# Patient Record
Sex: Male | Born: 1935
Health system: Southern US, Community
[De-identification: ages and names within clinical notes are randomized; demographics above are authoritative.]

## PROBLEM LIST (undated history)

## (undated) DIAGNOSIS — N529 Male erectile dysfunction, unspecified: Secondary | ICD-10-CM

## (undated) DIAGNOSIS — J302 Other seasonal allergic rhinitis: Secondary | ICD-10-CM

## (undated) DIAGNOSIS — R7303 Prediabetes: Secondary | ICD-10-CM

## (undated) DIAGNOSIS — F1021 Alcohol dependence, in remission: Secondary | ICD-10-CM

## (undated) DIAGNOSIS — R3912 Poor urinary stream: Secondary | ICD-10-CM

## (undated) DIAGNOSIS — K219 Gastro-esophageal reflux disease without esophagitis: Secondary | ICD-10-CM

## (undated) DIAGNOSIS — G4733 Obstructive sleep apnea (adult) (pediatric): Secondary | ICD-10-CM

## (undated) DIAGNOSIS — E7800 Pure hypercholesterolemia, unspecified: Secondary | ICD-10-CM

## (undated) DIAGNOSIS — R51 Headache: Secondary | ICD-10-CM

## (undated) DIAGNOSIS — M1990 Unspecified osteoarthritis, unspecified site: Secondary | ICD-10-CM

## (undated) DIAGNOSIS — I10 Essential (primary) hypertension: Secondary | ICD-10-CM

## (undated) DIAGNOSIS — R519 Headache, unspecified: Secondary | ICD-10-CM

## (undated) DIAGNOSIS — N401 Enlarged prostate with lower urinary tract symptoms: Secondary | ICD-10-CM

## (undated) DIAGNOSIS — Z9989 Dependence on other enabling machines and devices: Secondary | ICD-10-CM

## (undated) DIAGNOSIS — C61 Malignant neoplasm of prostate: Secondary | ICD-10-CM

## (undated) DIAGNOSIS — Z8739 Personal history of other diseases of the musculoskeletal system and connective tissue: Secondary | ICD-10-CM

## (undated) HISTORY — DX: Headache, unspecified: R51.9

## (undated) HISTORY — PX: PROSTATE BIOPSY: SHX241

## (undated) HISTORY — PX: NO PAST SURGERIES: SHX2092

## (undated) HISTORY — DX: Headache: R51

---

## 1998-04-21 ENCOUNTER — Inpatient Hospital Stay (HOSPITAL_COMMUNITY): Admission: EM | Admit: 1998-04-21 | Discharge: 1998-05-05 | Payer: Self-pay | Admitting: Emergency Medicine

## 1998-05-03 ENCOUNTER — Encounter: Payer: Self-pay | Admitting: Emergency Medicine

## 1998-05-03 ENCOUNTER — Encounter: Payer: Self-pay | Admitting: Cardiology

## 2005-04-02 ENCOUNTER — Ambulatory Visit (HOSPITAL_COMMUNITY): Admission: RE | Admit: 2005-04-02 | Discharge: 2005-04-02 | Payer: Self-pay | Admitting: Gastroenterology

## 2011-05-26 ENCOUNTER — Emergency Department (HOSPITAL_COMMUNITY): Payer: Medicare Other

## 2011-05-26 ENCOUNTER — Emergency Department (HOSPITAL_COMMUNITY)
Admission: EM | Admit: 2011-05-26 | Discharge: 2011-05-26 | Disposition: A | Discharge status: Home / Self Care | Payer: Medicare Other | Attending: Emergency Medicine | Admitting: Emergency Medicine

## 2011-05-26 DIAGNOSIS — S81809A Unspecified open wound, unspecified lower leg, initial encounter: Secondary | ICD-10-CM | POA: Insufficient documentation

## 2011-05-26 DIAGNOSIS — S81009A Unspecified open wound, unspecified knee, initial encounter: Secondary | ICD-10-CM | POA: Insufficient documentation

## 2011-05-26 DIAGNOSIS — S61209A Unspecified open wound of unspecified finger without damage to nail, initial encounter: Secondary | ICD-10-CM | POA: Insufficient documentation

## 2011-05-26 DIAGNOSIS — Z111 Encounter for screening for respiratory tuberculosis: Secondary | ICD-10-CM | POA: Insufficient documentation

## 2011-08-19 ENCOUNTER — Encounter: Payer: Self-pay | Admitting: *Deleted

## 2011-08-19 ENCOUNTER — Telehealth (HOSPITAL_COMMUNITY): Payer: Self-pay | Admitting: Emergency Medicine

## 2011-08-19 ENCOUNTER — Observation Stay (HOSPITAL_COMMUNITY)
Admission: EM | Admit: 2011-08-19 | Discharge: 2011-08-19 | Disposition: A | Discharge status: Home / Self Care | Payer: Medicare Other | Attending: Emergency Medicine | Admitting: Emergency Medicine

## 2011-08-19 ENCOUNTER — Emergency Department (HOSPITAL_COMMUNITY): Payer: Medicare Other

## 2011-08-19 DIAGNOSIS — E7800 Pure hypercholesterolemia, unspecified: Secondary | ICD-10-CM | POA: Insufficient documentation

## 2011-08-19 DIAGNOSIS — M109 Gout, unspecified: Principal | ICD-10-CM | POA: Insufficient documentation

## 2011-08-19 DIAGNOSIS — I10 Essential (primary) hypertension: Secondary | ICD-10-CM | POA: Insufficient documentation

## 2011-08-19 DIAGNOSIS — M2540 Effusion, unspecified joint: Secondary | ICD-10-CM | POA: Insufficient documentation

## 2011-08-19 HISTORY — DX: Essential (primary) hypertension: I10

## 2011-08-19 HISTORY — DX: Pure hypercholesterolemia, unspecified: E78.00

## 2011-08-19 LAB — SYNOVIAL CELL COUNT + DIFF, W/ CRYSTALS
Eosinophils-Synovial: 0 % (ref 0–1)
Lymphocytes-Synovial Fld: 1 % (ref 0–20)
Monocyte-Macrophage-Synovial Fluid: 16 % — ABNORMAL LOW (ref 50–90)
Neutrophil, Synovial: 83 % — ABNORMAL HIGH (ref 0–25)
WBC, Synovial: 17728 /mm3 — ABNORMAL HIGH (ref 0–200)

## 2011-08-19 LAB — BASIC METABOLIC PANEL
BUN: 28 mg/dL — ABNORMAL HIGH (ref 6–23)
CO2: 26 mEq/L (ref 19–32)
Calcium: 9.6 mg/dL (ref 8.4–10.5)
Chloride: 101 mEq/L (ref 96–112)
Creatinine, Ser: 1.51 mg/dL — ABNORMAL HIGH (ref 0.50–1.35)
GFR calc Af Amer: 51 mL/min — ABNORMAL LOW (ref >90)
GFR calc non Af Amer: 44 mL/min — ABNORMAL LOW (ref >90)
Glucose, Bld: 121 mg/dL — ABNORMAL HIGH (ref 70–99)
Potassium: 4.1 mEq/L (ref 3.5–5.1)
Sodium: 138 mEq/L (ref 135–145)

## 2011-08-19 LAB — CBC
HCT: 36.3 % — ABNORMAL LOW (ref 39.0–52.0)
Hemoglobin: 11.5 g/dL — ABNORMAL LOW (ref 13.0–17.0)
MCH: 27.9 pg (ref 26.0–34.0)
MCHC: 31.7 g/dL (ref 30.0–36.0)
MCV: 88.1 fL (ref 78.0–100.0)
Platelets: 308 10*3/uL (ref 150–400)
RBC: 4.12 MIL/uL — ABNORMAL LOW (ref 4.22–5.81)
RDW: 14 % (ref 11.5–15.5)
WBC: 9.8 10*3/uL (ref 4.0–10.5)

## 2011-08-19 LAB — GRAM STAIN

## 2011-08-19 MED ORDER — PREDNISONE 10 MG PO TABS: 50.0000 mg | ORAL_TABLET | Freq: Every day | ORAL | Status: AC

## 2011-08-19 MED ORDER — VANCOMYCIN HCL IN DEXTROSE 1-5 GM/200ML-% IV SOLN
1000.0000 mg | Freq: Once | INTRAVENOUS | Status: AC
  Administered 2011-08-19: 1000 mg via INTRAVENOUS
  Filled 2011-08-19: qty 200

## 2011-08-19 MED ORDER — METHYLPREDNISOLONE SODIUM SUCC 125 MG IJ SOLR
125.0000 mg | Freq: Once | INTRAMUSCULAR | Status: AC
  Administered 2011-08-19: 125 mg via INTRAVENOUS
  Filled 2011-08-19: qty 2

## 2011-08-19 MED ORDER — SODIUM CHLORIDE 0.9 % IV SOLN
999.0000 mL | INTRAVENOUS | Status: DC
  Administered 2011-08-19: 1000 mL via INTRAVENOUS

## 2011-08-19 MED ORDER — CLINDAMYCIN PHOSPHATE 600 MG/50ML IV SOLN: 600.0000 mg | Freq: Once | INTRAVENOUS | Status: DC

## 2011-08-19 MED ORDER — SODIUM CHLORIDE 0.9 % IV SOLN
INTRAVENOUS | Status: DC
  Administered 2011-08-19: 07:00:00 via INTRAVENOUS

## 2011-08-19 MED ORDER — ONDANSETRON HCL 4 MG/2ML IJ SOLN
4.0000 mg | Freq: Four times a day (QID) | INTRAMUSCULAR | Status: DC | PRN
  Administered 2011-08-19: 4 mg via INTRAVENOUS
  Filled 2011-08-19: qty 2

## 2011-08-19 MED ORDER — OXYCODONE-ACETAMINOPHEN 5-325 MG PO TABS: 1.0000 | ORAL_TABLET | Freq: Four times a day (QID) | ORAL | Status: AC | PRN

## 2011-08-19 MED ORDER — MORPHINE SULFATE 4 MG/ML IJ SOLN
4.0000 mg | INTRAMUSCULAR | Status: DC | PRN
  Administered 2011-08-19: 4 mg via INTRAVENOUS
  Filled 2011-08-19: qty 1

## 2011-08-19 MED ORDER — ACETAMINOPHEN 650 MG RE SUPP: 650.0000 mg | RECTAL | Status: DC | PRN

## 2011-08-19 MED ORDER — OXYCODONE-ACETAMINOPHEN 5-325 MG PO TABS
1.0000 | ORAL_TABLET | Freq: Once | ORAL | Status: AC
  Administered 2011-08-19: 1 via ORAL
  Filled 2011-08-19: qty 1

## 2011-08-19 NOTE — ED Notes (Signed)
Pt NAD, resp e/u, AOx4, pt states understanding of discharge instructions and denies pain at time of discharge.

## 2011-08-19 NOTE — ED Notes (Signed)
Patient with left knee swelling and pain.  Patient saw his MD and fluid was aspirated from the knee.  Patient told to come to ED if his knee was still bothering him

## 2011-08-19 NOTE — ED Notes (Signed)
Pt alert, interactive, calm, skin W&D, resps e/u, NAD, speaking in clear complete sentences, friend at Bs. Pt to xray via stretcher, pain med given.

## 2011-08-19 NOTE — ED Notes (Signed)
Meal ordered for pt 

## 2011-08-19 NOTE — ED Notes (Signed)
Family at bedside. Pt sitting on side of bed eating breakfast

## 2011-08-19 NOTE — Progress Notes (Signed)
Observation review completed. 

## 2011-08-19 NOTE — ED Notes (Signed)
Synovial fluid walked to lab by this nurse

## 2011-08-19 NOTE — ED Notes (Signed)
Patient states that he fell and twisted his knee a few days ago which is why he went to see his MD

## 2011-08-19 NOTE — ED Notes (Signed)
Ortho aware to apply knee immobilizer

## 2011-08-19 NOTE — ED Provider Notes (Signed)
History     CSN: [id number] Arrival date & time: 08/19/2011  1:14 AM   First MD Initiated Contact with Patient 08/19/11 0235      Chief Complaint  Patient presents with  . Joint Swelling     Patient is a 76 y.o. male presenting with knee pain.  Knee Pain This is a new problem. The current episode started yesterday. The problem occurs constantly. The problem has been gradually worsening. Associated symptoms include joint swelling. Pertinent negatives include no fever. The symptoms are aggravated by walking, bending and standing.  Knee Pain This is a new problem. The current episode started yesterday. The problem occurs constantly. The problem has been gradually worsening. The symptoms are aggravated by walking, bending and standing.  Patient reports hx of chronic of left knee pain associated with history of gout. States on Tuesday of this week he twisted his knee. The injury was minor and the patient was not evaluated. Patient noted increasing pain and swelling to the left knee. Was seen by Dr. Janae [facility] and patient states in the interim or fluid out of his knee. States pain worsened by Saturday morning, and was associated with increased swelling and redness which was new. Patient is able to bend the knee but states it is very painful.  Past Medical History  Diagnosis Date  . Hypertension   . Gout   . Hypercholesteremia     History reviewed. No pertinent past surgical history.  History reviewed. No pertinent family history.  History  Substance Use Topics  . Smoking status: Never Smoker   . Smokeless tobacco: Not on file  . Alcohol Use: No      Review of Systems  Constitutional: Negative.  Negative for fever.  HENT: Negative.   Eyes: Negative.   Respiratory: Negative.   Cardiovascular: Negative.   Gastrointestinal: Negative.   Genitourinary: Negative.   Musculoskeletal: Positive for joint swelling.  Skin: Negative.   Neurological: Negative.     Hematological: Negative.   Psychiatric/Behavioral: Negative.     Allergies  Review of patient's allergies indicates no known allergies.  Home Medications   Current Outpatient Rx  Name Route Sig Dispense Refill  . AMLODIPINE BESYLATE 5 MG PO TABS Oral Take 5 mg by mouth daily.      . ASPIRIN-SALICYLAMIDE-CAFFEINE 650-195-33.3 MG PO PACK Oral Take 1 packet by mouth daily as needed. For pain     . ATORVASTATIN CALCIUM 40 MG PO TABS Oral Take 40 mg by mouth daily.      [name] DICLOXACILLIN SODIUM 250 MG PO CAPS Oral Take 250 mg by mouth 4 (four) times daily.      [name] ESOMEPRAZOLE MAGNESIUM 40 MG PO CPDR Oral Take 40 mg by mouth daily before breakfast.      . EZETIMIBE-SIMVASTATIN 10-40 MG PO TABS Oral Take 1 tablet by mouth at bedtime.      [name] HYDROCODONE-ACETAMINOPHEN 5-500 MG PO TABS Oral Take 1 tablet by mouth every 6 (six) hours as needed. pain     . OLMESARTAN MEDOXOMIL-HCTZ 40-25 MG PO TABS Oral Take 1 tablet by mouth daily.        BP 138/80  Pulse 67  Temp(Src) 98.3 F (36.8 C) (Oral)  Resp 12  SpO2 97%  Physical Exam  Constitutional: He is oriented to person, place, and time. He appears well-developed and well-nourished.  HENT:  Head: Normocephalic and atraumatic.  Eyes: Conjunctivae are normal.  Neck: Neck supple.  Cardiovascular: Normal rate.   Pulmonary/Chest:  Effort normal.  Musculoskeletal:       Left knee: He exhibits swelling.       Legs: Neurological: He is alert and oriented to person, place, and time.  Skin: Skin is warm and dry.  Psychiatric: He has a normal mood and affect.    ED Course  Procedures Pt reports some relief of pain w/ PO med for pain. (L) knee Xr shows left knee effusion. Discussed patient with Dr. Dierdre Highman. Will consult with orthopedics for plan.  0430:  Spoke with Dr. Shon Baton. He has requested medical admission and agrees that he will be in to see patient this morning to tap patient's left knee. Request antibiotics be held until after procedure.  Discussed plan with Dr. Dierdre Highman. Will place patient in CDU Cellulitis Protocol until morning pending evaluation by Dr Shon Baton. Patient is agreeable with plan  0600:  Pt has rested in no acute distress.     Labs Reviewed - No data to display Dg Knee Complete 4 Views Left  08/19/2011  *RADIOLOGY REPORT*  Clinical Data: Left knee pain and swelling.  Recent knee fluid aspiration.  LEFT KNEE - COMPLETE 4+ VIEW  Comparison: None.  Findings: There is no evidence of fracture or dislocation.  The joint spaces are preserved.  No significant degenerative change is seen; the patellofemoral joint is grossly unremarkable in appearance.  A Pellegrini-Stieda lesion is noted.  A moderate joint effusion is seen.  The visualized soft tissues are otherwise grossly unremarkable in appearance.  IMPRESSION:  1.  No evidence of fracture or dislocation. 2.  Moderate joint effusion noted. 3.  Pellegrini-Stieda lesion noted, likely reflecting remote medial collateral ligament injury.  Original Report Authenticated By: Tonia Ghent, M.D.     No diagnosis found.    MDM  Cellulitis vs septic (L) knee joint (Pending eval by ortho this am.      Leanne Chang, NP 08/19/11 [phone]  Medical screening examination/treatment/procedure(s) were conducted as a shared visit with non-physician practitioner(s) and myself.  I personally evaluated the patient during the encounter. Has red tender knee that appears cellulitic s/p arthrocentesis, pending ortho eval  Sunnie Nielsen, MD 08/19/11 [phone]

## 2011-08-19 NOTE — ED Notes (Signed)
Dr Shon Baton at bedside to aspirate left knee

## 2011-08-19 NOTE — Consult Note (Signed)
No primary provider on file. Chief Complaint: right knee pain   History: 76 yo old male with increasing knee pain and swelling for several days.  Patient had knee aspirated Friday at his PCP's office.  Reports increased pain since aspiration.  Denies fevers/chills.  Pain with ROM but not excruciating.  Asked to see patient for question of septic joint.  Past Medical History  Diagnosis Date  . Hypertension   . Gout   . Hypercholesteremia     No Known Allergies  No current facility-administered medications on file prior to encounter.   No current outpatient prescriptions on file prior to encounter.    Physical Exam: Filed Vitals:   08/19/11 0735  BP:   Pulse: 65  Temp: 98.2 F (36.8 C)  Resp: 20  No SOB/CP Abd - soft/NT NVI - 2+ DP/PT pulses EHL/TA/GA intact bilateral Compartments soft/NT Pain with PROM/AROM  No erythema.  Positive swelling   Image: Dg Knee Complete 4 Views Left  08/19/2011  *RADIOLOGY REPORT*  Clinical Data: Left knee pain and swelling.  Recent knee fluid aspiration.  LEFT KNEE - COMPLETE 4+ VIEW  Comparison: None.  Findings: There is no evidence of fracture or dislocation.  The joint spaces are preserved.  No significant degenerative change is seen; the patellofemoral joint is grossly unremarkable in appearance.  A Pellegrini-Stieda lesion is noted.  A moderate joint effusion is seen.  The visualized soft tissues are otherwise grossly unremarkable in appearance.  IMPRESSION:  1.  No evidence of fracture or dislocation. 2.  Moderate joint effusion noted. 3.  Pellegrini-Stieda lesion noted, likely reflecting remote medial collateral ligament injury.  Original Report Authenticated By: Tonia Ghent, M.D.    A/P: Under sterile conditions the knee was aspirated.  Collected approximately 50 cc of blood tinged synovial fluid.  No gross pus.   Decreased pain after aspiration.   Plan:  Ok to start IV Abx Knee immobilizer and ace wrap for comfort WBAT Await  results of aspiration - ? Gout.  Doesn't appear to be septic joint. Medical team to admit.

## 2011-08-19 NOTE — ED Provider Notes (Signed)
7:22 AM Patient is in CDU this morning under observation, cellulitis protocol.  Pt reports his pain is well controlled as long as he doesn't attempt to walk or straighten his left leg.  On exam, pt is A&Ox4, NAD, RRR, CTAB, left knee edematous, erythematous, warm, ttp, decreased extension, distal pulses intact.  Plan is for Dr Shon Baton to see patient this morning and aspirate joint, concern for septic joint.    Patient seen in ED, joint aspirated by Dr Shon Baton.  Discussed gram stain results with Dr Shon Baton.  Plan is for d/c home with treatment for gout.  Pt to follow up with PCP Hayden Rasmussen, NP.    Pt is aware of results and agrees with plan.  He states he has gout flares approximately once a month and they seem to be getting worse.  I have advised that he discussed medications for better control with his PCP when he is not having an acute flare.  Pt verbalizes understanding.    Dillard Cannon [place], [place] 08/19/11 1639  Medical screening examination/treatment/procedure(s) were conducted as a shared visit with non-physician practitioner(s) and myself.  I personally evaluated the patient during the encounter.   Sunnie Nielsen, MD 08/20/11 [phone]

## 2011-08-22 LAB — BODY FLUID CULTURE: Culture: NO GROWTH

## 2011-09-17 DIAGNOSIS — Z8739 Personal history of other diseases of the musculoskeletal system and connective tissue: Secondary | ICD-10-CM

## 2011-09-17 HISTORY — DX: Personal history of other diseases of the musculoskeletal system and connective tissue: Z87.39

## 2011-10-03 ENCOUNTER — Emergency Department (HOSPITAL_COMMUNITY)
Admission: EM | Admit: 2011-10-03 | Discharge: 2011-10-03 | Disposition: A | Discharge status: Home / Self Care | Payer: Medicare Other | Attending: Emergency Medicine | Admitting: Emergency Medicine

## 2011-10-03 ENCOUNTER — Encounter (HOSPITAL_COMMUNITY): Payer: Self-pay | Admitting: Emergency Medicine

## 2011-10-03 DIAGNOSIS — E785 Hyperlipidemia, unspecified: Secondary | ICD-10-CM | POA: Insufficient documentation

## 2011-10-03 DIAGNOSIS — I10 Essential (primary) hypertension: Secondary | ICD-10-CM | POA: Insufficient documentation

## 2011-10-03 DIAGNOSIS — M7989 Other specified soft tissue disorders: Secondary | ICD-10-CM | POA: Insufficient documentation

## 2011-10-03 DIAGNOSIS — M25579 Pain in unspecified ankle and joints of unspecified foot: Secondary | ICD-10-CM | POA: Insufficient documentation

## 2011-10-03 DIAGNOSIS — M109 Gout, unspecified: Secondary | ICD-10-CM | POA: Insufficient documentation

## 2011-10-03 MED ORDER — PREDNISONE 20 MG PO TABS: ORAL_TABLET | ORAL | Status: AC

## 2011-10-03 MED ORDER — PREDNISONE 20 MG PO TABS
60.0000 mg | ORAL_TABLET | Freq: Once | ORAL | Status: AC
  Administered 2011-10-03: 60 mg via ORAL
  Filled 2011-10-03: qty 3

## 2011-10-03 MED ORDER — OXYCODONE-ACETAMINOPHEN 5-325 MG PO TABS
1.0000 | ORAL_TABLET | Freq: Once | ORAL | Status: AC
  Administered 2011-10-03: 1 via ORAL
  Filled 2011-10-03: qty 1

## 2011-10-03 MED ORDER — OXYCODONE-ACETAMINOPHEN 5-325 MG PO TABS: 1.0000 | ORAL_TABLET | Freq: Four times a day (QID) | ORAL | Status: AC | PRN

## 2011-10-03 NOTE — ED Provider Notes (Signed)
Medical screening examination/treatment/procedure(s) were performed by non-physician practitioner and as supervising physician I was immediately available for consultation/collaboration.   Gwyneth Sprout, MD 10/03/11 [phone]

## 2011-10-03 NOTE — ED Notes (Signed)
C/o R ankle pain x 3 days.  Pt states it is his gout.  No known injury.

## 2011-10-03 NOTE — ED Provider Notes (Signed)
History     CSN: [id number]  Arrival date & time 10/03/11  0012   First MD Initiated Contact with Patient 10/03/11 [phone]      Chief Complaint  Patient presents with  . Gout    (Consider location/radiation/quality/duration/timing/severity/associated sxs/prior treatment) HPI Comments: Started with gout-like symptoms of his right ankle, redness, pain and swelling.  Sensitivity to touch has tried Epsom salts soaks without relief  The history is provided by the patient.    Past Medical History  Diagnosis Date  . Hypertension   . Gout   . Hypercholesteremia     History reviewed. No pertinent past surgical history.  No family history on file.  History  Substance Use Topics  . Smoking status: Never Smoker   . Smokeless tobacco: Not on file  . Alcohol Use: No      Review of Systems  Constitutional: Negative for fever and chills.  Cardiovascular: Negative for chest pain and leg swelling.  Skin: Positive for color change. Negative for rash.    Allergies  Review of patient's allergies indicates no known allergies.  Home Medications   Current Outpatient Rx  Name Route Sig Dispense Refill  . ALLOPURINOL 100 MG PO TABS Oral Take 100 mg by mouth 2 (two) times daily.    [name] AMLODIPINE BESYLATE 5 MG PO TABS Oral Take 5 mg by mouth daily.      . ASPIRIN-SALICYLAMIDE-CAFFEINE 650-195-33.3 MG PO PACK Oral Take 1 packet by mouth daily as needed. For pain    . ATORVASTATIN CALCIUM 40 MG PO TABS Oral Take 40 mg by mouth daily.      [name] ESOMEPRAZOLE MAGNESIUM 40 MG PO CPDR Oral Take 40 mg by mouth daily before breakfast.      . OLMESARTAN MEDOXOMIL-HCTZ 40-25 MG PO TABS Oral Take 1 tablet by mouth daily.      . OXYCODONE-ACETAMINOPHEN 5-325 MG PO TABS Oral Take 1 tablet by mouth every 6 (six) hours as needed. pain    . OXYCODONE-ACETAMINOPHEN 5-325 MG PO TABS Oral Take 1-2 tablets by mouth every 6 (six) hours as needed for pain. 20 tablet 0  . PREDNISONE 20 MG PO TABS  3 Tabs PO Days  1-3, then 2 tabs PO Days 4-6, then 1 tab PO Day 7-9, then Half Tab PO Day 10-12 20 tablet 0    BP 118/71  Pulse 103  Temp(Src) 98.3 F (36.8 C) (Oral)  Resp 18  SpO2 97%  Physical Exam  Constitutional: He is oriented to person, place, and time. He appears well-developed and well-nourished.  HENT:  Head: Normocephalic.  Eyes: Pupils are equal, round, and reactive to light.  Neck: Normal range of motion.  Cardiovascular: Normal rate.   Pulmonary/Chest: Effort normal.  Musculoskeletal:       Right medial ankle, read, slight swelling, tender to touch.  Distal pulses intact.  Cap refill less than 3 seconds full range of motion of the ankle joint  Neurological: He is alert and oriented to person, place, and time.  Skin: Skin is warm and dry.  Psychiatric: He has a normal mood and affect.    ED Course  Procedures (including critical care time)  Labs Reviewed - No data to display No results found.   1. Gout     Gout, flare, we'll treat with prednisone and Percocet  MDM  Gout        Arman Filter, NP 10/03/11 0121  Arman Filter, NP 10/03/11 [phone]

## 2011-10-28 ENCOUNTER — Ambulatory Visit
Admission: RE | Admit: 2011-10-28 | Discharge: 2011-10-28 | Disposition: A | Discharge status: Home / Self Care | Payer: Medicare Other | Source: Ambulatory Visit | Attending: Family Medicine | Admitting: Family Medicine

## 2011-10-28 ENCOUNTER — Other Ambulatory Visit: Payer: Self-pay | Admitting: Family Medicine

## 2011-10-28 DIAGNOSIS — M25551 Pain in right hip: Secondary | ICD-10-CM

## 2011-10-28 DIAGNOSIS — M25552 Pain in left hip: Secondary | ICD-10-CM

## 2014-07-25 ENCOUNTER — Encounter (HOSPITAL_COMMUNITY): Payer: Self-pay | Admitting: Emergency Medicine

## 2014-07-25 ENCOUNTER — Emergency Department (HOSPITAL_COMMUNITY)

## 2014-07-25 ENCOUNTER — Emergency Department (HOSPITAL_COMMUNITY)
Admission: EM | Admit: 2014-07-25 | Discharge: 2014-07-25 | Disposition: A | Discharge status: Home / Self Care | Attending: Emergency Medicine | Admitting: Emergency Medicine

## 2014-07-25 DIAGNOSIS — R112 Nausea with vomiting, unspecified: Secondary | ICD-10-CM | POA: Diagnosis not present

## 2014-07-25 DIAGNOSIS — I10 Essential (primary) hypertension: Secondary | ICD-10-CM | POA: Diagnosis not present

## 2014-07-25 DIAGNOSIS — R42 Dizziness and giddiness: Secondary | ICD-10-CM | POA: Insufficient documentation

## 2014-07-25 DIAGNOSIS — M109 Gout, unspecified: Secondary | ICD-10-CM | POA: Insufficient documentation

## 2014-07-25 DIAGNOSIS — Z79899 Other long term (current) drug therapy: Secondary | ICD-10-CM | POA: Diagnosis not present

## 2014-07-25 DIAGNOSIS — E780 Pure hypercholesterolemia: Secondary | ICD-10-CM | POA: Diagnosis not present

## 2014-07-25 LAB — URINALYSIS, ROUTINE W REFLEX MICROSCOPIC
Bilirubin Urine: NEGATIVE
GLUCOSE, UA: NEGATIVE mg/dL
Ketones, ur: NEGATIVE mg/dL
Leukocytes, UA: NEGATIVE
Nitrite: NEGATIVE
PH: 6.5 (ref 5.0–8.0)
Protein, ur: 100 mg/dL — AB
SPECIFIC GRAVITY, URINE: 1.02 (ref 1.005–1.030)
Urobilinogen, UA: 0.2 mg/dL (ref 0.0–1.0)

## 2014-07-25 LAB — CBC WITH DIFFERENTIAL/PLATELET
BASOS ABS: 0 10*3/uL (ref 0.0–0.1)
BASOS PCT: 1 % (ref 0–1)
EOS PCT: 6 % — AB (ref 0–5)
Eosinophils Absolute: 0.4 10*3/uL (ref 0.0–0.7)
HCT: 38 % — ABNORMAL LOW (ref 39.0–52.0)
Hemoglobin: 12.5 g/dL — ABNORMAL LOW (ref 13.0–17.0)
LYMPHS PCT: 19 % (ref 12–46)
Lymphs Abs: 1.2 10*3/uL (ref 0.7–4.0)
MCH: 28.5 pg (ref 26.0–34.0)
MCHC: 32.9 g/dL (ref 30.0–36.0)
MCV: 86.6 fL (ref 78.0–100.0)
Monocytes Absolute: 0.5 10*3/uL (ref 0.1–1.0)
Monocytes Relative: 8 % (ref 3–12)
Neutro Abs: 4 10*3/uL (ref 1.7–7.7)
Neutrophils Relative %: 66 % (ref 43–77)
PLATELETS: 230 10*3/uL (ref 150–400)
RBC: 4.39 MIL/uL (ref 4.22–5.81)
RDW: 15.6 % — AB (ref 11.5–15.5)
WBC: 6 10*3/uL (ref 4.0–10.5)

## 2014-07-25 LAB — COMPREHENSIVE METABOLIC PANEL
ALBUMIN: 3.6 g/dL (ref 3.5–5.2)
ALT: 15 U/L (ref 0–53)
AST: 15 U/L (ref 0–37)
Alkaline Phosphatase: 94 U/L (ref 39–117)
Anion gap: 11 (ref 5–15)
BUN: 24 mg/dL — ABNORMAL HIGH (ref 6–23)
CALCIUM: 8.9 mg/dL (ref 8.4–10.5)
CO2: 25 mEq/L (ref 19–32)
Chloride: 103 mEq/L (ref 96–112)
Creatinine, Ser: 1.73 mg/dL — ABNORMAL HIGH (ref 0.50–1.35)
GFR calc Af Amer: 42 mL/min — ABNORMAL LOW (ref >90)
GFR, EST NON AFRICAN AMERICAN: 36 mL/min — AB (ref >90)
Glucose, Bld: 94 mg/dL (ref 70–99)
Potassium: 4.3 mEq/L (ref 3.7–5.3)
SODIUM: 139 meq/L (ref 137–147)
TOTAL PROTEIN: 7.2 g/dL (ref 6.0–8.3)
Total Bilirubin: 0.5 mg/dL (ref 0.3–1.2)

## 2014-07-25 LAB — URINE MICROSCOPIC-ADD ON

## 2014-07-25 LAB — LIPASE, BLOOD: Lipase: 24 U/L (ref 11–59)

## 2014-07-25 LAB — TROPONIN I

## 2014-07-25 NOTE — ED Notes (Signed)
Had dizziness while sitting.  Did not pass out.  Drink water and vomited immediately.  bp dropped.

## 2014-07-25 NOTE — ED Provider Notes (Signed)
CSN: [id number]     Arrival date & time 07/25/14  1028 History   First MD Initiated Contact with Patient 07/25/14 1039     Chief Complaint  Patient presents with  . Emesis  . Dizziness     (Consider location/radiation/quality/duration/timing/severity/associated sxs/prior Treatment) Patient is a 79 y.o. male presenting with vomiting and dizziness.  Emesis Associated symptoms: no abdominal pain, no arthralgias, no headaches and no sore throat   Dizziness Associated symptoms: nausea and vomiting   Associated symptoms: no chest pain, no headaches and no shortness of breath     Jorge Martin is a 79 y.o. male presenting for evaluation of an episode of vomiting and dizziness occuring around 9 am this morning.  He was sitting in a chair when he started feeling dizzy, described as "felt I would pass out". He became nauseated,  Drank a sip of water and had emesis x 1.  The dizziness lasted about 15 minutes and he now feels back to his baseline, denying symptoms at present.  He denies chest pain, shortness of breath, abdominal pain, headache, vision changes.  He ate oatmeal for breakfast this morning without symptoms until later.  He is treated for hypertension and has had his medications this morning.      Past Medical History  Diagnosis Date  . Hypertension   . Gout   . Hypercholesteremia    History reviewed. No pertinent past surgical history. History reviewed. No pertinent family history. History  Substance Use Topics  . Smoking status: Never Smoker   . Smokeless tobacco: Not on file  . Alcohol Use: No    Review of Systems  Constitutional: Negative for fever.  HENT: Negative for congestion and sore throat.   Eyes: Negative.   Respiratory: Negative for chest tightness and shortness of breath.   Cardiovascular: Negative for chest pain.  Gastrointestinal: Positive for nausea and vomiting. Negative for abdominal pain.  Genitourinary: Negative.   Musculoskeletal: Negative for  joint swelling, arthralgias and neck pain.  Skin: Negative.  Negative for rash and wound.  Neurological: Positive for light-headedness. Negative for dizziness, weakness, numbness and headaches.  Psychiatric/Behavioral: Negative.       Allergies  Review of patient's allergies indicates no known allergies.  Home Medications   Prior to Admission medications   Medication Sig Start Date End Date Taking? Authorizing Provider  allopurinol (ZYLOPRIM) 100 MG tablet Take 100 mg by mouth 2 (two) times daily.    Historical Provider, MD  amLODipine (NORVASC) 5 MG tablet Take 5 mg by mouth daily.      Historical Provider, MD  Aspirin-Salicylamide-Caffeine (BC FAST PAIN RELIEF) 650-195-33.3 MG PACK Take 1 packet by mouth daily as needed. For pain    Historical Provider, MD  atorvastatin (LIPITOR) 40 MG tablet Take 40 mg by mouth daily.      Historical Provider, MD  esomeprazole (NEXIUM) 40 MG capsule Take 40 mg by mouth daily before breakfast.      Historical Provider, MD  olmesartan-hydrochlorothiazide (BENICAR HCT) 40-25 MG per tablet Take 1 tablet by mouth daily.      Historical Provider, MD  oxyCODONE-acetaminophen (PERCOCET) 5-325 MG per tablet Take 1 tablet by mouth every 6 (six) hours as needed. pain    Historical Provider, MD   BP 103/62 mmHg  Pulse 64  Temp(Src) 97.9 F (36.6 C) (Oral)  Resp 18  Ht 5\' 6"  (1.676 m)  Wt 188 lb (85.276 kg)  BMI 30.36 kg/m2  SpO2 94% Physical Exam  Constitutional: He  appears well-developed and well-nourished.  HENT:  Head: Normocephalic and atraumatic.  Eyes: Conjunctivae are normal.  Neck: Normal range of motion.  Cardiovascular: Normal rate, regular rhythm, normal heart sounds and intact distal pulses.   Pulmonary/Chest: Effort normal and breath sounds normal. He has no wheezes.  Abdominal: Soft. Bowel sounds are normal. There is no tenderness.  Musculoskeletal: Normal range of motion.  Neurological: He is alert.  Skin: Skin is warm and dry.   Psychiatric: He has a normal mood and affect.  Nursing note and vitals reviewed.   ED Course  Procedures (including critical care time) Labs Review Labs Reviewed  CBC WITH DIFFERENTIAL  COMPREHENSIVE METABOLIC PANEL  LIPASE, BLOOD  URINALYSIS, ROUTINE W REFLEX MICROSCOPIC  TROPONIN I    Imaging Review Dg Chest 2 View  07/25/2014   CLINICAL DATA:  Dizziness, nausea, vomiting since 9 o'clock a.m. Blood pressure dropped today. 05/26/2011  EXAM: CHEST  2 VIEW  COMPARISON:  05/26/2011  FINDINGS: The heart is mildly enlarged. There is elevation of right hemidiaphragm, more prominent than on the prior study. There are no focal consolidations or pleural effusions. No pulmonary edema. No free intraperitoneal air diaphragm.  IMPRESSION: Cardiomegaly.   Electronically Signed   By: Shon Hale M.D.   On: 07/25/2014 11:42     EKG Interpretation   Date/Time:  Monday July 25 2014 11:08:46 EST Ventricular Rate:  67 PR Interval:  169 QRS Duration: 70 QT Interval:  419 QTC Calculation: 442 R Axis:   53 Text Interpretation:  Sinus rhythm Anterior infarct, old Borderline  repolarization abnormality Baseline wander in lead(s) V4 No old tracing to  compare Confirmed by GOLDSTON  MD, SCOTT ([id number]) on 07/25/2014 11:44:08 AM      MDM   Final diagnoses:  Dizziness    Pt discussed with Dr. Regenia Skeeter, results reviewed, shared with patient.  He remains sx free during ed visit.  He and guards at bedside confirm they will have physician visit to their unit tomorrow, advised f/u for recheck with him at that time.  Pt with transient lightheadedness, emesis x 1 with reported bp drop.  Suspect vasovagal event with emesis.  Dizziness of unclear etiology, but transient and resolved prior to arriving in ed.  The patient appears reasonably screened and/or stabilized for discharge and I doubt any other medical condition or other [facility] requiring further screening, evaluation, or treatment in the ED at this time  prior to discharge.     Evalee Jefferson, PA-C 07/25/14 2119  Ephraim Hamburger, MD 07/26/14 [phone]

## 2014-07-25 NOTE — Discharge Instructions (Signed)
Dizziness °Dizziness is a common problem. It is a feeling of unsteadiness or light-headedness. You may feel like you are about to faint. Dizziness can lead to injury if you stumble or fall. A person of any age group can suffer from dizziness, but dizziness is more common in older adults. °CAUSES  °Dizziness can be caused by many different things, including: °· Middle ear problems. °· Standing for too long. °· Infections. °· An allergic reaction. °· Aging. °· An emotional response to something, such as the sight of blood. °· Side effects of medicines. °· Tiredness. °· Problems with circulation or blood pressure. °· Excessive use of alcohol or medicines, or illegal drug use. °· Breathing too fast (hyperventilation). °· An irregular heart rhythm (arrhythmia). °· A low red blood cell count (anemia). °· Pregnancy. °· Vomiting, diarrhea, fever, or other illnesses that cause body fluid loss (dehydration). °· Diseases or conditions such as Parkinson's disease, high blood pressure (hypertension), diabetes, and thyroid problems. °· Exposure to extreme heat. °DIAGNOSIS  °Your health care provider will ask about your symptoms, perform a physical exam, and perform an electrocardiogram (ECG) to record the electrical activity of your heart. Your health care provider may also perform other heart or blood tests to determine the cause of your dizziness. These may include: °· Transthoracic echocardiogram (TTE). During echocardiography, sound waves are used to evaluate how blood flows through your heart. °· Transesophageal echocardiogram (TEE). °· Cardiac monitoring. This allows your health care provider to monitor your heart rate and rhythm in real time. °· Holter monitor. This is a portable device that records your heartbeat and can help diagnose heart arrhythmias. It allows your health care provider to track your heart activity for several days if needed. °· Stress tests by exercise or by giving medicine that makes the heart beat  faster. °TREATMENT  °Treatment of dizziness depends on the cause of your symptoms and can vary greatly. °HOME CARE INSTRUCTIONS  °· Drink enough fluids to keep your urine clear or pale yellow. This is especially important in very hot weather. In older adults, it is also important in cold weather. °· Take your medicine exactly as directed if your dizziness is caused by medicines. When taking blood pressure medicines, it is especially important to get up slowly. °¨ Rise slowly from chairs and steady yourself until you feel okay. °¨ In the morning, first sit up on the side of the bed. When you feel okay, stand slowly while holding onto something until you know your balance is fine. °· Move your legs often if you need to stand in one place for a long time. Tighten and relax your muscles in your legs while standing. °· Have someone stay with you for 1-2 days if dizziness continues to be a problem. Do this until you feel you are well enough to stay alone. Have the person call your health care provider if he or she notices changes in you that are concerning. °· Do not drive or use heavy machinery if you feel dizzy. °· Do not drink alcohol. °SEEK IMMEDIATE MEDICAL CARE IF:  °· Your dizziness or light-headedness gets worse. °· You feel nauseous or vomit. °· You have problems talking, walking, or using your arms, hands, or legs. °· You feel weak. °· You are not thinking clearly or you have trouble forming sentences. It may take a friend or family member to notice this. °· You have chest pain, abdominal pain, shortness of breath, or sweating. °· Your vision changes. °· You notice   any bleeding.  You have side effects from medicine that seems to be getting worse rather than better. MAKE SURE YOU:   Understand these instructions.  Will watch your condition.  Will get help right away if you are not doing well or get worse. Document Released: 02/26/2001 Document Revised: 09/07/2013 Document Reviewed: 03/22/2011 [facility]  Patient Information 2015 [place], [place]. This information is not intended to replace advice given to you by your health care provider. Make sure you discuss any questions you have with your health care provider.   Your lab tests, ekg and chest xray is normal today.  Return here for any worsened or new symptoms.  Your heart, lungs and basic blood tests today are ok.

## 2017-01-30 ENCOUNTER — Ambulatory Visit (INDEPENDENT_AMBULATORY_CARE_PROVIDER_SITE_OTHER): Payer: Self-pay | Admitting: Family Medicine

## 2017-01-30 ENCOUNTER — Encounter: Payer: Self-pay | Admitting: Family Medicine

## 2017-01-30 VITALS — BP 150/82 | HR 68 | Temp 98.4°F | Resp 16 | Ht 66.0 in | Wt 189.0 lb

## 2017-01-30 DIAGNOSIS — K219 Gastro-esophageal reflux disease without esophagitis: Secondary | ICD-10-CM

## 2017-01-30 DIAGNOSIS — H9193 Unspecified hearing loss, bilateral: Secondary | ICD-10-CM

## 2017-01-30 DIAGNOSIS — H409 Unspecified glaucoma: Secondary | ICD-10-CM

## 2017-01-30 DIAGNOSIS — E785 Hyperlipidemia, unspecified: Secondary | ICD-10-CM

## 2017-01-30 DIAGNOSIS — Z23 Encounter for immunization: Secondary | ICD-10-CM

## 2017-01-30 DIAGNOSIS — I10 Essential (primary) hypertension: Secondary | ICD-10-CM

## 2017-01-30 DIAGNOSIS — Z131 Encounter for screening for diabetes mellitus: Secondary | ICD-10-CM

## 2017-01-30 DIAGNOSIS — R339 Retention of urine, unspecified: Secondary | ICD-10-CM

## 2017-01-30 LAB — COMPLETE METABOLIC PANEL WITH GFR
ALBUMIN: 4.6 g/dL (ref 3.6–5.1)
ALK PHOS: 103 U/L (ref 40–115)
ALT: 16 U/L (ref 9–46)
AST: 22 U/L (ref 10–35)
BUN: 15 mg/dL (ref 7–25)
CO2: 16 mmol/L — ABNORMAL LOW (ref 20–31)
Calcium: 9 mg/dL (ref 8.6–10.3)
Chloride: 107 mmol/L (ref 98–110)
Creat: 1.26 mg/dL — ABNORMAL HIGH (ref 0.70–1.11)
GFR, EST AFRICAN AMERICAN: 62 mL/min (ref >=60)
GFR, EST NON AFRICAN AMERICAN: 54 mL/min — AB (ref >=60)
Glucose, Bld: 103 mg/dL — ABNORMAL HIGH (ref 65–99)
POTASSIUM: 4.6 mmol/L (ref 3.5–5.3)
Sodium: 138 mmol/L (ref 135–146)
Total Bilirubin: 0.9 mg/dL (ref 0.2–1.2)
Total Protein: 7.5 g/dL (ref 6.1–8.1)

## 2017-01-30 LAB — CBC WITH DIFFERENTIAL/PLATELET
BASOS ABS: 41 {cells}/uL (ref 0–200)
Basophils Relative: 1 %
EOS PCT: 9 %
Eosinophils Absolute: 369 cells/uL (ref 15–500)
HCT: 42.3 % (ref 38.5–50.0)
HEMOGLOBIN: 13.4 g/dL (ref 13.2–17.1)
LYMPHS PCT: 46 %
Lymphs Abs: 1886 cells/uL (ref 850–3900)
MCH: 28.4 pg (ref 27.0–33.0)
MCHC: 31.7 g/dL — AB (ref 32.0–36.0)
MCV: 89.6 fL (ref 80.0–100.0)
MPV: 9.5 fL (ref 7.5–12.5)
Monocytes Absolute: 369 cells/uL (ref 200–950)
Monocytes Relative: 9 %
NEUTROS PCT: 35 %
Neutro Abs: 1435 cells/uL — ABNORMAL LOW (ref 1500–7800)
Platelets: 266 10*3/uL (ref 140–400)
RBC: 4.72 MIL/uL (ref 4.20–5.80)
RDW: 16 % — AB (ref 11.0–15.0)
WBC: 4.1 10*3/uL (ref 3.8–10.8)

## 2017-01-30 LAB — POCT URINALYSIS DIP (DEVICE)
BILIRUBIN URINE: NEGATIVE
Glucose, UA: NEGATIVE mg/dL
HGB URINE DIPSTICK: NEGATIVE
Ketones, ur: NEGATIVE mg/dL
Leukocytes, UA: NEGATIVE
Nitrite: NEGATIVE
Protein, ur: 30 mg/dL — AB
Specific Gravity, Urine: 1.02 (ref 1.005–1.030)
Urobilinogen, UA: 0.2 mg/dL (ref 0.0–1.0)
pH: 6.5 (ref 5.0–8.0)

## 2017-01-30 LAB — POCT GLYCOSYLATED HEMOGLOBIN (HGB A1C): Hemoglobin A1C: 6

## 2017-01-30 MED ORDER — ZOSTER VACCINE LIVE 19400 UNT/0.65ML ~~LOC~~ SUSR
0.6500 mL | Freq: Once | SUBCUTANEOUS | 0 refills | Status: AC
Start: 2017-01-30 — End: 2017-01-30

## 2017-01-30 MED ORDER — TAMSULOSIN HCL 0.4 MG PO CAPS: 0.4000 mg | ORAL_CAPSULE | Freq: Every day | ORAL | 1 refills | Status: DC

## 2017-01-30 MED ORDER — ESOMEPRAZOLE MAGNESIUM 40 MG PO CPDR: 40.0000 mg | DELAYED_RELEASE_CAPSULE | Freq: Every day | ORAL | 1 refills | Status: DC

## 2017-01-30 MED ORDER — GABAPENTIN 300 MG PO CAPS: 300.0000 mg | ORAL_CAPSULE | Freq: Three times a day (TID) | ORAL | 3 refills | Status: DC

## 2017-01-30 MED ORDER — ATORVASTATIN CALCIUM 20 MG PO TABS: 20.0000 mg | ORAL_TABLET | Freq: Every day | ORAL | 1 refills | Status: DC

## 2017-01-30 MED ORDER — ZOSTER VACCINE LIVE 19400 UNT/0.65ML ~~LOC~~ SUSR: 0.6500 mL | Freq: Once | SUBCUTANEOUS | 0 refills | Status: DC

## 2017-01-30 MED ORDER — AMLODIPINE BESYLATE 5 MG PO TABS
10.0000 mg | ORAL_TABLET | Freq: Every day | ORAL | 1 refills | Status: DC
Start: 2017-01-30 — End: 2017-02-04

## 2017-01-30 NOTE — Progress Notes (Signed)
Patient ID: Jorge Martin, male    DOB: August 13, 1935, 82 y.o.   MRN: [id number]  PCP: Scot Jun, FNP  Chief Complaint  Patient presents with  . Establish Care  . Medication Refill    AMODIPINE    Subjective:  HPI Jorge Martin is a 82 y.o. male presents to establish care. History include open angle glaucoma and hypertension, hyperlipidemia His primary care over the last several years has taken place with the [facility]. He has been incarcerated for several years.   Medical history includes: Urinary retention, Hypertension, Glaucoma, recent Herpetic Zoster of left face,   shingle left side of face and continues to have residual itching.  Hypertension Reports to his knowledge his blood pressure is well controlled.  Antihypertensive therapy is Amlodipine 10 mg. He denies any associated headaches, dizziness, shortness of breath, and or chest pain. He is also treated for hyperlipemia and takes Lipitor 20 mg daily.  Glaucoma Previously under the care of [facility] during incarceration. He currently lives in [place] and requests a referral to a local opthalmology specialist. Reports during last visit he was noted to have IOP and was prescribed  Latanoprost. He was last evaluated on 09/23/2016 by Dr. Lovie Macadamia.   Other chronic problems:  Reports that he suffers from Acid Reflux and symptoms are controlled with Nexium. Denies any associated cough, throat burning, nausea, or vomiting. He reports recent colonoscopy negative of abnormal findings.  Chronic urinary retention mixed with nocturia over the last two years. These symptoms are managed with Flomax.  Reports numbness and tingling in hands and toes. Chronic problem. No known hisotry of diabetes or prediabetes.   Social History   Social History  . Marital status: Married    Spouse name: N/A  . Number of children: N/A  . Years of education: N/A   Occupational History  . Not on file.    Social History Main Topics  . Smoking status: Never Smoker  . Smokeless tobacco: Never Used  . Alcohol use No  . Drug use: No  . Sexual activity: Not on file   Other Topics Concern  . Not on file   Social History Narrative  . No narrative on file    History reviewed. No pertinent family history.   Review of Systems See HPI   Prior to Admission medications   Medication Sig Start Date End Date Taking? Authorizing Provider  amLODipine (NORVASC) 5 MG tablet Take 10 mg by mouth daily.    Yes [provider]  atorvastatin (LIPITOR) 20 MG tablet Take 20 mg by mouth daily.   Yes [provider]  esomeprazole (NEXIUM) 40 MG capsule Take 40 mg by mouth daily before breakfast.     Yes [provider]  tamsulosin (FLOMAX) 0.4 MG CAPS capsule Take 0.4 mg by mouth.   Yes [provider]  allopurinol (ZYLOPRIM) 100 MG tablet Take 100 mg by mouth 2 (two) times daily.    [provider]  cyproheptadine (PERIACTIN) 4 MG tablet Take 4 mg by mouth at bedtime.    [provider]  lisinopril (PRINIVIL,ZESTRIL) 20 MG tablet Take 20 mg by mouth daily.    [provider]  olmesartan-hydrochlorothiazide (BENICAR HCT) 40-25 MG per tablet Take 1 tablet by mouth daily.      [provider]  oxyCODONE-acetaminophen (PERCOCET) 5-325 MG per tablet Take 1 tablet by mouth every 6 (six) hours as needed. pain    [provider]  ranitidine (ZANTAC) 300 MG tablet Take 300 mg by mouth daily.    [provider]    Past Medical, Surgical Family and Social History reviewed and updated.    Objective:   Today's Vitals   01/30/17 0910  BP: (!) 150/82  Pulse: 68  Resp: 16  SpO2: 97%  Weight: 189 lb (85.7 kg)  Height: 5\' 6"  (1.676 m)    Wt Readings from Last 3 Encounters:  01/30/17 189 lb (85.7 kg)  07/25/14 188 lb (85.3 kg)  08/19/11 182 lb (82.6 kg)   Physical Exam  Constitutional: He is oriented to person,  place, and time. He appears well-developed and well-nourished.  Eyes: Conjunctivae and EOM are normal. No scleral icterus.  Sluggish response to light   Neck: Normal range of motion. Neck supple.  Cardiovascular: Normal rate, regular rhythm, normal heart sounds and intact distal pulses.   Pulmonary/Chest: Effort normal and breath sounds normal.  Abdominal: Soft. Bowel sounds are normal.  Musculoskeletal: Normal range of motion.  Neurological: He is alert and oriented to person, place, and time. He has normal reflexes.  Skin: Skin is warm and dry.  Psychiatric: He has a normal mood and affect. His behavior is normal. Judgment and thought content normal.    Assessment & Plan:  1. Essential hypertension, uncontrolled, however patient is out of medication  - COMPLETE METABOLIC PANEL WITH GFR -Continue Amlodipine 10 mg once daily.   2. Hyperlipidemia, unspecified hyperlipidemia type - Thyroid Panel With TSH - CBC with Differential -Continue Atorvastatin 20 mg at 6:00 pm  3. Glaucoma of both eyes, unspecified glaucoma stage, - Ambulatory referral to Ophthalmology  4. Urinary retention - PSA - Ambulatory referral to Urology - Continue Flomax 0.4 mg daily.  5. Screening for diabetes mellitus - POCT glycosylated hemoglobin (Hb A1C)-6.1 indicating prediabetes.  Will recheck in 6 mos. Consider treatment if increases.  6. Bilateral hearing loss, unspecified hearing loss type - Ambulatory referral to Audiology  7. Need for diphtheria-tetanus-pertussis (Tdap) vaccine - Tdap vaccine greater than or equal to 7yo IM  8. Need for pneumococcal vaccine - Pneumococcal conjugate vaccine 13-valent IM  9. Gastroesophageal reflux disease without esophagitis -Continue esomeprazole (Nexium) 300 mg daily.  RTC: 6 months or sooner if needed. You will be notified of labs and any indications for changes in treatment.

## 2017-01-30 NOTE — Patient Instructions (Signed)
Nice meeting you today! I have refilled all of your medications that we discussed.  You will be notified of any abnormal lab results. I am starting you on Gabapentin 300 mg up to 3 times daily for left facial itching. To prevent night-time urinary frequency, take Flomax 0.4 mg in the morning with breakfast.  Managing Your Hypertension Hypertension is commonly called high blood pressure. This is when the force of your blood pressing against the walls of your arteries is too strong. Arteries are blood vessels that carry blood from your heart throughout your body. Hypertension forces the heart to work harder to pump blood, and may cause the arteries to become narrow or stiff. Having untreated or uncontrolled hypertension can cause heart attack, stroke, kidney disease, and other problems. What are blood pressure readings? A blood pressure reading consists of a higher number over a lower number. Ideally, your blood pressure should be below 120/80. The first ("top") number is called the systolic pressure. It is a measure of the pressure in your arteries as your heart beats. The second ("bottom") number is called the diastolic pressure. It is a measure of the pressure in your arteries as the heart relaxes. What does my blood pressure reading mean? Blood pressure is classified into four stages. Based on your blood pressure reading, your health care provider may use the following stages to determine what type of treatment you need, if any. Systolic pressure and diastolic pressure are measured in a unit called mm Hg. Normal   Systolic pressure: below 269.  Diastolic pressure: below 80. Elevated   Systolic pressure: 485-462.  Diastolic pressure: below 80. Hypertension stage 1     Diastolic pressure: 70-35. Hypertension stage 2   Systolic pressure: 009 or above.  Diastolic pressure: 90 or above. What health risks are associated with hypertension? Managing your hypertension is an important  responsibility. Uncontrolled hypertension can lead to:  A heart attack.  A stroke.  A weakened blood vessel (aneurysm).  Heart failure.  Kidney damage.  Eye damage.  Metabolic syndrome.  Memory and concentration problems. What changes can I make to manage my hypertension? Eating and drinking   Eat a diet that is high in fiber and potassium, and low in salt (sodium), added sugar, and fat. An example eating plan is called the DASH (Dietary Approaches to Stop Hypertension) diet. To eat this way:  Eat plenty of fresh fruits and vegetables. Try to fill half of your plate at each meal with fruits and vegetables.  Eat whole grains, such as whole wheat pasta, brown rice, or whole grain bread. Fill about one quarter of your plate with whole grains.  Eat low-fat diary products.  Avoid fatty cuts of meat, processed or cured meats, and poultry with skin. Fill about one quarter of your plate with lean proteins such as fish, chicken without skin, beans, eggs, and tofu.  Avoid premade and processed foods. These tend to be higher in sodium, added sugar, and fat.     Lifestyle   Work with your health care provider to maintain a healthy body weight, or to lose weight. Ask what an ideal weight is for you.  Get at least 30 minutes of exercise that causes your heart to beat faster (aerobic exercise) most days of the week. Activities may include walking, swimming, or biking.       Monitoring   Monitor your blood pressure at home as told by your health care provider. Your personal target blood pressure may vary depending on  your medical conditions, your age, and other factors.  Have your blood pressure checked regularly, as often as told by your health care provider. Working with your health care provider   Review all the medicines you take with your health care provider because there may be side effects or interactions.  Talk with your health care provider about your diet, exercise  habits, and other lifestyle factors that may be contributing to hypertension.  Visit your health care provider regularly. Your health care provider can help you create and adjust your plan for managing hypertension. Will I need medicine to control my blood pressure? Your health care provider may prescribe medicine if lifestyle changes are not enough to get your blood pressure under control, and if:  Your systolic blood pressure is 130 or higher.  Your diastolic blood pressure is 80 or higher. Take medicines only as told by your health care provider. Follow the directions carefully. Blood pressure medicines must be taken as prescribed. The medicine does not work as well when you skip doses. Skipping doses also puts you at risk for problems. Contact a health care provider if:  You think you are having a reaction to medicines you have taken.  You have repeated (recurrent) headaches.  You feel dizzy.  You have swelling in your ankles.  You have trouble with your vision. Get help right away if:  You develop a severe headache or confusion.  You have unusual weakness or numbness, or you feel faint.  You have severe pain in your chest or abdomen.  You vomit repeatedly.  You have trouble breathing. Summary  Hypertension is when the force of blood pumping through your arteries is too strong. If this condition is not controlled, it may put you at risk for serious complications.  Your personal target blood pressure may vary depending on your medical conditions, your age, and other factors. For most people, a normal blood pressure is less than 120/80.  Hypertension is managed by lifestyle changes, medicines, or both. Lifestyle changes include weight loss, eating a healthy, low-sodium diet, exercising more, and limiting alcohol. This information is not intended to replace advice given to you by your health care provider. Make sure you discuss any questions you have with your health care  provider. Document Released: 05/27/2012 Document Revised: 07/31/2016 Document Reviewed: 07/31/2016 Elsevier Interactive Patient Education  2017 [employer].

## 2017-01-31 LAB — THYROID PANEL WITH TSH
FREE THYROXINE INDEX: 2.2 (ref 1.4–3.8)
T3 Uptake: 28 % (ref 22–35)
T4 TOTAL: 7.8 ug/dL (ref 4.5–12.0)
TSH: 1.25 mIU/L (ref 0.40–4.50)

## 2017-01-31 LAB — PSA: PSA: 9.9 ng/mL — ABNORMAL HIGH (ref <=4.0)

## 2017-02-03 ENCOUNTER — Telehealth: Payer: Self-pay

## 2017-02-04 ENCOUNTER — Other Ambulatory Visit: Payer: Self-pay | Admitting: Family Medicine

## 2017-02-04 DIAGNOSIS — H409 Unspecified glaucoma: Secondary | ICD-10-CM | POA: Insufficient documentation

## 2017-02-04 DIAGNOSIS — I10 Essential (primary) hypertension: Secondary | ICD-10-CM | POA: Insufficient documentation

## 2017-02-04 DIAGNOSIS — K219 Gastro-esophageal reflux disease without esophagitis: Secondary | ICD-10-CM | POA: Insufficient documentation

## 2017-02-04 MED ORDER — ATORVASTATIN CALCIUM 20 MG PO TABS: 20.0000 mg | ORAL_TABLET | Freq: Every day | ORAL | 1 refills | Status: DC

## 2017-02-04 MED ORDER — ESOMEPRAZOLE MAGNESIUM 40 MG PO CPDR: 40.0000 mg | DELAYED_RELEASE_CAPSULE | Freq: Every day | ORAL | 1 refills | Status: DC

## 2017-02-04 MED ORDER — GABAPENTIN 300 MG PO CAPS: 300.0000 mg | ORAL_CAPSULE | Freq: Three times a day (TID) | ORAL | 3 refills | Status: DC

## 2017-02-04 MED ORDER — TAMSULOSIN HCL 0.4 MG PO CAPS: 0.4000 mg | ORAL_CAPSULE | Freq: Every day | ORAL | 1 refills | Status: DC

## 2017-02-04 MED ORDER — AMLODIPINE BESYLATE 5 MG PO TABS: 10.0000 mg | ORAL_TABLET | Freq: Every day | ORAL | 1 refills | Status: DC

## 2017-02-04 NOTE — Telephone Encounter (Signed)
[facility] Provided patient with lab results and advised of referral to urology to be evaluated for abnormal PSA.

## 2017-02-05 MED FILL — ?TAMSULOSIN HCL 0.4 MG CAP: 0.4 | 30 days supply | Qty: 30 | Fill #0

## 2017-02-05 MED FILL — ATORVASTATIN 20 MG TABLET: 20 | 30 days supply | Qty: 30 | Fill #0

## 2017-02-05 MED FILL — ESOMEPRAZOLE MAG DR 40 MG C: 40 | 30 days supply | Qty: 30 | Fill #0

## 2017-02-05 MED FILL — GABAPENTIN 300 MG CAPSULE: 300 | 30 days supply | Qty: 90 | Fill #0

## 2017-02-05 MED FILL — AMLODIPINE BESYLATE 5 MG TA: 5 | 30 days supply | Qty: 60 | Fill #0

## 2017-02-25 ENCOUNTER — Ambulatory Visit: Admitting: Family Medicine

## 2017-02-25 VITALS — BP 138/80

## 2017-02-25 DIAGNOSIS — Z0130 Encounter for examination of blood pressure without abnormal findings: Secondary | ICD-10-CM

## 2017-03-10 MED FILL — ATORVASTATIN 20 MG TABLET: 20 | 30 days supply | Qty: 30 | Fill #1

## 2017-03-10 MED FILL — AMLODIPINE BESYLATE 5 MG TA: 5 | 30 days supply | Qty: 60 | Fill #1

## 2017-03-10 MED FILL — TAMSULOSIN HCL 0.4 MG CAP: 0.4 | 30 days supply | Qty: 30 | Fill #1

## 2017-03-10 MED FILL — ESOMEPRAZOLE MAG DR 40 MG C: 40 | 30 days supply | Qty: 30 | Fill #1

## 2017-03-10 MED FILL — GABAPENTIN 300 MG CAPSULE: 300 | 30 days supply | Qty: 90 | Fill #1

## 2017-04-10 ENCOUNTER — Other Ambulatory Visit: Payer: Self-pay | Admitting: Family Medicine

## 2017-04-10 MED FILL — AMLODIPINE BESYLATE 5 MG TA: 5 | 30 days supply | Qty: 60 | Fill #2

## 2017-04-10 MED FILL — GABAPENTIN 300 MG CAPSULE: 300 | 30 days supply | Qty: 90 | Fill #2

## 2017-04-10 MED FILL — ESOMEPRAZOLE MAG DR 40 MG C: 40 | 30 days supply | Qty: 30 | Fill #2

## 2017-04-10 MED FILL — ATORVASTATIN 20 MG TABLET: 20 | 30 days supply | Qty: 30 | Fill #2

## 2017-04-10 MED FILL — TAMSULOSIN HCL 0.4 MG CAP: 0.4 | 30 days supply | Qty: 30 | Fill #2

## 2017-05-13 ENCOUNTER — Ambulatory Visit: Payer: Medicare Other | Attending: Family Medicine | Admitting: Audiology

## 2017-05-13 DIAGNOSIS — H903 Sensorineural hearing loss, bilateral: Secondary | ICD-10-CM | POA: Diagnosis not present

## 2017-05-13 DIAGNOSIS — H9193 Unspecified hearing loss, bilateral: Secondary | ICD-10-CM | POA: Diagnosis not present

## 2017-05-13 DIAGNOSIS — H93299 Other abnormal auditory perceptions, unspecified ear: Secondary | ICD-10-CM | POA: Diagnosis not present

## 2017-05-13 DIAGNOSIS — R2689 Other abnormalities of gait and mobility: Secondary | ICD-10-CM | POA: Diagnosis not present

## 2017-05-13 NOTE — Procedures (Signed)
[facility]  [place], [place] [address]  [phone]   Audiological Evaluation  Patient Name: Jorge Martin   Status: Outpatient   DOB: 04-Mar-1935    Diagnosis: Hearing Loss            MRN: [id number] Date:  05/13/2017     Referent: Scot Jun, FNP  History: Newman Nip was seen for an audiological evaluation. Accompanied by: His daughter, Wallie Renshaw Primary Concern: Hearing loss that has gradually gotten worse over the past 5 years. Has trouble when people are talking, can't hear the doorbell. Has difficulty hearing on the telephone and can't hear the telephone ring. Pain: None History of ear infections:  N History of ear surgery or "tubes" : N History of dizziness/vertigo:   Y - have occasionally, especially "if gets hot and rides in the car". Also "sees double cars when sitting in back seat, as a passenger" this does not happen when driving". History of balance issues:  Y - for the past 3-6 months. When "steps up on something, I miss it".  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 History of hypertension: Y - controlled with medication    History of diabetes:  N Family history of hearing loss:  N Other concerns: Had a severe case of "shingles three years ago and double vision started then". Medications:    Evaluation: Conventional pure tone audiometry from 250Hz  - 8000Hz  with using insert earphones.  Hearing Thresholds of 35 dBHL from 250Hz  - 1000Hz ; 55-65 dBHL at 2000Hz ; 85-90 dBHL at 4000Hz  and 8000Hz  except for no response in the right ear at 8000Hz . The hearing loss appears sensorineural bilaterally. Reliability is good Speech reception levels (repeating words near threshold) using recorded spondee word lists:  Right ear: 40 dBHL.  Left ear:  45 dBHL Word recognition (at comfortably loud volumes) using recorded NU-6 word lists, in quiet.   Right ear: 90% at 70 dBHL.  Left ear:   86% at 75 dBHL. Word recognition in minimal background noise:  +5 dBHL  Right ear: 0%                              Left ear:  0%  Tympanometry shows normal middle ear volume, pressure and compliance (Type A) with ipsilateral acoustic reflexes that range from 95 dB to no response bilaterally from 500Hz  - 4000Hz .    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  Newman Nip has good to borderline excellent word recognition in quiet at loud levels equivalent to shouting at 3 feet. In minimal background noise he has no apparent word recognition scoring 0% in each ear with +5 dB signal to noise ratio. This amount of hearing loss would adversely affect speech communication at most conversational and social speech levels.   Word recognition is excellent in the right ear and good in the left ear in quiet. In minimal background noise, word recognition drops to 0% correct in each ear, which is of concerns. Since Chetan also experiences "double vision" when sitting as a car passenger in the back seat and has recently developed balance problems when "stepping" up further evaluation by an ENT is strongly recommended.  The test results were discussed and Newman Nip counseled.   RECOMMENDATIONS: 1.  Referral to an ENT because of reports of "seeing double", "recent development of balance issues over the past several months", hearing loss with extremely poor hearing in minimal background noise. 2.  Closely with a repeat audiological evaluation in 3 months (earlier if there is any change in hearing or ear pressure).   3.   Referral for a balance assessment with a physical therapist at [facility].  4.   A hearing aid evaluation. 5.  Strategies that help improve hearing include: A) Face the speaker directly. Optimal  is having the speakers face well - lit.  Unless amplified, being within 3-6 feet of the speaker will enhance word recognition. B) Avoid having the speaker back-lit as this will minimize the ability to use cues from lip-reading, facial expression and gestures. C)  Word recognition is poorer in background noise. For optimal word recognition, turn off the TV, radio or noisy fan when engaging in conversation. In a restaurant, try to sit away from noise sources and close to the primary speaker.  D)  Ask for topic clarification from time to time in order to remain in the conversation.  Most people don't mind repeating or clarifying a point when asked.  If needed, explain the difficulty hearing in background noise or hearing loss.  Deborah L. Heide Spark Au.D., CCC-A Doctor of Audiology 05/13/2017   cc: Scot Jun, FNP

## 2017-05-15 ENCOUNTER — Encounter: Payer: Self-pay | Admitting: Family Medicine

## 2017-05-15 ENCOUNTER — Ambulatory Visit (INDEPENDENT_AMBULATORY_CARE_PROVIDER_SITE_OTHER): Payer: Medicare Other | Admitting: Family Medicine

## 2017-05-15 VITALS — BP 144/76 | HR 68 | Temp 97.8°F | Resp 14 | Ht 66.0 in | Wt 190.8 lb

## 2017-05-15 DIAGNOSIS — R42 Dizziness and giddiness: Secondary | ICD-10-CM

## 2017-05-15 DIAGNOSIS — H40033 Anatomical narrow angle, bilateral: Secondary | ICD-10-CM | POA: Diagnosis not present

## 2017-05-15 DIAGNOSIS — J302 Other seasonal allergic rhinitis: Secondary | ICD-10-CM

## 2017-05-15 DIAGNOSIS — H1013 Acute atopic conjunctivitis, bilateral: Secondary | ICD-10-CM | POA: Diagnosis not present

## 2017-05-15 DIAGNOSIS — R630 Anorexia: Secondary | ICD-10-CM | POA: Diagnosis not present

## 2017-05-15 DIAGNOSIS — Z23 Encounter for immunization: Secondary | ICD-10-CM | POA: Diagnosis not present

## 2017-05-15 DIAGNOSIS — R0981 Nasal congestion: Secondary | ICD-10-CM | POA: Diagnosis not present

## 2017-05-15 LAB — POCT URINALYSIS DIP (DEVICE)
BILIRUBIN URINE: NEGATIVE
Glucose, UA: NEGATIVE mg/dL
KETONES UR: NEGATIVE mg/dL
Leukocytes, UA: NEGATIVE
Nitrite: NEGATIVE
PH: 6.5 (ref 5.0–8.0)
PROTEIN: 100 mg/dL — AB
SPECIFIC GRAVITY, URINE: 1.02 (ref 1.005–1.030)
Urobilinogen, UA: 0.2 mg/dL (ref 0.0–1.0)

## 2017-05-15 MED ORDER — CETIRIZINE HCL 10 MG PO TABS: 10.0000 mg | ORAL_TABLET | Freq: Every day | ORAL | 0 refills | Status: DC

## 2017-05-15 MED ORDER — IPRATROPIUM BROMIDE 0.03 % NA SOLN: 2.0000 | Freq: Two times a day (BID) | NASAL | 1 refills | Status: DC

## 2017-05-15 MED FILL — PATADAY 0.2% EYE DROPS: 0.2 | 12 days supply | Qty: 3 | Fill #0

## 2017-05-15 MED FILL — IPRATROPIUM 0.03% SPRAY: 0.03 | 30 days supply | Qty: 30 | Fill #0

## 2017-05-15 MED FILL — ?CETIRIZINE HCL 10 MG TABLE: 10 | 30 days supply | Qty: 30 | Fill #0

## 2017-05-15 NOTE — Patient Instructions (Signed)
Take your blood pressure medication, amlodipine 10 mg at bedtime to prevent dizziness.  Start Cetirizine 10 mg at bedtime and ipratropium nasal spray twice daily for nasal congestion.  Keep follow-up.    Allergies, Adult An allergy is when your body's defense system (immune system) overreacts to an otherwise harmless substance (allergen) that you breathe in or eat or something that touches your skin. When you come into contact with something that you are allergic to, your immune system produces certain proteins (antibodies). These proteins cause cells to release chemicals (histamines) that trigger the symptoms of an allergic reaction. Allergies often affect the nasal passages (allergic rhinitis), eyes (allergic conjunctivitis), skin (atopic dermatitis), and stomach. Allergies can be mild or severe. Allergies cannot spread from person to person (are not contagious). They can develop at any age and may be outgrown. What increases the risk? You may be at greater risk of allergies if other people in your family have allergies. What are the signs or symptoms? Symptoms depend on what type of allergy you have. They may include:  Runny, stuffy nose.  Sneezing.  Itchy mouth, ears, or throat.  Postnasal drip.  Sore throat.  Itchy, red, watery, or puffy eyes.  Skin rash or hives.  Stomach pain.  Vomiting.  Diarrhea.  Bloating.  Wheezing or coughing.  People with a severe allergy to food, medicine, or an insect bite may have a life-threatening allergic reaction (anaphylaxis). Symptoms of anaphylaxis include:  Hives.  Itching.  Flushed face.  Swollen lips, tongue, or mouth.  Tight or swollen throat.  Chest pain or tightness in the chest.  Trouble breathing or shortness of breath.  Rapid heartbeat.  Dizziness or fainting.  Vomiting.  Diarrhea.  Pain in the abdomen.  How is this diagnosed? This condition is diagnosed based on:  Your symptoms.  Your family and  medical history.  A physical exam.  You may need to see a health care provider who specializes in treating allergies (allergist). You may also have tests, including:  Skin tests to see which allergens are causing your symptoms, such as: ? Skin prick test. In this test, your skin is pricked with a tiny needle and exposed to small amounts of possible allergens to see if your skin reacts. ? Intradermal skin test. In this test, a small amount of allergen is injected under your skin to see if your skin reacts. ? Patch test. In this test, a small amount of allergen is placed on your skin and then your skin is covered with a bandage. Your health care provider will check your skin after a couple of days to see if a rash has developed.  Blood tests.  Challenges tests. In this test, you inhale a small amount of allergen by mouth to see if you have an allergic reaction.  You may also be asked to:  Keep a food diary. A food diary is a record of all the foods and drinks you have in a day and any symptoms you experience.  Practice an elimination diet. An elimination diet involves eliminating specific foods from your diet and then adding them back in one by one to find out if a certain food causes an allergic reaction.  How is this treated? Treatment for allergies depends on your symptoms. Treatment may include:  Cold compresses to soothe itching and swelling.  Eye drops.  Nasal sprays.  Using a saline spray or container (neti pot) to flush out the nose (nasal irrigation). These methods can help clear away  mucus and keep the nasal passages moist.  Using a humidifier.  Oral antihistamines or other medicines to block allergic reaction and inflammation.  Skin creams to treat rashes or itching.  Diet changes to eliminate food allergy triggers.  Repeated exposure to tiny amounts of allergens to build up a tolerance and prevent future allergic reactions (immunotherapy). These include: ? Allergy  shots. ? Oral treatment. This involves taking small doses of an allergen under the tongue (sublingual immunotherapy).  Emergency epinephrine injection (auto-injector) in case of an allergic emergency. This is a self-injectable, pre-measured medicine that must be given within the first few minutes of a serious allergic reaction.  Follow these instructions at home:  Avoid known allergens whenever possible.  If you suffer from airborne allergens, wash out your nose daily. You can do this with a saline spray or a neti pot to flush out your nose (nasal irrigation).  Take over-the-counter and prescription medicines only as told by your health care provider.  Keep all follow-up visits as told by your health care provider. This is important.  If you are at risk of a severe allergic reaction (anaphylaxis), keep your auto-injector with you at all times.  If you have ever had anaphylaxis, wear a medical alert bracelet or necklace that states you have a severe allergy. Contact a health care provider if:  Your symptoms do not improve with treatment. Get help right away if:  You have symptoms of anaphylaxis, such as: ? Swollen mouth, tongue, or throat. ? Pain or tightness in your chest. ? Trouble breathing or shortness of breath. ? Dizziness or fainting. ? Severe abdominal pain, vomiting, or diarrhea. This information is not intended to replace advice given to you by your health care provider. Make sure you discuss any questions you have with your health care provider. Document Released: 11/26/2002 Document Revised: 05/02/2016 Document Reviewed: 03/20/2016 Elsevier Interactive Patient Education  [employer].

## 2017-05-15 NOTE — Progress Notes (Signed)
Patient ID: Jorge Martin, male    DOB: 1935/01/27, 82 y.o.   MRN: [id number]  PCP: Jorge Jun, FNP  Chief Complaint  Patient presents with  . Nasal Congestion  . Dizziness  . Anorexia    Subjective:  HPI Jorge Martin is a 82 y.o. male presents for evaluation of persistent nasal congestion ad dizziness. Jorge Martin complains of on-going nasal congestion for log period of time that recently seems to be worsening and interfering with the quality of sleep he is able to achieve at night time. He reports a history of chronic seasonal allergies for which he is not currently taking antihistamines. He denies associated throat pain,cough, or headache. He complains of dizziness with position changes when symptoms are more severe.  Jorge Martin also complains today for decreased appetite and altered taste perception. Reports feeling full quickly after a meal. He has no associated abdominal pain, nausea, or vomiting. During his last office visit Jorge Martin was found to have an abnormally elevated PSA and he was referred urology for further evaluation although he reports no one has contacted him to schedule an appointments. He continues to experience chronic urinary retention for which he takes Flomax daily. Social History   Social History  . Marital status: Married    Spouse name: N/A  . Number of children: N/A  . Years of education: N/A   Occupational History  . Not on file.   Social History Main Topics  . Smoking status: Never Smoker  . Smokeless tobacco: Never Used  . Alcohol use No  . Drug use: No  . Sexual activity: Not on file   Other Topics Concern  . Not on file   Social History Narrative  . No narrative on file    Family History  Problem Relation Age of Onset  . Hyperlipidemia Mother   . Hyperlipidemia Father    Review of Systems See HPI Patient Active Problem List   Diagnosis Date Noted  . HTN (hypertension) 02/04/2017  . Acid reflux 02/04/2017  . Glaucoma 02/04/2017     No Known Allergies  Prior to Admission medications   Medication Sig Start Date End Date Taking? Authorizing Provider  allopurinol (ZYLOPRIM) 100 MG tablet Take 100 mg by mouth 2 (two) times daily.   Yes [provider]  amLODipine (NORVASC) 5 MG tablet TAKE 2 TABLETS BY MOUTH DAILY. 04/10/17  Yes Jorge Jun, FNP  atorvastatin (LIPITOR) 20 MG tablet Take 1 tablet (20 mg total) by mouth daily. 02/04/17  Yes Jorge Jun, FNP  cyproheptadine (PERIACTIN) 4 MG tablet Take 4 mg by mouth at bedtime.   Yes [provider]  esomeprazole (NEXIUM) 40 MG capsule Take 1 capsule (40 mg total) by mouth daily before breakfast. 02/04/17  Yes Jorge Jun, FNP  gabapentin (NEURONTIN) 300 MG capsule Take 1 capsule (300 mg total) by mouth 3 (three) times daily. 02/04/17  Yes Jorge Jun, FNP  tamsulosin (FLOMAX) 0.4 MG CAPS capsule Take 1 capsule (0.4 mg total) by mouth daily after breakfast. 02/04/17  Yes Jorge Jun, FNP  oxyCODONE-acetaminophen (PERCOCET) 5-325 MG per tablet Take 1 tablet by mouth every 6 (six) hours as needed. pain    [provider]  ranitidine (ZANTAC) 300 MG tablet Take 300 mg by mouth daily.    [provider]    Past Medical, Surgical Family and Social History reviewed and updated.    Objective:   Today's Vitals   05/15/17 0933  BP: [name])  144/76  Pulse: 68  Resp: 14  Temp: 97.8 F (36.6 C)  TempSrc: Oral  SpO2: 99%  Weight: 190 lb 12.8 oz (86.5 kg)  Height: 5\' 6"  (1.676 m)    Wt Readings from Last 3 Encounters:  05/15/17 190 lb 12.8 oz (86.5 kg)  01/30/17 189 lb (85.7 kg)  07/25/14 188 lb (85.3 kg)   Physical Exam  Constitutional: He is oriented to person, place, and time. He appears well-developed and well-nourished.  HENT:  Head: Normocephalic and atraumatic.  Eyes: Pupils are equal, round, and reactive to light. Conjunctivae and EOM are normal.  Cardiovascular: Normal rate, regular rhythm, normal  heart sounds and intact distal pulses.   Pulmonary/Chest: Effort normal and breath sounds normal.  Musculoskeletal: Normal range of motion.  Neurological: He is alert and oriented to person, place, and time.  Skin: Skin is warm and dry.  Psychiatric: He has a normal mood and affect. His behavior is normal. Judgment and thought content normal.   Assessment & Plan:  1. Seasonal allergic rhinitis, unspecified trigger 2. Nasal congestion Meds ordered this encounter  Medications  . cetirizine (ZYRTEC) 10 MG tablet    Sig: Take 1 tablet (10 mg total) by mouth at bedtime.    Dispense:  90 tablet    Refill:  0    Order Specific Question:   Supervising Provider    Answer:   Tresa Garter [id number]  . ipratropium (ATROVENT) 0.03 % nasal spray    Sig: Place 2 sprays into both nostrils 2 (two) times daily.    Dispense:  30 mL    Refill:  1    Order Specific Question:   Supervising Provider    Answer:   Angelica Chessman E [id number]   3. Dizziness, questionable if dizziness is related to allergies and or side effects of amlodipine. -recommended taking amlodipine at bedtime oppose to twice daily to see if symptoms resolve -proceed with recommended antihistamine therapy to treat allergy symptom   4. Need for influenza vaccination - Flu Vaccine QUAD 36+ mos IM  5. Decreased appetite -no recent weight loss. Current Body mass index is 30.8 kg/m. -will continue to monitor weight and for the development of any new GI symptoms   RTC: 3 months chronic disease management    Jorge Martin. Jorge Kingfisher, MSN, FNP-C [facility]  [address] [place], [place] [address] [phone]

## 2017-05-22 ENCOUNTER — Telehealth: Payer: Self-pay | Admitting: Family Medicine

## 2017-05-22 NOTE — Telephone Encounter (Signed)
Jorge Martin, please follow-up on referral  made for patient to  Urology back in May related to abnormal PSA level. He has never been contacted to schedule an appointment.

## 2017-05-23 MED FILL — AMLODIPINE BESYLATE 5 MG TA: 5 | 45 days supply | Qty: 90 | Fill #0

## 2017-05-23 NOTE — Telephone Encounter (Signed)
Referral faxed to [facility]  

## 2017-06-03 DIAGNOSIS — H2513 Age-related nuclear cataract, bilateral: Secondary | ICD-10-CM | POA: Diagnosis not present

## 2017-06-04 MED FILL — ESOMEPRAZOLE MAG DR 40 MG C: 40 | 30 days supply | Qty: 30 | Fill #3

## 2017-06-04 MED FILL — ?ATORVASTATIN 20 MG TABLET: 20 | 30 days supply | Qty: 30 | Fill #3

## 2017-06-04 MED FILL — ?TAMSULOSIN HCL 0.4 MG CAP: 0.4 | 30 days supply | Qty: 30 | Fill #3

## 2017-06-05 ENCOUNTER — Telehealth: Payer: Self-pay

## 2017-06-05 NOTE — Telephone Encounter (Signed)
I have not reached out to patient. According to documentation on 05/22/2017, patient was referred to [facility] and that is likely who attempted to reach out to patient. Please provide him with contact information for [facility].  Carroll Sage. Kenton Kingfisher, MSN, FNP-C [facility]  [address] [place], [place] [address] [phone]

## 2017-06-19 MED FILL — GABAPENTIN 300 MG CAPSULE: 300 | 30 days supply | Qty: 90 | Fill #3

## 2017-06-20 ENCOUNTER — Ambulatory Visit (INDEPENDENT_AMBULATORY_CARE_PROVIDER_SITE_OTHER): Payer: Self-pay | Admitting: Family Medicine

## 2017-06-20 ENCOUNTER — Encounter: Payer: Self-pay | Admitting: Family Medicine

## 2017-06-20 VITALS — BP 138/77 | HR 65 | Temp 97.5°F | Resp 12 | Ht 66.0 in | Wt 187.2 lb

## 2017-06-20 DIAGNOSIS — R519 Headache, unspecified: Secondary | ICD-10-CM

## 2017-06-20 DIAGNOSIS — R51 Headache: Secondary | ICD-10-CM

## 2017-06-20 DIAGNOSIS — R0981 Nasal congestion: Secondary | ICD-10-CM

## 2017-06-20 MED ORDER — LEVOCETIRIZINE DIHYDROCHLORIDE 5 MG PO TABS: 5.0000 mg | ORAL_TABLET | Freq: Every evening | ORAL | 1 refills | Status: DC

## 2017-06-20 MED ORDER — PREDNISONE 20 MG PO TABS: ORAL_TABLET | ORAL | 0 refills | Status: DC

## 2017-06-20 NOTE — Patient Instructions (Addendum)
Sinus Congestion: Start Levocetirizine 5 mg at bedtime nightly for nasal congestion.  Take Prednisone 20 mg,  in mornings with breakfast as follows:  Take 3 pills for 3 days, Take 2 pills for 3 days, and Take 1 pill for 3 days.  Complete all medication.  Headaches: For headache, take acetaminophen 650 mg every 6 hours as needed for headache pain.  You will be contact regarding scheduling of your  Head CT and neurology appointment

## 2017-06-20 NOTE — Progress Notes (Signed)
Patient ID: [name], male    DOB: [birth year], 82 y.o.   MRN: [id number]  PCP: Scot Jun, FNP  Chief Complaint  Patient presents with  . Headache  . Insomnia    Subjective:  HPI Jorge Martin is a 82 y.o. male presents for evaluation of headaches and chronic nasal congestion. Jorge Martin reports that every morning that he awakens his nose is congested and stuffy. He is unable to produce any mucus with blowing his nose. Reports that he was told in the past to try humidifier however he's never purchased. Jorge Martin notes that as the day progresses his nose is no longer stuffy. He has trialed several allergies nasal sprays and was placed on cetirizine and Atrovent which he reports has not helped the symptoms. He reports using a nasal spray that he was given while incarcerated and this is been helping with congestion, however he does not know the name of the medication. Jorge Martin reports over the last month experiencing daily onset of headache. Headaches pain is most prominent across the front of his head. He often awakens with a headache and that sometimes worsen as the day progresses. He denies any associated visual disturbances, imbalance of gait, weakness, nausea, or vomiting. He has no history of CVA or prior history of chronic headaches. Social History   Social History  . Marital status: Married    Spouse name: N/A  . Number of children: N/A  . Years of education: N/A   Occupational History  . Not on file.   Social History Main Topics  . Smoking status: Never Smoker  . Smokeless tobacco: Never Used  . Alcohol use No  . Drug use: No  . Sexual activity: Not on file   Other Topics Concern  . Not on file   Social History Narrative  . No narrative on file    Family History  Problem Relation Age of Onset  . Hyperlipidemia Mother   . Hyperlipidemia Father    Review of Systems See HPI  Patient Active Problem List   Diagnosis Date Noted  . HTN (hypertension) 02/04/2017   . Acid reflux 02/04/2017  . Glaucoma 02/04/2017    No Known Allergies  Prior to Admission medications   Medication Sig Start Date End Date Taking? Authorizing Provider  allopurinol (ZYLOPRIM) 100 MG tablet Take 100 mg by mouth 2 (two) times daily.    [provider]  amLODipine (NORVASC) 5 MG tablet TAKE 2 TABLETS BY MOUTH DAILY. 04/10/17   Scot Jun, FNP  atorvastatin (LIPITOR) 20 MG tablet Take 1 tablet (20 mg total) by mouth daily. 02/04/17   Scot Jun, FNP  cetirizine (ZYRTEC) 10 MG tablet Take 1 tablet (10 mg total) by mouth at bedtime. 05/15/17   Scot Jun, FNP  cyproheptadine (PERIACTIN) 4 MG tablet Take 4 mg by mouth at bedtime.    [provider]  esomeprazole (NEXIUM) 40 MG capsule Take 1 capsule (40 mg total) by mouth daily before breakfast. 02/04/17   Scot Jun, FNP  gabapentin (NEURONTIN) 300 MG capsule Take 1 capsule (300 mg total) by mouth 3 (three) times daily. 02/04/17   Scot Jun, FNP  ipratropium (ATROVENT) 0.03 % nasal spray Place 2 sprays into both nostrils 2 (two) times daily. 05/15/17   Scot Jun, FNP  oxyCODONE-acetaminophen (PERCOCET) 5-325 MG per tablet Take 1 tablet by mouth every 6 (six) hours as needed. pain    [provider]  ranitidine (ZANTAC) 300  MG tablet Take 300 mg by mouth daily.    [provider]  tamsulosin (FLOMAX) 0.4 MG CAPS capsule Take 1 capsule (0.4 mg total) by mouth daily after breakfast. 02/04/17   Scot Jun, FNP    Past Medical, Surgical Family and Social History reviewed and updated.    Objective:   Today's Vitals   06/20/17 0953  BP: 138/77  Pulse: 65  Resp: 12  Temp: (!) 97.5 F (36.4 C)  TempSrc: Oral  SpO2: 96%  Weight: 187 lb 3.2 oz (84.9 kg)  Height: 5\' 6"  (1.676 m)    Wt Readings from Last 3 Encounters:  05/15/17 190 lb 12.8 oz (86.5 kg)  01/30/17 189 lb (85.7 kg)  07/25/14 188 lb (85.3 kg)   Physical Exam   Constitutional: He is oriented to person, place, and time. He appears well-developed and well-nourished.  HENT:  Head: Normocephalic and atraumatic.  Right Ear: External ear normal.  Left Ear: External ear normal.  Eyes: Pupils are equal, round, and reactive to light. Conjunctivae are normal.  Cardiovascular: Normal rate, regular rhythm, normal heart sounds and intact distal pulses.   Pulmonary/Chest: Effort normal and breath sounds normal.  Neurological: He is alert and oriented to person, place, and time.  Skin: Skin is warm and dry.  Psychiatric: He has a normal mood and affect. His behavior is normal. Judgment and thought content normal.    Assessment & Plan:  1. Nonintractable headache, unspecified chronicity pattern, unspecified headache type, Possibly related to chronic nasal congestion and or from a neurological etiology. Given patient's age and new onset headaches in spite of well controlled hypertension, will consult with neurology for second opinion and obtain a CT of the head. - CT Head Wo Contrast; Future - Ambulatory referral to Neurology  2. Chronic nasal congestion -Discontinue cetirizine, start Levocetirizine 5 mg at bedtime once daily. -Patient to make me aware of the name of the nasal spray he has been using. Counseled regarding persistent use of nasal spray as it can have rebound effect and cause increased nasal congestion.  RTC: 6 weeks for follow-up of headaches.  Carroll Sage. Kenton Kingfisher, MSN, FNP-C [facility]  [address] [place], [place] [address] [phone]

## 2017-07-01 ENCOUNTER — Ambulatory Visit (HOSPITAL_COMMUNITY)

## 2017-07-17 MED FILL — AMLODIPINE BESYLATE 5 MG TA: 5 | 45 days supply | Qty: 90 | Fill #1

## 2017-07-17 MED FILL — ?ESOMEPRAZOLE MAG DR 40MG C: 40 | 30 days supply | Qty: 30 | Fill #4

## 2017-07-17 MED FILL — ?ATORVASTATIN 20 MG TABLET: 20 | 30 days supply | Qty: 30 | Fill #4

## 2017-07-17 MED FILL — ?TAMSULOSIN HCL 0.4 MG CAP: 0.4 | 30 days supply | Qty: 30 | Fill #4

## 2017-08-01 ENCOUNTER — Ambulatory Visit: Admitting: Family Medicine

## 2017-08-04 ENCOUNTER — Ambulatory Visit: Admitting: Family Medicine

## 2017-08-11 ENCOUNTER — Encounter: Payer: Self-pay | Admitting: Family Medicine

## 2017-08-11 ENCOUNTER — Ambulatory Visit (INDEPENDENT_AMBULATORY_CARE_PROVIDER_SITE_OTHER): Payer: Medicare Other | Admitting: Family Medicine

## 2017-08-11 VITALS — BP 140/82 | HR 94 | Temp 98.9°F | Resp 16 | Ht 66.0 in | Wt 190.4 lb

## 2017-08-11 DIAGNOSIS — R7303 Prediabetes: Secondary | ICD-10-CM | POA: Diagnosis not present

## 2017-08-11 DIAGNOSIS — H409 Unspecified glaucoma: Secondary | ICD-10-CM | POA: Diagnosis not present

## 2017-08-11 DIAGNOSIS — R972 Elevated prostate specific antigen [PSA]: Secondary | ICD-10-CM

## 2017-08-11 DIAGNOSIS — R3912 Poor urinary stream: Secondary | ICD-10-CM

## 2017-08-11 DIAGNOSIS — R7989 Other specified abnormal findings of blood chemistry: Secondary | ICD-10-CM

## 2017-08-11 DIAGNOSIS — N529 Male erectile dysfunction, unspecified: Secondary | ICD-10-CM

## 2017-08-11 DIAGNOSIS — R51 Headache: Secondary | ICD-10-CM

## 2017-08-11 DIAGNOSIS — R519 Headache, unspecified: Secondary | ICD-10-CM

## 2017-08-11 DIAGNOSIS — R0981 Nasal congestion: Secondary | ICD-10-CM | POA: Diagnosis not present

## 2017-08-11 DIAGNOSIS — R350 Frequency of micturition: Secondary | ICD-10-CM

## 2017-08-11 LAB — COMPLETE METABOLIC PANEL WITH GFR
AG Ratio: 1.7 (calc) (ref 1.0–2.5)
ALBUMIN MSPROF: 4.4 g/dL (ref 3.6–5.1)
ALT: 12 U/L (ref 9–46)
AST: 13 U/L (ref 10–35)
Alkaline phosphatase (APISO): 85 U/L (ref 40–115)
BILIRUBIN TOTAL: 0.6 mg/dL (ref 0.2–1.2)
BUN / CREAT RATIO: 17 (calc) (ref 6–22)
BUN: 22 mg/dL (ref 7–25)
CHLORIDE: 106 mmol/L (ref 98–110)
CO2: 27 mmol/L (ref 20–32)
Calcium: 8.9 mg/dL (ref 8.6–10.3)
Creat: 1.27 mg/dL — ABNORMAL HIGH (ref 0.70–1.11)
GFR, EST AFRICAN AMERICAN: 61 mL/min/{1.73_m2} (ref >=60)
GFR, Est Non African American: 53 mL/min/{1.73_m2} — ABNORMAL LOW (ref >=60)
GLOBULIN: 2.6 g/dL (ref 1.9–3.7)
GLUCOSE: 123 mg/dL — AB (ref 65–99)
Potassium: 3.8 mmol/L (ref 3.5–5.3)
SODIUM: 140 mmol/L (ref 135–146)
TOTAL PROTEIN: 7 g/dL (ref 6.1–8.1)

## 2017-08-11 LAB — POCT GLYCOSYLATED HEMOGLOBIN (HGB A1C): HEMOGLOBIN A1C: 6

## 2017-08-11 MED ORDER — TRIAMCINOLONE ACETONIDE 55 MCG/ACT NA AERO: 2.0000 | INHALATION_SPRAY | Freq: Every day | NASAL | 12 refills | Status: DC

## 2017-08-11 NOTE — Progress Notes (Signed)
Patient ID: Jorge Martin, male    DOB: 10-07-1934, 82 y.o.   MRN: [id number]  PCP: Scot Jun, FNP  Chief Complaint  Patient presents with  . Follow-up    6 MONTH    Subjective:  HPI Jorge Martin is a 82 y.o. male presents for 6 month follow-up of chronic conditions. Medical history significant for hypertension, glaucoma,elevated PSA, prediabetes, and GERD. Jorge Martin has an ongoing complaint of persistent daily headaches. He has been seen for this complaint previously and a CT of Head was ordered, however patient has never followed through with CT scan. He suffers from chronic nasal congestion secondary to chronic allergies which has been treated with oral antihistamines and nasal sprays. Headaches have persisted in spite of allergy treatments. Reports occur upon awakening and occasionally are associated with dizziness. He is currently taking gabapentin for neuropathy, although reports medication is not improving his headaches. He has no known history of CVA. Blood pressure has remained stable as of recent. Jorge Martin has a known history of glaucoma and has not been evaluated by ophthalmology since he was released from prison earlier this year. He was previously followed by Dr. Lovie Macadamia at [facility] and is not receiving any treatment for eye condition. Jorge Martin was found to have an elevated PSA level with associated urinary stream weakness, retention, and frequency. He was referred to urology and reports that he never followed up due to insurance problems. He was recently prescribed at [facility] which he reports has improved symptoms of weak stream, although continues to experience urinary frequency. Last hemoglobin A1C 6.0 indicative of prediabetes. Jorge Martin is also requesting medication for erectile dysfunction.  He reports difficulty achieving and maintaining an erection. Duration of this problem is unknown. He reports no prior therapy or treatment for ED. Social History   Socioeconomic History   . Marital status: Widowed    Spouse name: Not on file  . Number of children: Not on file  . Years of education: Not on file  . Highest education level: Not on file  Social Needs  . Financial resource strain: Not on file  . Food insecurity - worry: Not on file  . Food insecurity - inability: Not on file  . Transportation needs - medical: Not on file  . Transportation needs - non-medical: Not on file  Occupational History  . Not on file  Tobacco Use  . Smoking status: Never Smoker  . Smokeless tobacco: Never Used  Substance and Sexual Activity  . Alcohol use: No  . Drug use: No  . Sexual activity: Not on file  Other Topics Concern  . Not on file  Social History Narrative  . Not on file    Family History  Problem Relation Age of Onset  . Hyperlipidemia Mother   . Hyperlipidemia Father     Review of Systems  Constitutional: Negative.   HENT: Positive for congestion.   Eyes: Positive for redness and visual disturbance.  Respiratory: Negative.   Cardiovascular: Negative.   Genitourinary: Positive for urgency.  Neurological: Positive for dizziness and headaches.  Hematological: Negative.   Psychiatric/Behavioral: Negative.         Patient Active Problem List   Diagnosis Date Noted  . HTN (hypertension) 02/04/2017  . Acid reflux 02/04/2017  . Glaucoma 02/04/2017    No Known Allergies  Prior to Admission medications   Medication Sig Start Date End Date Taking? Authorizing Provider  allopurinol (ZYLOPRIM) 100 MG tablet Take 100 mg by mouth 2 (two) times  daily.   Yes [provider]  amLODipine (NORVASC) 5 MG tablet TAKE 2 TABLETS BY MOUTH DAILY. 04/10/17  Yes Scot Jun, FNP  atorvastatin (LIPITOR) 20 MG tablet Take 1 tablet (20 mg total) by mouth daily. 02/04/17  Yes Scot Jun, FNP  cyproheptadine (PERIACTIN) 4 MG tablet Take 4 mg by mouth at bedtime.   Yes [provider]  esomeprazole (NEXIUM) 40 MG capsule Take 1 capsule (40  mg total) by mouth daily before breakfast. 02/04/17  Yes Scot Jun, FNP  gabapentin (NEURONTIN) 300 MG capsule Take 1 capsule (300 mg total) by mouth 3 (three) times daily. 02/04/17  Yes Scot Jun, FNP  levocetirizine (XYZAL) 5 MG tablet Take 1 tablet (5 mg total) by mouth every evening. 06/20/17  Yes Scot Jun, FNP  oxyCODONE-acetaminophen (PERCOCET) 5-325 MG per tablet Take 1 tablet by mouth every 6 (six) hours as needed. pain   Yes [provider]  predniSONE (DELTASONE) 20 MG tablet Take 3 PO QAM x3days, 2 PO QAM x3days, 1 PO QAM x3days 06/20/17  Yes Scot Jun, FNP  ranitidine (ZANTAC) 300 MG tablet Take 300 mg by mouth daily.   Yes [provider]  tamsulosin (FLOMAX) 0.4 MG CAPS capsule Take 1 capsule (0.4 mg total) by mouth daily after breakfast. 02/04/17  Yes Scot Jun, FNP    Past Medical, Surgical Family and Social History reviewed and updated.    Objective:   Today's Vitals   08/11/17 1003  BP: 140/82  Pulse: 94  Resp: 16  Temp: 98.9 F (37.2 C)  TempSrc: Oral  SpO2: 98%  Weight: 190 lb 6.4 oz (86.4 kg)  Height: 5\' 6"  (1.676 m)    Wt Readings from Last 3 Encounters:  08/11/17 190 lb 6.4 oz (86.4 kg)  06/20/17 187 lb 3.2 oz (84.9 kg)  05/15/17 190 lb 12.8 oz (86.5 kg)    Physical Exam Constitutional: Patient appears well-developed and well-nourished. No distress. HENT: Normocephalic, atraumatic, External right and left ear normal. Oropharynx is clear and moist.  Eyes: Conjunctivae and EOM are normal. PERRLA, no scleral icterus. Neck: Normal ROM. Neck supple. No JVD. No tracheal deviation. No thyromegaly. CVS: RRR, S1/S2 +, no murmurs, no gallops, no carotid bruit.  Pulmonary: Effort and breath sounds normal, no stridor, rhonchi, wheezes, rales.  Abdominal: Soft. BS +, no distension, tenderness, rebound or guarding.  Musculoskeletal: Normal range of motion. No edema and no tenderness.  Lymphadenopathy: No  lymphadenopathy noted, cervical, inguinal or axillary Neuro: Alert. Normal muscle tone coordination. No cranial nerve deficit. Skin: Skin is warm and dry. No rash noted. Not diaphoretic. No erythema. No pallor. Psychiatric: Normal mood and affect. Behavior, judgment, thought content normal.   Assessment & Plan:  1. Nonintractable headache, unspecified chronicity pattern, unspecified headache type, headaches have not resolved with aggressive treatment of chronic nasal congestion and seasonal allergies.  Also takes chronic gabapentin along with Excedrin continued daily headaches.  CT of the head ordered however patient never obtained imaging.  Referred to neurology and reports that he recently had to reschedule the appointment due to insurance issues.  I would defer any further treatment as patient has a follow-up scheduled in early December with neurology.  He was advised that if headaches became more severe, or is associated with  unilateral or bilateral weakness, or slurring of speech, he should go immediately to the emergency department for further evaluation.   2. Weak urinary stream, for now will continue Flomax 0.4  as this has improved urine stream velocity.  Defer to urology for any further workup or treatment.  3. Elevated PSA, and a new referral to urology as patient never followed up previously.  Last PSA level 9.9.   4. Urinary frequency, suspicious for BPH related symptoms.  Patient's current A1c today is 6.0 which is consistent with prediabetes.  5. Erectile dysfunction, unspecified erectile dysfunction type-due to multiple chronic medical condition best patient I would not prescribe erectile dysfunction medications.  I will defer decision to urology.  Patient would be high risk for use of erectile dysfunction medications as the first from prediabetes, hypertension, glaucoma, and chronic headaches.  6. Creatinine elevation, last creatinine 1.26/GFR 62. Will repeat today.   7.  Prediabetes, stable A1C remains 6.0. Lifestyle improvement recommended such as increasing physical activity and reducing intake of foods high in simple carbohydrates.    8. Glaucoma of left eye, unspecified glaucoma type, referring to [facility] for evaluation and treatment if indicated.   9. Chronic nasal congestion, patient reports improvement with Nasacort. Will refill today and encouraged consistent daily use of Levocetirizine.     Meds ordered this encounter  Medications  . triamcinolone (NASACORT) 55 MCG/ACT AERO nasal inhaler    Sig: Place 2 sprays into the nose daily.    Dispense:  1 Inhaler    Refill:  12    Patient will pick-up when needed    Order Specific Question:   Supervising Provider    Answer:   Tresa Garter [[id number]]   Orders Placed This Encounter  Procedures  . COMPLETE METABOLIC PANEL WITH GFR  . Ambulatory referral to Urology  . Ambulatory referral to Ophthalmology  . POCT glycosylated hemoglobin (Hb A1C)    RTC: 3 months for chronic condition follow-up.    Carroll Sage. Kenton Kingfisher, MSN, FNP-C [facility]  [address] [place], [place] [address] [phone]

## 2017-08-12 ENCOUNTER — Telehealth: Payer: Self-pay

## 2017-08-13 ENCOUNTER — Other Ambulatory Visit: Payer: Self-pay

## 2017-08-13 NOTE — Telephone Encounter (Signed)
Patient states he didn't call yesterday and doesn't need anything at this time.

## 2017-08-14 ENCOUNTER — Telehealth: Payer: Self-pay | Admitting: Family Medicine

## 2017-08-14 NOTE — Telephone Encounter (Signed)
Referrals processed.

## 2017-08-14 NOTE — Telephone Encounter (Signed)
OV note complete. Please process referral to [facility] and [facility].  Thanks,  Carroll Sage. Kenton Kingfisher, MSN, FNP-C [facility]  [address] [place], [place] [address] [phone]

## 2017-08-18 ENCOUNTER — Other Ambulatory Visit: Payer: Self-pay | Admitting: Family Medicine

## 2017-08-18 MED ORDER — AMLODIPINE BESYLATE 5 MG PO TABS: 10.0000 mg | ORAL_TABLET | Freq: Every day | ORAL | 1 refills | Status: DC

## 2017-08-18 MED ORDER — GABAPENTIN 300 MG PO CAPS: 300.0000 mg | ORAL_CAPSULE | Freq: Three times a day (TID) | ORAL | 3 refills | Status: DC

## 2017-08-18 MED FILL — ?CETIRIZINE HCL 10 MG TABLE: 10 | 30 days supply | Qty: 30 | Fill #1

## 2017-08-18 MED FILL — ?ESOMEPRAZOLE MAG DR 40MG C: 40 | 30 days supply | Qty: 30 | Fill #5

## 2017-08-18 MED FILL — ?ATORVASTATIN 20 MG TABLET: 20 | 30 days supply | Qty: 30 | Fill #5

## 2017-08-18 MED FILL — GABAPENTIN 300 MG CAPSULE: 300 | 30 days supply | Qty: 90 | Fill #0

## 2017-08-18 MED FILL — PATADAY 0.2% EYE DROPS: 0.2 | 12 days supply | Qty: 3 | Fill #1

## 2017-08-18 MED FILL — AMLODIPINE BESYLATE 5 MG TA: 5 | 30 days supply | Qty: 60 | Fill #0

## 2017-08-18 MED FILL — ?TAMSULOSIN HCL 0.4 MG CAP: 0.4 | 30 days supply | Qty: 30 | Fill #5

## 2017-08-18 NOTE — Telephone Encounter (Signed)
Patient's daughter asked that the [facility] be removed from his chart and that all his meds be sent to [facility] going forward. Please advise.

## 2017-08-18 NOTE — Addendum Note (Signed)
Addended by: Scot Jun on: 08/18/2017 05:28 PM   Modules accepted: Orders

## 2017-08-18 NOTE — Telephone Encounter (Addendum)
Refilled medication Norvasc and Gabapentin to [facility].  Carroll Sage. Kenton Kingfisher, MSN, FNP-C [facility]  [address] [place], [place] [address] [phone]

## 2017-09-02 ENCOUNTER — Ambulatory Visit (INDEPENDENT_AMBULATORY_CARE_PROVIDER_SITE_OTHER): Payer: Medicare Other | Admitting: Neurology

## 2017-09-02 ENCOUNTER — Encounter: Payer: Self-pay | Admitting: Neurology

## 2017-09-02 VITALS — BP 127/76 | HR 73 | Ht 66.0 in | Wt 190.0 lb

## 2017-09-02 DIAGNOSIS — J329 Chronic sinusitis, unspecified: Secondary | ICD-10-CM | POA: Diagnosis not present

## 2017-09-02 DIAGNOSIS — R0683 Snoring: Secondary | ICD-10-CM | POA: Diagnosis not present

## 2017-09-02 DIAGNOSIS — H9192 Unspecified hearing loss, left ear: Secondary | ICD-10-CM

## 2017-09-02 DIAGNOSIS — H4050X1 Glaucoma secondary to other eye disorders, unspecified eye, mild stage: Secondary | ICD-10-CM | POA: Diagnosis not present

## 2017-09-02 DIAGNOSIS — R51 Headache: Secondary | ICD-10-CM | POA: Diagnosis not present

## 2017-09-02 DIAGNOSIS — R519 Headache, unspecified: Secondary | ICD-10-CM

## 2017-09-02 DIAGNOSIS — H532 Diplopia: Secondary | ICD-10-CM | POA: Diagnosis not present

## 2017-09-02 NOTE — Progress Notes (Addendum)
[facility]    Provider:  Dr Jaynee Eagles Referring Provider: Scot Jun, FNP Primary Care Physician:  Scot Jun, FNP  CC:  Headaches  Addendum: saw dr Ernesto Rutherford 12/19 dx maxillary, ethmoid, and frontal sinusitis and is being treated  HPI:  Jorge Martin is a 82 y.o. male here as a referral from Dr. Kenton Kingfisher for headache. PMHx  HTN, glaucoma, prediabetes and GERD. Denies any PMHx of migraines. Headaches started right after getting out of prison. Here with daughter who also provides information. Started immediately after prison. There was dust in the house but was cleaned up, he was in jail for 5 years. He has headaches on the left side where he had the shingles a few years ago, around the left eye, however he also says it may have started before prison in the setting of shingles. He went to [facility] while in prison due to this and didn;t affect his eye.  Headache is nagging pain. No pulsating, no light or sound sensitivity, he has double vision, vision changes. He snores, when he wakes up he has headache, not too tired. Remore hx of significant alcohol use.   Reviewed notes, labs and imaging from outside physicians, which showed:  Patient has persistent daily headaches, reviewed primary care notes.  He has been seen multiple times and a CT of the head was ordered but he did not follow through.  He also suffers from chronic nasal congestion secondary to chronic allergies and treated with oral antihistamine nasal spray.  Headaches have persisted in spite of allergy treatments.  Headaches occur upon awakening and occasionally associated with dizziness.  He takes gabapentin for neuropathy.  Gabapentin not helping his headaches.  No known history of CVA, blood pressure stable, he has a known history of glaucoma and has not been evaluated by ophthalmology since he was released from prison earlier this year.  Previously followed by [facility].  Review of Systems: Patient complains of  symptoms per HPI as well as the following symptoms: blurred vision, double vision, memory loss, confusion, headache, snoring, dizziness, tremor. Pertinent negatives and positives per HPI. All others negative.   Social History   Socioeconomic History  . Marital status: Widowed    Spouse name: Not on file  . Number of children: 8  . Years of education: 6th grade  . Highest education level: Not on file  Social Needs  . Financial resource strain: Not on file  . Food insecurity - worry: Not on file  . Food insecurity - inability: Not on file  . Transportation needs - medical: Not on file  . Transportation needs - non-medical: Not on file  Occupational History  . Not on file  Tobacco Use  . Smoking status: Never Smoker  . Smokeless tobacco: Never Used  Substance and Sexual Activity  . Alcohol use: No  . Drug use: No  . Sexual activity: Not on file  Other Topics Concern  . Not on file  Social History Narrative   He lives at home alone   Drinks 2 cups of coffee daily    Right handed    Family History  Problem Relation Age of Onset  . Hyperlipidemia Mother   . Hyperlipidemia Father     Past Medical History:  Diagnosis Date  . Gout   . Headache   . Hypercholesteremia   . Hypertension     History reviewed. No pertinent surgical history.  Current Outpatient Medications  Medication Sig Dispense Refill  . amLODipine (NORVASC)  5 MG tablet Take 2 tablets (10 mg total) by mouth daily. 90 tablet 1  . atorvastatin (LIPITOR) 20 MG tablet Take 1 tablet (20 mg total) by mouth daily. 90 tablet 1  . cetirizine (ZYRTEC) 10 MG tablet Take 10 mg by mouth at bedtime.    [name] esomeprazole (NEXIUM) 40 MG capsule Take 1 capsule (40 mg total) by mouth daily before breakfast. 90 capsule 1  . gabapentin (NEURONTIN) 300 MG capsule Take 1 capsule (300 mg total) by mouth 3 (three) times daily. 90 capsule 3  . tamsulosin (FLOMAX) 0.4 MG CAPS capsule Take 1 capsule (0.4 mg total) by mouth daily after  breakfast. 90 capsule 1  . triamcinolone (NASACORT) 55 MCG/ACT AERO nasal inhaler Place 2 sprays into the nose daily. 1 Inhaler 12   No current facility-administered medications for this visit.     Allergies as of 09/02/2017  . (No Known Allergies)    Vitals: BP 127/76 (BP Location: Right Arm, Patient Position: Sitting)   Pulse 73   Ht '5\' 6"'  (1.676 m)   Wt 190 lb (86.2 kg)   BMI 30.67 kg/m  Last Weight:  Wt Readings from Last 1 Encounters:  09/02/17 190 lb (86.2 kg)   Last Height:   Ht Readings from Last 1 Encounters:  09/02/17 '5\' 6"'  (1.676 m)    Physical exam: Exam: Gen: NAD, conversant, well nourised, obese, well groomed                     CV: RRR, no MRG. No Carotid Bruits. No peripheral edema, warm, nontender Eyes: Conjunctivae clear without exudates or hemorrhage  Neuro: Detailed Neurologic Exam  Speech:    Speech is normal; fluent and spontaneous with normal comprehension.  Cognition:    The patient is oriented to person, place, and time;     recent and remote memory impaired;     language fluent;     Impaired attention, concentration,     fund of knowledge impaired Cranial Nerves:    The pupils are equal, round, and reactive to light. The fundi are normal and spontaneous venous pulsations are present. Visual fields are full to finger confrontation. Extraocular movements are intact. Trigeminal sensation is intact and the muscles of mastication are normal. The face is symmetric. The palate elevates in the midline. Hearing intact. Voice is normal. Shoulder shrug is normal. The tongue has normal motion without fasciculations.   Coordination:    Normal finger to nose and heel to shin. Normal rapid alternating movements.   Gait:    Heel-toe and gait are normal, can tandem with mild imbalance  Motor Observation:    No asymmetry, no atrophy, and no involuntary movements noted. Tone:    Normal muscle tone.    Posture:    Posture is normal. normal erect      Strength:    Strength is V/V in the upper and lower limbs.      Sensation: intact to LT     Reflex Exam:  DTR's:    Absent AJs otherwise deep tendon reflexes in the upper and lower extremities are brisk bilaterally for age and medical conditions.   Toes:    The toes are downgoing bilaterally.   Clonus:    Clonus is absent.      Assessment/Plan:   82 year old male here for daily morning headaches, also has glaucoma (has not seen eye doctor in 3 years), PMHx v1 shingles on left, chronic sinus issues and hearing loss  left ear, diplopia  - Diplopia, headaches, pmhx v1 shingles and glaucoma : Needs ophthalmology eval, [place] and MRI of the brain to ensure no stroke, masses or other lesions  - Daily morning headaches, snoring: needs sleep evaluation for OSA  - memory loss: MRI brain  - esr, crp, bmp  - Chronic sinus congestion and left hearing loss; ENT referal  Addendum: saw dr Ernesto Rutherford 12/19 dx maxillary, ethmoid, and frontal sinusitis and is being treated  - headaches may have started after prison, unclear maybe after left shingles in V1 distribution. Needs evaluation of his home for causes of allergies and toxins or other causing headaches.  Orders Placed This Encounter  Procedures  . MR BRAIN W WO CONTRAST  . Sedimentation rate  . C-reactive protein  . Basic Metabolic Panel  . Ambulatory referral to Ophthalmology  . Ambulatory referral to Sleep Studies  . Ambulatory referral to ENT     Discussed: To prevent or relieve headaches, try the following: Cool Compress. Lie down and place a cool compress on your head.  Avoid headache triggers. If certain foods or odors seem to have triggered your migraines in the past, avoid them. A headache diary might help you identify triggers.  Include physical activity in your daily routine. Try a daily walk or other moderate aerobic exercise.  Manage stress. Find healthy ways to cope with the stressors, such as delegating tasks on  your to-do list.  Practice relaxation techniques. Try deep breathing, yoga, massage and visualization.  Eat regularly. Eating regularly scheduled meals and maintaining a healthy diet might help prevent headaches. Also, drink plenty of fluids.  Follow a regular sleep schedule. Sleep deprivation might contribute to headaches Consider biofeedback. With this mind-body technique, you learn to control certain bodily functions - such as muscle tension, heart rate and blood pressure - to prevent headaches or reduce headache pain.    Proceed to emergency room if you experience new or worsening symptoms or symptoms do not resolve, if you have new neurologic symptoms or if headache is severe, or for any concerning symptom.    Sarina Ill, MD  [facility] [address] [place], [place] [address]  Phone [phone] Fax [phone]

## 2017-09-02 NOTE — Patient Instructions (Signed)
MRI brain (we will call to schedule) Labs today Ophthalmology and ENT (They will call you for appt) Sleep referral (They will call you for appointment to see our sleep doctor first)

## 2017-09-03 DIAGNOSIS — J040 Acute laryngitis: Secondary | ICD-10-CM | POA: Diagnosis not present

## 2017-09-03 DIAGNOSIS — J322 Chronic ethmoidal sinusitis: Secondary | ICD-10-CM | POA: Diagnosis not present

## 2017-09-03 DIAGNOSIS — H903 Sensorineural hearing loss, bilateral: Secondary | ICD-10-CM | POA: Diagnosis not present

## 2017-09-03 DIAGNOSIS — J320 Chronic maxillary sinusitis: Secondary | ICD-10-CM | POA: Diagnosis not present

## 2017-09-03 DIAGNOSIS — J321 Chronic frontal sinusitis: Secondary | ICD-10-CM | POA: Diagnosis not present

## 2017-09-03 LAB — BASIC METABOLIC PANEL
BUN / CREAT RATIO: 13 (ref 10–24)
BUN: 19 mg/dL (ref 8–27)
CO2: 19 mmol/L — ABNORMAL LOW (ref 20–29)
CREATININE: 1.43 mg/dL — AB (ref 0.76–1.27)
Calcium: 9.2 mg/dL (ref 8.6–10.2)
Chloride: 110 mmol/L — ABNORMAL HIGH (ref 96–106)
GFR, EST AFRICAN AMERICAN: 53 mL/min/{1.73_m2} — AB (ref >59)
GFR, EST NON AFRICAN AMERICAN: 46 mL/min/{1.73_m2} — AB (ref >59)
GLUCOSE: 112 mg/dL — AB (ref 65–99)
Potassium: 4.2 mmol/L (ref 3.5–5.2)
Sodium: 143 mmol/L (ref 134–144)

## 2017-09-03 LAB — C-REACTIVE PROTEIN: CRP: 0.7 mg/L (ref 0.0–4.9)

## 2017-09-03 LAB — SEDIMENTATION RATE: Sed Rate: 29 mm/hr (ref 0–30)

## 2017-09-03 MED FILL — CEFUROXIME AXETIL 250 MG TA: 250 | 10 days supply | Qty: 20 | Fill #0

## 2017-09-04 ENCOUNTER — Telehealth: Payer: Self-pay | Admitting: *Deleted

## 2017-09-04 NOTE — Telephone Encounter (Signed)
-----   Message from Melvenia Beam, MD sent at 09/03/2017 11:11 AM EST ----- He has chronic kidney disease otherwise labs unremarkable thanks

## 2017-09-04 NOTE — Telephone Encounter (Signed)
Called and spoke with patient. He verbalized understanding that his labs show chronic kidney disease otherwise unremarkable. He will f/u with his PCP. Results sent to PCP via EMR.

## 2017-09-12 ENCOUNTER — Ambulatory Visit
Admission: RE | Admit: 2017-09-12 | Discharge: 2017-09-12 | Disposition: A | Discharge status: Home / Self Care | Payer: Medicare Other | Source: Ambulatory Visit | Attending: Neurology | Admitting: Neurology

## 2017-09-12 DIAGNOSIS — H4050X1 Glaucoma secondary to other eye disorders, unspecified eye, mild stage: Secondary | ICD-10-CM

## 2017-09-12 DIAGNOSIS — R51 Headache: Principal | ICD-10-CM

## 2017-09-12 DIAGNOSIS — H532 Diplopia: Secondary | ICD-10-CM

## 2017-09-12 DIAGNOSIS — R519 Headache, unspecified: Secondary | ICD-10-CM

## 2017-09-12 MED ORDER — GADOBENATE DIMEGLUMINE 529 MG/ML IV SOLN
18.0000 mL | Freq: Once | INTRAVENOUS | Status: AC | PRN
  Administered 2017-09-12: 18 mL via INTRAVENOUS

## 2017-09-17 ENCOUNTER — Telehealth: Payer: Self-pay | Admitting: *Deleted

## 2017-09-17 NOTE — Telephone Encounter (Signed)
Called patient and discussed that MRI brain results are normal for his age. He verbalized understanding and appreciation.

## 2017-09-17 NOTE — Telephone Encounter (Signed)
-----   Message from Melvenia Beam, MD sent at 09/17/2017  8:23 AM EST ----- MRI brain normal for age thanks

## 2017-09-19 ENCOUNTER — Other Ambulatory Visit: Payer: Self-pay

## 2017-09-19 ENCOUNTER — Other Ambulatory Visit: Payer: Self-pay | Admitting: Family Medicine

## 2017-09-19 MED FILL — ?ATORVASTATIN 20 MG TABLET: 20 | 10 days supply | Qty: 30 | Fill #0

## 2017-09-19 MED FILL — AMLODIPINE BESYLATE 5 MG TA: 5 | 30 days supply | Qty: 60 | Fill #1

## 2017-09-19 MED FILL — GABAPENTIN 300 MG CAPSULE: 300 | 30 days supply | Qty: 90 | Fill #1

## 2017-09-19 MED FILL — ?ESOMEPRAZOLE MAG DR 40MG C: 40 | 30 days supply | Qty: 30 | Fill #0

## 2017-09-19 MED FILL — ?CETIRIZINE HCL 10 MG TABLE: 10 | 30 days supply | Qty: 30 | Fill #2

## 2017-09-25 MED FILL — ALFUZOSIN HCL ER 10 MG TB24: 10 | 30 days supply | Qty: 30 | Fill #0

## 2017-10-15 ENCOUNTER — Encounter: Payer: Self-pay | Admitting: Neurology

## 2017-10-15 ENCOUNTER — Encounter (INDEPENDENT_AMBULATORY_CARE_PROVIDER_SITE_OTHER): Payer: Self-pay

## 2017-10-15 ENCOUNTER — Ambulatory Visit: Payer: Medicare Other | Admitting: Neurology

## 2017-10-15 VITALS — BP 144/76 | HR 72 | Ht 66.0 in | Wt 192.0 lb

## 2017-10-15 DIAGNOSIS — G4719 Other hypersomnia: Secondary | ICD-10-CM

## 2017-10-15 DIAGNOSIS — E669 Obesity, unspecified: Secondary | ICD-10-CM

## 2017-10-15 DIAGNOSIS — R519 Headache, unspecified: Secondary | ICD-10-CM

## 2017-10-15 DIAGNOSIS — R351 Nocturia: Secondary | ICD-10-CM

## 2017-10-15 DIAGNOSIS — R51 Headache: Secondary | ICD-10-CM

## 2017-10-15 DIAGNOSIS — R0683 Snoring: Secondary | ICD-10-CM | POA: Diagnosis not present

## 2017-10-15 NOTE — Patient Instructions (Signed)
Thank you for choosing [facility] for your sleep related care! It was nice to meet you today! I appreciate that you entrust me with your sleep related healthcare concerns. I hope, I was able to address at least some of your concerns today, and that I can help you feel reassured and also get better.    Here is what we discussed today and what we came up with as our plan for you:    Based on your symptoms and your exam I believe you are at risk for obstructive sleep apnea or OSA, and I think we should proceed with a sleep study to determine whether you do or do not have OSA and how severe it is. If you have more than mild OSA, I want you to consider treatment with CPAP. Please remember, the risks and ramifications of moderate to severe obstructive sleep apnea or OSA are: Cardiovascular disease, including congestive heart failure, stroke, difficult to control hypertension, arrhythmias, and even type 2 diabetes has been linked to untreated OSA. Sleep apnea causes disruption of sleep and sleep deprivation in most cases, which, in turn, can cause recurrent headaches, problems with memory, mood, concentration, focus, and vigilance. Most people with untreated sleep apnea report excessive daytime sleepiness, which can affect their ability to drive. Please do not drive if you feel sleepy.   I will likely see you back after your sleep study to go over the test results and where to go from there. We will call you after your sleep study to advise about the results (most likely, you will hear from Kristen, my nurse) and to set up an appointment at the time, as necessary.    Our sleep lab administrative assistan will call you to schedule your sleep study. If you don't hear back from her by about 2 weeks from now, please feel free to call her at [phone]. You can leave a message with your phone number and concerns, if you get the voicemail box. She will call back as soon as possible.   

## 2017-10-15 NOTE — Progress Notes (Signed)
Subjective:    Patient ID: Jorge Martin is a 83 y.o. male.  HPI     Star Age, MD, PhD [facility] [address] [place], [place] [address]  Dear Berta Minor,   I saw your patient, Jorge Martin, upon your kind request in my clinic today for initial consultation of his sleep disorder, in particular, concern for underlying obstructive sleep apnea. The patient is unaccompanied today. As you know, Jorge Martin is an 83 year old right-handed gentleman with an underlying medical history of hypertension, hyperlipidemia, glaucoma, prediabetes, reflux disease recurrent headaches and borderline obesity, who reports snoring and daytime somnolence. I reviewed your office note from 09/02/2017. He reports having had a sleep study many years ago, results unknown. His Epworth sleepiness score is 13 out of 24 today, fatigue score is 33 out of 63. He is widowed and lives alone. He has 5 grandchildren. He has worked in [employer], currently works part-time in [employer]. His wife died 2 years ago. He was incarcerated for 5 years. He is a nonsmoker and does not drink alcohol currently, has a history of heavy drinking in the past by self-report, drinks caffeine in the form of coffee, 2 cups per day on average. He does not have a Hx of migraine. No FHx of OSA. He has a bedtime of around 8:30 to 9. He has a TV on in the bedroom which tends to stay on all night. His rise time is around 3. He does have significant nocturia about 3 times per average night, he has had some morning headaches as well.  His Past Medical History Is Significant For: Past Medical History:  Diagnosis Date  . Gout   . Headache   . Hypercholesteremia   . Hypertension     His Past Surgical History Is Significant For: No past surgical history on file.  His Family History Is Significant For: Family History  Problem Relation Age of Onset  . Hyperlipidemia Mother   . Hyperlipidemia Father      His Social History Is Significant For: Social History   Socioeconomic History  . Marital status: Widowed    Spouse name: None  . Number of children: 8  . Years of education: 6th grade  . Highest education level: None  Social Needs  . Financial resource strain: None  . Food insecurity - worry: None  . Food insecurity - inability: None  . Transportation needs - medical: None  . Transportation needs - non-medical: None  Occupational History  . None  Tobacco Use  . Smoking status: Never Smoker  . Smokeless tobacco: Never Used  Substance and Sexual Activity  . Alcohol use: No  . Drug use: No  . Sexual activity: None  Other Topics Concern  . None  Social History Narrative   He lives at home alone   Drinks 2 cups of coffee daily    Right handed    His Allergies Are:  No Known Allergies:   His Current Medications Are:  Outpatient Encounter Medications as of 10/15/2017  Medication Sig  . amLODipine (NORVASC) 5 MG tablet Take 2 tablets (10 mg total) by mouth daily.  [name] atorvastatin (LIPITOR) 20 MG tablet TAKE 1 TABLET BY MOUTH DAILY.  . cetirizine (ZYRTEC) 10 MG tablet Take 10 mg by mouth at bedtime.  [name] esomeprazole (NEXIUM) 40 MG capsule TAKE 1 CAPSULE BY MOUTH DAILY BEFORE BREAKFAST.  . tamsulosin (FLOMAX) 0.4 MG CAPS capsule TAKE 1 CAPSULE BY MOUTH DAILY AFTER BREAKFAST.  [name]  triamcinolone (NASACORT) 55 MCG/ACT AERO nasal inhaler Place 2 sprays into the nose daily.  . [DISCONTINUED] gabapentin (NEURONTIN) 300 MG capsule Take 1 capsule (300 mg total) by mouth 3 (three) times daily.   No facility-administered encounter medications on file as of 10/15/2017.   : Review of Systems:  Out of a complete 14 point review of systems, all are reviewed and negative with the exception of these symptoms as listed below: Review of Systems  Neurological:       Pt presents today to discuss his sleep. Pt had a sleep study "many years ago" and does endorse snoring.  Epworth Sleepiness  Scale 0= would never doze 1= slight chance of dozing 2= moderate chance of dozing 3= high chance of dozing  Sitting and reading: 0 Watching TV: 3 Sitting inactive in a public place (ex. Theater or meeting): 2 As a passenger in a car for an hour without a break: 2 Lying down to rest in the afternoon: 3 Sitting and talking to someone: 0 Sitting quietly after lunch (no alcohol): 0 In a car, while stopped in traffic: 3 Total: 13     Objective:  Neurological Exam  Physical Exam Physical Examination:   Vitals:   10/15/17 1553  BP: (!) 144/76  Pulse: 72    General Examination: The patient is a very pleasant 83 y.o. male in no acute distress. He appears well-developed and well-nourished and well groomed.   HEENT: Normocephalic, atraumatic, pupils are equal, round and reactive to light and accommodation. Extraocular tracking is good without limitation to gaze excursion or nystagmus noted. Normal smooth pursuit is noted. Hearing is grossly intact. Face is symmetric with normal facial animation and normal facial sensation. Speech is clear with no dysarthria noted. There is no hypophonia. There is no lip, neck/head, jaw or voice tremor. Neck is supple with full range of passive and active motion. There are no carotid bruits on auscultation. Oropharynx exam reveals: mild mouth dryness, adequate dental hygiene with several teeth missing, and moderate airway crowding secondary to larger tongue, redundant soft palate, large uvula and tonsils in place. Tonsils were not fully visualized. Mallampati is class III. Neck circumference is 16-1/4 inches.  Chest: Clear to auscultation without wheezing, rhonchi or crackles noted.  Heart: S1+S2+0, regular and normal without murmurs, rubs or gallops noted.   Abdomen: Soft, non-tender and non-distended with normal bowel sounds appreciated on auscultation.  Extremities: There is no pitting edema in the distal lower extremities bilaterally. Pedal pulses are  intact.  Skin: Warm and dry without trophic changes noted.  Musculoskeletal: exam reveals no obvious joint deformities, tenderness or joint swelling or erythema.   Neurologically:  Mental status: The patient is awake, alert and oriented in all 4 spheres. His immediate and remote memory, attention, language skills and fund of knowledge are appropriate. There is no evidence of aphasia, agnosia, apraxia or anomia. Speech is clear with normal prosody and enunciation. Thought process is linear. Mood is normal and affect is normal.  Cranial nerves II - XII are as described above under HEENT exam. In addition: shoulder shrug is normal with equal shoulder height noted. Motor exam: Normal bulk, strength and tone is noted. There is no drift, tremor or rebound. Romberg is negative. Reflexes are 1+ throughout. Fine motor skills and coordination: intact with normal finger taps, normal hand movements, normal rapid alternating patting, normal foot taps and normal foot agility.  Cerebellar testing: No dysmetria or intention tremor on finger to nose testing. Heel to shin is  unremarkable bilaterally. There is no truncal or gait ataxia.  Sensory exam: intact to light touch in the upper and lower extremities.  Gait, station and balance: He stands easily. No veering to one side is noted. No leaning to one side is noted. Posture is age-appropriate and stance is narrow based. Gait shows normal stride length and normal pace. No problems turning are noted.   Assessment and Plan:  In summary, Jorge Martin is a very pleasant 83 y.o.-year old male with an underlying medical history of hypertension, hyperlipidemia, glaucoma, prediabetes, reflux disease recurrent headaches and borderline obesity, whose history and physical exam are concerning for obstructive sleep apnea (OSA). I had a long chat with the patient about my findings and the diagnosis of OSA, its prognosis and treatment options. We talked about medical treatments,  surgical interventions and non-pharmacological approaches. I explained in particular the risks and ramifications of untreated moderate to severe OSA, especially with respect to developing cardiovascular disease down the Road, including congestive heart failure, difficult to treat hypertension, cardiac arrhythmias, or stroke. Even type 2 diabetes has, in part, been linked to untreated OSA. Symptoms of untreated OSA include daytime sleepiness, memory problems, mood irritability and mood disorder such as depression and anxiety, lack of energy, as well as recurrent headaches, especially morning headaches. We talked about trying to maintain a healthy lifestyle in general, as well as the importance of weight control. I encouraged the patient to eat healthy, exercise daily and keep well hydrated, to keep a scheduled bedtime and wake time routine, to not skip any meals and eat healthy snacks in between meals. I advised the patient not to drive when feeling sleepy. I recommended the following at this time: sleep study with potential positive airway pressure titration. (We will score hypopneas at 4%).   I explained the sleep test procedure to the patient and also outlined possible surgical and non-surgical treatment options of OSA, including the use of a custom-made dental device (which would require a referral to a specialist dentist or oral surgeon), upper airway surgical options, such as pillar implants, radiofrequency surgery, tongue base surgery, and UPPP (which would involve a referral to an ENT surgeon). Rarely, jaw surgery such as mandibular advancement may be considered.  I also explained the CPAP treatment option to the patient, who indicated that he would be willing to try CPAP if the need arises. I explained the importance of being compliant with PAP treatment, not only for insurance purposes but primarily to improve His symptoms, and for the patient's long term health benefit, including to reduce His  cardiovascular risks. I answered all him questions today and the patient was in agreement. I will likely see him back after the sleep study is completed and encouraged him to call with any interim questions, concerns, problems or updates.   Thank you very much for allowing me to participate in the care of this nice patient. If I can be of any further assistance to you please do not hesitate to talk to me.  Sincerely,   Star Age, MD, PhD

## 2017-10-29 ENCOUNTER — Other Ambulatory Visit: Payer: Self-pay | Admitting: Family Medicine

## 2017-10-29 MED FILL — ?ATORVASTATIN 20MG TABL: 20 | 10 days supply | Qty: 30 | Fill #1

## 2017-10-29 MED FILL — ?ESOMEPRAZOLE MAG DR 40MG C: 40 | 30 days supply | Qty: 30 | Fill #1

## 2017-10-29 MED FILL — AMLODIPINE BESYLATE 5 MG TA: 5 | 30 days supply | Qty: 60 | Fill #2

## 2017-10-29 MED FILL — ALFUZOSIN HCL ER 10 MG TAB: 10 | 30 days supply | Qty: 30 | Fill #1

## 2017-11-11 ENCOUNTER — Ambulatory Visit: Admitting: Family Medicine

## 2017-11-12 MED FILL — ALFUZOSIN HCL ER 10 MG TAB: 10 | 30 days supply | Qty: 60 | Fill #0

## 2017-11-17 MED FILL — levoFLOXacin 750 MG TABS: 750 | 1 days supply | Qty: 1 | Fill #0

## 2017-11-18 DIAGNOSIS — C61 Malignant neoplasm of prostate: Secondary | ICD-10-CM | POA: Diagnosis not present

## 2017-11-18 DIAGNOSIS — R972 Elevated prostate specific antigen [PSA]: Secondary | ICD-10-CM | POA: Diagnosis not present

## 2017-11-21 ENCOUNTER — Ambulatory Visit (INDEPENDENT_AMBULATORY_CARE_PROVIDER_SITE_OTHER): Payer: Medicare Other | Admitting: Family Medicine

## 2017-11-21 ENCOUNTER — Encounter: Payer: Self-pay | Admitting: Family Medicine

## 2017-11-21 VITALS — BP 110/86 | HR 80 | Temp 98.9°F | Ht 66.0 in | Wt 189.0 lb

## 2017-11-21 DIAGNOSIS — I10 Essential (primary) hypertension: Secondary | ICD-10-CM

## 2017-11-21 DIAGNOSIS — R7303 Prediabetes: Secondary | ICD-10-CM

## 2017-11-21 DIAGNOSIS — R972 Elevated prostate specific antigen [PSA]: Secondary | ICD-10-CM | POA: Diagnosis not present

## 2017-11-21 DIAGNOSIS — Z114 Encounter for screening for human immunodeficiency virus [HIV]: Secondary | ICD-10-CM

## 2017-11-21 DIAGNOSIS — Z113 Encounter for screening for infections with a predominantly sexual mode of transmission: Secondary | ICD-10-CM

## 2017-11-21 LAB — POCT URINALYSIS DIP (DEVICE)
Bilirubin Urine: NEGATIVE
Glucose, UA: NEGATIVE mg/dL
Ketones, ur: NEGATIVE mg/dL
LEUKOCYTES UA: NEGATIVE
Nitrite: NEGATIVE
PH: 6 (ref 5.0–8.0)
Protein, ur: 30 mg/dL — AB
Urobilinogen, UA: 1 mg/dL (ref 0.0–1.0)

## 2017-11-21 NOTE — Patient Instructions (Addendum)
No changes in blood pressure medications today.  Continue to remain physically active.    Sexually Transmitted Disease A sexually transmitted disease (STD) is a disease or infection that may be passed (transmitted) from person to person, usually during sexual activity. This may happen by way of saliva, semen, blood, vaginal mucus, or urine. Common STDs include:  Gonorrhea.  Chlamydia.  Syphilis.  HIV and AIDS.  Genital herpes.  Hepatitis B and C.  Trichomonas.  Human papillomavirus (HPV).  Pubic lice.  Scabies.  Mites.  Bacterial vaginosis.  What are the causes? An STD may be caused by bacteria, a virus, or parasites. STDs are often transmitted during sexual activity if one person is infected. However, they may also be transmitted through nonsexual means. STDs may be transmitted after:  Sexual intercourse with an infected person.  Sharing sex toys with an infected person.  Sharing needles with an infected person or using unclean piercing or tattoo needles.  Having intimate contact with the genitals, mouth, or rectal areas of an infected person.  Exposure to infected fluids during birth.  What are the signs or symptoms? Different STDs have different symptoms. Some people may not have any symptoms. If symptoms are present, they may include:  Painful or bloody urination.  Pain in the pelvis, abdomen, vagina, anus, throat, or eyes.  A skin rash, itching, or irritation.  Growths, ulcerations, blisters, or sores in the genital and anal areas.  Abnormal vaginal discharge with or without bad odor.  Penile discharge in men.  Fever.  Pain or bleeding during sexual intercourse.  Swollen glands in the groin area.  Yellow skin and eyes (jaundice). This is seen with hepatitis.  Swollen testicles.  Infertility.  Sores and blisters in the mouth.  How is this diagnosed? To make a diagnosis, your health care provider may:  Take a medical history.  Perform  a physical exam.  Take a sample of any discharge to examine.  Swab the throat, cervix, opening to the penis, rectum, or vagina for testing.  Test a sample of your first morning urine.  Perform blood tests.  Perform a Pap test, if this applies.  Perform a colposcopy.  Perform a laparoscopy.  How is this treated? Treatment depends on the STD. Some STDs may be treated but not cured.  Chlamydia, gonorrhea, trichomonas, and syphilis can be cured with antibiotic medicine.  Genital herpes, hepatitis, and HIV can be treated, but not cured, with prescribed medicines. The medicines lessen symptoms.  Genital warts from HPV can be treated with medicine or by freezing, burning (electrocautery), or surgery. Warts may come back.  HPV cannot be cured with medicine or surgery. However, abnormal areas may be removed from the cervix, vagina, or vulva.  If your diagnosis is confirmed, your recent sexual partners need treatment. This is true even if they are symptom-free or have a negative culture or evaluation. They should not have sex until their health care providers say it is okay.  Your health care provider may test you for infection again 3 months after treatment.  How is this prevented? Take these steps to reduce your risk of getting an STD:  Use latex condoms, dental dams, and water-soluble lubricants during sexual activity. Do not use petroleum jelly or oils.  Avoid having multiple sex partners.  Do not have sex with someone who has other sex partners.  Do not have sex with anyone you do not know or who is at high risk for an STD.  Avoid risky sex  practices that can break your skin.  Do not have sex if you have open sores on your mouth or skin.  Avoid drinking too much alcohol or taking illegal drugs. Alcohol and drugs can affect your judgment and put you in a vulnerable position.  Avoid engaging in oral and anal sex acts.  Get vaccinated for HPV and hepatitis. If you have not  received these vaccines in the past, talk to your health care provider about whether one or both might be right for you.  If you are at risk of being infected with HIV, it is recommended that you take a prescription medicine daily to prevent HIV infection. This is called pre-exposure prophylaxis (PrEP). You are considered at risk if: ? You are a man who has sex with other men (MSM). ? You are a heterosexual man or woman and are sexually active with more than one partner. ? You take drugs by injection. ? You are sexually active with a partner who has HIV.  Talk with your health care provider about whether you are at high risk of being infected with HIV. If you choose to begin PrEP, you should first be tested for HIV. You should then be tested every 3 months for as long as you are taking PrEP.  Contact a health care provider if:  See your health care provider.  Tell your sexual partner(s). They should be tested and treated for any STDs.  Do not have sex until your health care provider says it is okay. Get help right away if: Contact your health care provider right away if:  You have severe abdominal pain.  You are a man and notice swelling or pain in your testicles.  You are a woman and notice swelling or pain in your vagina.  This information is not intended to replace advice given to you by your health care provider. Make sure you discuss any questions you have with your health care provider. Document Released: 11/23/2002 Document Revised: 03/22/2016 Document Reviewed: 03/23/2013 Elsevier Interactive Patient Education  2018 [employer].

## 2017-11-21 NOTE — Progress Notes (Signed)
Patient ID: Jorge Martin, male    DOB: [birth year], 83 y.o.   MRN: [id number]  PCP: Jorge Jun, FNP  Chief Complaint  Patient presents with  . Follow-up    3 month on chronic condition    Subjective:  HPI Jorge Martin is a 83 y.o. male with hypertension, elevated PSA, presents for evaluation chronic conditions and request STI testing today.  Patient reports that he recently had sexual intercourse unprotected with a new male partner and has experienced some urinary burning and request STI testing.  He is unaware of any exposure and request testing just to be proactive. He reports consistently taken blood pressure medications.  He reports that he has been recently more physically active. He is not having any chest pain, shortness of breath, edema, weakness, or dizziness. He has recently undergone a prostate biopsy for further evaluation of persistently elevated PSA level. [facility] is [facility].  Jorge Martin has had persistently morning awakening headaches for an extended period of time.  He was referred to [facility] for further evaluation of headaches and trialed on gabapentin headaches did not resolve the medication was stopped.He is scheduled for a sleep study on 11/25/2017 as this is thought to be the cause of the chronic headaches. He has no other complaints today. Social History   Socioeconomic History  . Marital status: Widowed    Spouse name: Not on file  . Number of children: 8  . Years of education: 6th grade  . Highest education level: Not on file  Social Needs  . Financial resource strain: Not on file  . Food insecurity - worry: Not on file  . Food insecurity - inability: Not on file  . Transportation needs - medical: Not on file  . Transportation needs - non-medical: Not on file  Occupational History  . Not on file  Tobacco Use  . Smoking status: Never Smoker  . Smokeless tobacco: Never Used  Substance and Sexual Activity  .  Alcohol use: No  . Drug use: No  . Sexual activity: Not on file  Other Topics Concern  . Not on file  Social History Narrative   He lives at home alone   Drinks 2 cups of coffee daily    Right handed    Family History  Problem Relation Age of Onset  . Hyperlipidemia Mother   . Hyperlipidemia Father    Review of Systems Pertinent negatives indicated in HPI. Patient Active Problem List   Diagnosis Date Noted  . HTN (hypertension) 02/04/2017  . Acid reflux 02/04/2017  . Glaucoma 02/04/2017    No Known Allergies  Prior to Admission medications   Medication Sig Start Date End Date Taking? Authorizing Provider  amLODipine (NORVASC) 5 MG tablet Take 2 tablets (10 mg total) by mouth daily. 08/18/17  Yes Jorge Jun, FNP  atorvastatin (LIPITOR) 20 MG tablet TAKE 1 TABLET BY MOUTH DAILY. 09/19/17  Yes Jorge Jun, FNP  cetirizine (ZYRTEC) 10 MG tablet Take 10 mg by mouth at bedtime.   Yes [provider]  esomeprazole (NEXIUM) 40 MG capsule TAKE 1 CAPSULE BY MOUTH DAILY BEFORE BREAKFAST. 09/19/17  Yes Jorge Jun, FNP  tamsulosin (FLOMAX) 0.4 MG CAPS capsule TAKE 1 CAPSULE BY MOUTH DAILY AFTER BREAKFAST. 09/19/17  Yes Jorge Jun, FNP  triamcinolone (NASACORT) 55 MCG/ACT AERO nasal inhaler Place 2 sprays into the nose daily. 08/11/17  Yes Jorge Jun, FNP    Past  Medical, Surgical Family and Social History reviewed and updated.    Objective:   Today's Vitals   11/21/17 1320  BP: (!) 108/94  Pulse: 80  Temp: 98.9 F (37.2 C)  TempSrc: Oral  SpO2: 98%  Weight: 189 lb (85.7 kg)  Height: 5\' 6"  (1.676 m)    Wt Readings from Last 3 Encounters:  11/21/17 189 lb (85.7 kg)  10/15/17 192 lb (87.1 kg)  09/02/17 190 lb (86.2 kg)   Physical Exam Constitutional: Patient appears well-developed and well-nourished. No distress. HENT: Normocephalic, atraumatic, External right and left ear normal. Oropharynx is clear and moist.  Eyes: Conjunctivae  and EOM are normal. PERRLA, no scleral icterus. Neck: Normal ROM. Neck supple. No JVD. No tracheal deviation. No thyromegaly. CVS: RRR, S1/S2 +, no murmurs, no gallops, no carotid bruit.  Pulmonary: Effort and breath sounds normal, no stridor, rhonchi, wheezes, rales.  Musculoskeletal: Normal range of motion. No edema and no tenderness.  Lymphadenopathy: No lymphadenopathy noted, cervical, inguinal or axillary Neuro: Alert. Normal reflexes, muscle tone coordination.  Skin: Skin is warm and dry. No rash noted. Not diaphoretic. No erythema. No pallor. Psychiatric: Normal mood and affect. Behavior, judgment, thought content normal.   Assessment & Plan:  1. Prediabetes, A1C today 6.0. Stable.  Continue lifestyle changes including engaging in routine physical activity as tolerated, avoiding increased intake of starchy or sugar sweetened beverages.  2. Routine screening for STI (sexually transmitted infection), patient recently had unprotected sex with a new partner and is having some mild dysuria.  Will check gonorrhea, chlamydia, RPR, and HIV.   3. Screening for HIV (human immunodeficiency virus),   4. Elevated PSA, recently underwent a prostate biopsy-results  pending.  This was performed at [facility].   [facility]  5. Essential hypertension, blood pressure is low systolically today.  However no changes to medication regimen indicated today.  Advised patient to occasionally check his blood pressure and notify us here at the office if he develops any dizziness. We have discussed target BP range and blood pressure goal. I have advised patient to check BP regularly and to call us back or report to clinic if the numbers are consistently higher than 140/90. We discussed the importance of compliance with medical therapy and DASH diet recommended, consequences of uncontrolled hypertension discussed.    Orders Placed This Encounter  Procedures  . GC/Chlamydia Probe Amp     Order Specific Question:   Source    Answer:   urine  . RPR  . HIV antibody (with reflex)  . POCT urinalysis dip (device)    RTC: 6 months for chronic condition follow-up.   Carroll Sage. Kenton Kingfisher, MSN, FNP-C [facility]  [address] [place], [place] [address] [phone]

## 2017-11-22 LAB — HIV ANTIBODY (ROUTINE TESTING W REFLEX): HIV Screen 4th Generation wRfx: NONREACTIVE

## 2017-11-22 LAB — RPR: RPR: NONREACTIVE

## 2017-11-23 LAB — GC/CHLAMYDIA PROBE AMP
CHLAMYDIA, DNA PROBE: NEGATIVE
NEISSERIA GONORRHOEAE BY PCR: NEGATIVE

## 2017-11-25 ENCOUNTER — Ambulatory Visit (INDEPENDENT_AMBULATORY_CARE_PROVIDER_SITE_OTHER): Payer: Medicare Other | Admitting: Neurology

## 2017-11-25 DIAGNOSIS — R0683 Snoring: Secondary | ICD-10-CM

## 2017-11-25 DIAGNOSIS — R51 Headache: Secondary | ICD-10-CM

## 2017-11-25 DIAGNOSIS — E669 Obesity, unspecified: Secondary | ICD-10-CM

## 2017-11-25 DIAGNOSIS — G4733 Obstructive sleep apnea (adult) (pediatric): Secondary | ICD-10-CM

## 2017-11-25 DIAGNOSIS — G4719 Other hypersomnia: Secondary | ICD-10-CM

## 2017-11-25 DIAGNOSIS — R519 Headache, unspecified: Secondary | ICD-10-CM

## 2017-11-25 DIAGNOSIS — R351 Nocturia: Secondary | ICD-10-CM

## 2017-11-25 DIAGNOSIS — G4720 Circadian rhythm sleep disorder, unspecified type: Secondary | ICD-10-CM

## 2017-11-28 ENCOUNTER — Other Ambulatory Visit: Payer: Self-pay | Admitting: Family Medicine

## 2017-11-28 ENCOUNTER — Other Ambulatory Visit: Payer: Self-pay | Admitting: Urology

## 2017-11-28 ENCOUNTER — Encounter: Payer: Self-pay | Admitting: Radiation Oncology

## 2017-11-28 DIAGNOSIS — C61 Malignant neoplasm of prostate: Secondary | ICD-10-CM

## 2017-11-28 MED FILL — ATORVASTATIN 20 MG TABLET: 20 | 30 days supply | Qty: 30 | Fill #0

## 2017-11-28 MED FILL — AMLODIPINE BESYLATE 5 MG TA: 5 | 30 days supply | Qty: 60 | Fill #0

## 2017-12-01 ENCOUNTER — Other Ambulatory Visit: Payer: Self-pay | Admitting: Neurology

## 2017-12-01 ENCOUNTER — Telehealth: Payer: Self-pay

## 2017-12-01 DIAGNOSIS — E669 Obesity, unspecified: Secondary | ICD-10-CM

## 2017-12-01 DIAGNOSIS — R519 Headache, unspecified: Secondary | ICD-10-CM

## 2017-12-01 DIAGNOSIS — G4719 Other hypersomnia: Secondary | ICD-10-CM

## 2017-12-01 DIAGNOSIS — G4733 Obstructive sleep apnea (adult) (pediatric): Secondary | ICD-10-CM

## 2017-12-01 DIAGNOSIS — R51 Headache: Secondary | ICD-10-CM

## 2017-12-01 NOTE — Procedures (Signed)
PATIENT'S NAME:  Jorge Martin, Jorge Martin DOB:      1935/06/24      MR#:    [id number]     DATE OF RECORDING: 11/25/2017 REFERRING M.D.: Dr. Sarina Ill,      PCP: Molli Barrows FNP Study Performed:   Baseline Polysomnogram HISTORY: 83 year old man with a history of hypertension, hyperlipidemia, glaucoma, prediabetes, reflux disease recurrent headaches and borderline obesity, who reports snoring and daytime somnolence. The patient endorsed the Epworth Sleepiness Scale at 13/24 points. The patient's weight 192 pounds with a height of 66 (inches), resulting in a BMI of 30.8 kg/m2. The patient's neck circumference measured 16.1 inches.  CURRENT MEDICATIONS: Amlodipine, Atorvastatin, Cetirizine, Esomeprazole, Tamsulosin and Triamcinolone.   PROCEDURE:  This is a multichannel digital polysomnogram utilizing the Somnostar 11.2 system.  Electrodes and sensors were applied and monitored per AASM Specifications.   EEG, EOG, Chin and Limb EMG, were sampled at 200 Hz.  ECG, Snore and Nasal Pressure, Thermal Airflow, Respiratory Effort, CPAP Flow and Pressure, Oximetry was sampled at 50 Hz. Digital video and audio were recorded.      BASELINE STUDY  Lights Out was at 20:57 and Lights On at 04:59.  Total recording time (TRT) was 481.5 minutes, with a total sleep time (TST) of  278.5 minutes.   The patient's sleep latency was 19 minutes.  REM latency was 64 minutes.  The sleep efficiency was 57.8 %, which is reduced.     SLEEP ARCHITECTURE: WASO (Wake after sleep onset) was 201.5 minutes with overall mild sleep fragmentation noted and one long period of wakefulness of about 3 hours. There were 16.5 minutes in Stage N1, 211.5 minutes Stage N2, 0 minutes Stage N3 and 50.5 minutes in Stage REM.  The percentage of Stage N1 was 5.9%, Stage N2 was 75.9%, which is increased, Stage N3 was absent, and Stage R (REM sleep) was 18.1%, which is normal. The arousals were noted as: 28 were spontaneous, 1 were associated with PLMs,  132 were associated with respiratory events.  Audio and video analysis did not show any abnormal or unusual movements, behaviors, phonations or vocalizations. The patient took 2 bathroom breaks. Mild to moderate snoring was noted. The EKG was in keeping with normal sinus rhythm (NSR).  RESPIRATORY ANALYSIS:  There were a total of 132 respiratory events:  49 obstructive apneas, 1 central apneas and 0 mixed apneas with a total of 50 apneas and an apnea index (AI) of 10.8 /hour. There were 82 hypopneas with a hypopnea index of 17.7 /hour. The patient also had 0 respiratory event related arousals (RERAs).      The total APNEA/HYPOPNEA INDEX (AHI) was 28.4/hour and the total RESPIRATORY DISTURBANCE INDEX was 28.4 /hour.  32 events occurred in REM sleep and 131 events in NREM. The REM AHI was 38. /hour, versus a non-REM AHI of 26.3. The patient spent 64.5 minutes of total sleep time in the supine position and 214 minutes in non-supine.. The supine AHI was 32.6 versus a non-supine AHI of 27.2.  OXYGEN SATURATION & C02:  The Wake baseline 02 saturation was 93%, with the lowest being 76%. Time spent below 89% saturation equaled 62 minutes.  PERIODIC LIMB MOVEMENTS: The patient had a total of 5 Periodic Limb Movements.  The Periodic Limb Movement (PLM) index was 1.1 and the PLM Arousal index was .2/hour.  Post-study, the patient indicated that sleep was the same as usual.   IMPRESSION:  1. Obstructive Sleep Apnea (OSA) 2. Dysfunctions associated with sleep stages  or arousal from sleep  RECOMMENDATIONS:  1. This study demonstrates moderate to severe obstructive sleep apnea, with a total AHI of 28.4/hour, REM AHI of 38/hour, supine AHI of 32.6/hour and O2 nadir of 76%. Treatment with positive airway pressure in the form of CPAP is recommended. This will require a full night titration study to optimize therapy. Other treatment options may include avoidance of supine sleep position along with weight loss,  upper airway or jaw surgery in selected patients or the use of an oral appliance in certain patients. ENT evaluation and/or consultation with a maxillofacial surgeon or dentist may be feasible in some instances.    2. Please note that untreated obstructive sleep apnea carries additional perioperative morbidity. Patients with significant obstructive sleep apnea should receive perioperative PAP therapy and the surgeons and particularly the anesthesiologist should be informed of the diagnosis and the severity of the sleep disordered breathing. 3. This study shows sleep fragmentation and abnormal sleep stage percentages; these are nonspecific findings and per se do not signify an intrinsic sleep disorder or a cause for the patient's sleep-related symptoms. Causes include (but are not limited to) the first night effect of the sleep study, circadian rhythm disturbances, medication effect or an underlying mood disorder or medical problem.  4. The patient should be cautioned not to drive, work at heights, or operate dangerous or heavy equipment when tired or sleepy. Review and reiteration of good sleep hygiene measures should be pursued with any patient. 5. The patient will be seen in follow-up by Dr. Rexene Alberts at [facility] for discussion of the test results and further management strategies. The referring provider will be notified of the test results.  I certify that I have reviewed the entire raw data recording prior to the issuance of this report in accordance with the Standards of Accreditation of the American Academy of Sleep Medicine (AASM)   Star Age, MD, PhD Diplomat, American Board of Psychiatry and Neurology (Neurology and Sleep Medicine)

## 2017-12-01 NOTE — Progress Notes (Signed)
Patient referred by Dr. Jaynee Eagles, seen by me on 10/15/17, diagnostic PSG on 11/25/17.   Please call and notify the patient that the recent sleep study showed moderate to severe obstructive sleep apnea. I recommend treatment for this in the form of CPAP. This will require a repeat sleep study for proper titration and mask fitting and correct monitoring of the oxygen saturations. Please explain to patient. I have placed an order in the chart. Thanks.  Star Age, MD, PhD [facility])

## 2017-12-01 NOTE — Telephone Encounter (Signed)

## 2017-12-01 NOTE — Telephone Encounter (Signed)
-----   Message from Star Age, MD sent at 12/01/2017  8:20 AM EDT ----- Patient referred by Dr. Jaynee Eagles, seen by me on 10/15/17, diagnostic PSG on 11/25/17.   Please call and notify the patient that the recent sleep study showed moderate to severe obstructive sleep apnea. I recommend treatment for this in the form of CPAP. This will require a repeat sleep study for proper titration and mask fitting and correct monitoring of the oxygen saturations. Please explain to patient. I have placed an order in the chart. Thanks.  Star Age, MD, PhD [facility])

## 2017-12-02 NOTE — Telephone Encounter (Signed)
Pt's daughter, Suanne Marker per DPR, called and asked me to explain pt's sleep study results to her. Pt's daughter was thankful for my time and verbalized understanding of results.

## 2017-12-10 ENCOUNTER — Encounter (HOSPITAL_COMMUNITY)
Admission: RE | Admit: 2017-12-10 | Discharge: 2017-12-10 | Disposition: A | Discharge status: Home / Self Care | Payer: Medicare Other | Source: Ambulatory Visit | Attending: Urology | Admitting: Urology

## 2017-12-10 DIAGNOSIS — C61 Malignant neoplasm of prostate: Secondary | ICD-10-CM | POA: Insufficient documentation

## 2017-12-10 MED ORDER — TECHNETIUM TC 99M MEDRONATE IV KIT
22.5000 | PACK | Freq: Once | INTRAVENOUS | Status: AC | PRN
  Administered 2017-12-10: 22.5 via INTRAVENOUS

## 2017-12-18 MED FILL — ALFUZOSIN HCL ER 10 MG TAB: 10 | 30 days supply | Qty: 60 | Fill #1

## 2017-12-23 ENCOUNTER — Encounter: Payer: Self-pay | Admitting: Radiation Oncology

## 2017-12-23 NOTE — Progress Notes (Signed)
GU Location of Tumor / Histology: prostatic adenocarcinoma  If Prostate Cancer, Gleason Score is (4 + 3) and PSA is (16.9). Prostate volume: 124 grams  Jorge Martin presented to Dr. Alyson Ingles in February 2019 for further evaluation of an elevated PSA. Patient was referred by his PCP, Molli Barrows. Patient was told for the first time his PSA was elevated in 2005.   Biopsies of prostate (if applicable) revealed:    Past/Anticipated interventions by urology, if any: prostate biopsy (x3), bone scan (negative), referral to radiation oncology  Past/Anticipated interventions by medical oncology, if any: no  Weight changes, if any: no  Bowel/Bladder complaints, if any: Reports difficulty emptying his bladder completely, urgency, frequency, weak stream, and mild dysuria x2 years. Denies hematuria, leakage or incontinence.  Nausea/Vomiting, if any: no  Pain issues, if any:  no  SAFETY ISSUES:  Prior radiation? no  Pacemaker/ICD? no  Possible current pregnancy? no  Is the patient on methotrexate? no  Current Complaints / other details:  83 year old male. Widowed. Retired from work. Five children.

## 2017-12-24 ENCOUNTER — Ambulatory Visit
Admission: RE | Admit: 2017-12-24 | Discharge: 2017-12-24 | Disposition: A | Discharge status: Home / Self Care | Payer: Medicare Other | Source: Ambulatory Visit | Attending: Radiation Oncology | Admitting: Radiation Oncology

## 2017-12-24 ENCOUNTER — Encounter: Payer: Self-pay | Admitting: Radiation Oncology

## 2017-12-24 DIAGNOSIS — Z79899 Other long term (current) drug therapy: Secondary | ICD-10-CM | POA: Diagnosis not present

## 2017-12-24 DIAGNOSIS — E7800 Pure hypercholesterolemia, unspecified: Secondary | ICD-10-CM | POA: Insufficient documentation

## 2017-12-24 DIAGNOSIS — C61 Malignant neoplasm of prostate: Secondary | ICD-10-CM | POA: Insufficient documentation

## 2017-12-24 DIAGNOSIS — M109 Gout, unspecified: Secondary | ICD-10-CM | POA: Insufficient documentation

## 2017-12-24 DIAGNOSIS — N401 Enlarged prostate with lower urinary tract symptoms: Secondary | ICD-10-CM | POA: Diagnosis not present

## 2017-12-24 DIAGNOSIS — I10 Essential (primary) hypertension: Secondary | ICD-10-CM | POA: Insufficient documentation

## 2017-12-24 DIAGNOSIS — R972 Elevated prostate specific antigen [PSA]: Secondary | ICD-10-CM | POA: Diagnosis not present

## 2017-12-24 HISTORY — DX: Malignant neoplasm of prostate: C61

## 2017-12-24 MED FILL — ATORVASTATIN 20 MG TABLET: 20 | 30 days supply | Qty: 30 | Fill #1

## 2017-12-24 NOTE — Progress Notes (Signed)
See progress note under physician encounter. 

## 2017-12-24 NOTE — Progress Notes (Signed)
Radiation Oncology         [phone] ________________________________  Initial Outpatient Consultation  Name: Jorge Martin MRN: [id number]  Date: 12/24/2017  DOB: [birth year]  WU:[id number], Carroll Sage, FNP  McKenzie, Candee Furbish, MD   REFERRING PHYSICIAN: Cleon Gustin, MD  DIAGNOSIS: 83 y.o. gentleman with Stage T2a adenocarcinoma of the prostate with Gleason Score of 4+3, and PSA of 16.90    ICD-10-CM   1. Malignant neoplasm of prostate ([place]) C61     HISTORY OF PRESENT ILLNESS: Jorge Martin is a 83 y.o. male with a diagnosis of prostate cancer. He is a well-established urology patient with a long-standing history of BPH with LUTS on Flomax and elevated PSA since 2005, previously followed by Dr. Hessie Diener. He has had two prior prostate biopsies in 2005 and 2010, both negative for carcinoma. His PSA was noted to be further increased to 16.9 in January 2019 by his primary care provider, Molli Barrows, NP.  Accordingly, he was referred for evaluation in urology to Dr. Alyson Ingles on 09/25/2017, DRE at that time revealed a 5 mm nodule in the right mid gland.  His PSA was repeated 11/04/17 and remained elevated at 14.70.  Therefore, het proceeded to transrectal ultrasound with 12 biopsies of the prostate on 11/18/2017.  The prostate volume measured 124 cc.  Out of 12 core biopsies, 2 were positive.  The maximum Gleason score was 4+3, and this was seen in the right mid.  Additionally, there was Gleason 3+4 disease in the right mid lateral.    PSA History: 10/2017: 14.70 09/2017: 16.90 12/2008: 15.30 01/2006: 6.56 02/2004: 8.03  The patient's CT and bone scans from 12/10/2017 showed no evidence of metastatic disease in the abdomen, pelvis or bony skeleton.  The patient reviewed the biopsy results with his urologist and he has kindly been referred today for discussion of potential radiation treatment options.   PREVIOUS RADIATION THERAPY: No  PAST MEDICAL HISTORY:  Past  Medical History:  Diagnosis Date  . Gout   . Headache   . Hypercholesteremia   . Hypertension   . Prostate cancer ([place])       PAST SURGICAL HISTORY: Past Surgical History:  Procedure Laterality Date  . PROSTATE BIOPSY      FAMILY HISTORY:  Family History  Problem Relation Age of Onset  . Hyperlipidemia Mother   . Hyperlipidemia Father   . Cancer Maternal Aunt        stomach    SOCIAL HISTORY:  Social History   Socioeconomic History  . Marital status: Widowed    Spouse name: Not on file  . Number of children: 8  . Years of education: 6th grade  . Highest education level: Not on file  Occupational History  . Not on file  Social Needs  . Financial resource strain: Not on file  . Food insecurity:    Worry: Not on file    Inability: Not on file  . Transportation needs:    Medical: Not on file    Non-medical: Not on file  Tobacco Use  . Smoking status: Never Smoker  . Smokeless tobacco: Never Used  Substance and Sexual Activity  . Alcohol use: No  . Drug use: No  . Sexual activity: Yes  Lifestyle  . Physical activity:    Days per week: Not on file    Minutes per session: Not on file  . Stress: Not on file  Relationships  . Social connections:    Talks on  phone: Not on file    Gets together: Not on file    Attends religious service: Not on file    Active member of club or organization: Not on file    Attends meetings of clubs or organizations: Not on file    Relationship status: Not on file  . Intimate partner violence:    Fear of current or ex partner: Not on file    Emotionally abused: Not on file    Physically abused: Not on file    Forced sexual activity: Not on file  Other Topics Concern  . Not on file  Social History Narrative   He lives at home alone   Drinks 2 cups of coffee daily    Right handed    ALLERGIES: Patient has no known allergies.  MEDICATIONS:  Current Outpatient Medications  Medication Sig Dispense Refill  . alfuzosin  (UROXATRAL) 10 MG 24 hr tablet Take 10 mg by mouth 2 (two) times daily.  3  . amLODipine (NORVASC) 5 MG tablet TAKE 2 TABLETS BY MOUTH DAILY. 90 tablet 1  . atorvastatin (LIPITOR) 20 MG tablet TAKE 1 TABLET BY MOUTH DAILY. 90 tablet 1  . cetirizine (ZYRTEC) 10 MG tablet Take 10 mg by mouth at bedtime.    . diphenhydrAMINE (BENADRYL) 25 MG tablet Take 25 mg by mouth every 6 (six) hours as needed.    [name] esomeprazole (NEXIUM) 40 MG capsule TAKE 1 CAPSULE BY MOUTH DAILY BEFORE BREAKFAST. 90 capsule 1  . triamcinolone (NASACORT) 55 MCG/ACT AERO nasal inhaler Place 2 sprays into the nose daily. 1 Inhaler 12   No current facility-administered medications for this encounter.     REVIEW OF SYSTEMS:  On review of systems, the patient reports that he is doing well overall. He denies any chest pain, shortness of breath, cough, fevers, chills, night sweats, or unintended weight changes. He denies any bowel disturbances, and denies abdominal pain, nausea or vomiting. He denies any new musculoskeletal or joint aches or pains. His IPSS was 28, indicating severe urinary symptoms with difficulty emptying his bladder completely, urgency, frequency, weak stream, and mild dysuria x2 years. He denies any hematuria, leakage or incontinence. He is able to complete sexual activity almost never. A complete review of systems is obtained and is otherwise negative.    PHYSICAL EXAM:  Wt Readings from Last 3 Encounters:  12/24/17 188 lb 3.2 oz (85.4 kg)  11/21/17 189 lb (85.7 kg)  10/15/17 192 lb (87.1 kg)   Temp Readings from Last 3 Encounters:  12/24/17 98.1 F (36.7 C) (Oral)  11/21/17 98.9 F (37.2 C) (Oral)  08/11/17 98.9 F (37.2 C) (Oral)   BP Readings from Last 3 Encounters:  11/21/17 110/86  10/15/17 (!) 144/76  09/02/17 127/76   Pulse Readings from Last 3 Encounters:  12/24/17 67  11/21/17 80  10/15/17 72   Pain Assessment Pain Score: 0-No pain/10  In general this is a well appearing  African-American male in no acute distress. He is alert and oriented x4 and appropriate throughout the examination. HEENT reveals that the patient is normocephalic, atraumatic. EOMs are intact. PERRLA. Skin is intact without any evidence of gross lesions. Cardiovascular exam reveals a regular rate and rhythm, no clicks rubs or murmurs are auscultated. Chest is clear to auscultation bilaterally. Lymphatic assessment is performed and does not reveal any adenopathy in the cervical, supraclavicular, axillary, or inguinal chains. Abdomen has active bowel sounds in all quadrants and is intact. The abdomen is soft, non tender,  non distended. Lower extremities are negative for pretibial pitting edema, deep calf tenderness, cyanosis or clubbing.   KPS = 100  100 - Normal; no complaints; no evidence of disease. 90   - Able to carry on normal activity; minor signs or symptoms of disease. 80   - Normal activity with effort; some signs or symptoms of disease. 36   - Cares for self; unable to carry on normal activity or to do active work. 60   - Requires occasional assistance, but is able to care for most of his personal needs. 50   - Requires considerable assistance and frequent medical care. 13   - Disabled; requires special care and assistance. 22   - Severely disabled; hospital admission is indicated although death not imminent. 44   - Very sick; hospital admission necessary; active supportive treatment necessary. 10   - Moribund; fatal processes progressing rapidly. 0     - Dead  Karnofsky DA, Abelmann Kapolei, Craver LS and Burchenal JH [phone]) The use of the nitrogen mustards in the palliative treatment of carcinoma: with particular reference to bronchogenic carcinoma Cancer 1 [phone]  LABORATORY DATA:  Lab Results  Component Value Date   WBC 4.1 01/30/2017   HGB 13.4 01/30/2017   HCT 42.3 01/30/2017   MCV 89.6 01/30/2017   PLT 266 01/30/2017   Lab Results  Component Value Date   NA 143 09/02/2017    K 4.2 09/02/2017   CL 110 (H) 09/02/2017   CO2 19 (L) 09/02/2017   Lab Results  Component Value Date   ALT 12 08/11/2017   AST 13 08/11/2017   ALKPHOS 103 01/30/2017   BILITOT 0.6 08/11/2017     RADIOGRAPHY: Nm Bone Scan Whole Body  Result Date: 12/11/2017 CLINICAL DATA:  Prostate cancer. EXAM: NUCLEAR MEDICINE WHOLE BODY BONE SCAN TECHNIQUE: Whole body anterior and posterior images were obtained approximately 3 hours after intravenous injection of radiopharmaceutical. RADIOPHARMACEUTICALS:  20.6 mCi Technetium-51m MDP IV COMPARISON:  None. FINDINGS: No focal bone uptake to suggest osseous metastatic disease. Soft tissue activity unremarkable. IMPRESSION: No evidence of osseous metastatic disease. Electronically Signed   By: Rolm Baptise M.D.   On: 12/11/2017 07:33      IMPRESSION/PLAN: 1. 83 y.o. gentleman with Stage T2a adenocarcinoma of the prostate with Gleason Score of 4+3, and PSA of 16.90. We discussed the patient's workup and outlined the nature of prostate cancer in this setting. The patient's T stage, Gleason's score, and PSA put him into the intermediate risk group. Accordingly, he is eligible for 8 weeks of external radiation. He is not an ideal candidate for prostate brachytherapy given his large prostate volume and not a surgical candidate based on his age. We discussed the available radiation techniques, and focused on the details and logistics and delivery. We discussed and outlined the risks, benefits, short and long-term effects associated with radiotherapy and compared and contrasted these with prostatectomy. We discussed the role of SpaceOAR in reducing the rectal toxicity associated with radiotherapy. We also detailed the role of ADT in the treatment of intermediate prostate cancer and outlined the associated side effects that could be expected with this therapy.  At this point, we do not feel that there is strong supporting evidence that the addition of ADT in his case would  provide significant survival benefit but would almost certainly induce negative side effects.  At the end of the conversation, the patient elects to proceed with 8 weeks of external beam radiation without the use of  ADT. He has not had placement of gold fiducial markers. We will share our discussion with Dr. Alyson Ingles and move forward with scheduling placement of 3 gold fiducial markers and SpaceOAR in the outpatient surgical setting prior to [place] in preparation to proceed with IMRT in the near future.  We spent more than 50% of today's time face to face with the patient in counseling and/or coordination of care.   Nicholos Johns, PA-C    Tyler Pita, MD  [facility] Direct Dial: [phone]  Fax: [phone] [email or web address]  Skype  [email or web address]   This document serves as a record of services personally performed by Tyler Pita, MD and Freeman Caldron, PA-C. It was created on their behalf by Rae Lips, a trained medical scribe. The creation of this record is based on the scribe's personal observations and the providers' statements to them. This document has been checked and approved by the attending providers.

## 2017-12-29 ENCOUNTER — Ambulatory Visit (INDEPENDENT_AMBULATORY_CARE_PROVIDER_SITE_OTHER): Payer: Medicare Other | Admitting: Neurology

## 2017-12-29 DIAGNOSIS — E669 Obesity, unspecified: Secondary | ICD-10-CM

## 2017-12-29 DIAGNOSIS — G4719 Other hypersomnia: Secondary | ICD-10-CM

## 2017-12-29 DIAGNOSIS — G4733 Obstructive sleep apnea (adult) (pediatric): Secondary | ICD-10-CM

## 2017-12-29 DIAGNOSIS — R51 Headache: Secondary | ICD-10-CM

## 2017-12-29 DIAGNOSIS — R519 Headache, unspecified: Secondary | ICD-10-CM

## 2017-12-29 DIAGNOSIS — G4720 Circadian rhythm sleep disorder, unspecified type: Secondary | ICD-10-CM

## 2017-12-30 ENCOUNTER — Telehealth: Payer: Self-pay | Admitting: Medical Oncology

## 2017-12-30 NOTE — Telephone Encounter (Signed)
Attempted to reach Mr. Martucci to follow up post radiation consult 12/24/17. There is no answering machine or voicemail. Will try later.

## 2018-01-01 ENCOUNTER — Ambulatory Visit: Payer: Medicare Other | Admitting: Adult Health

## 2018-01-01 ENCOUNTER — Telehealth: Payer: Self-pay

## 2018-01-01 NOTE — Progress Notes (Deleted)
No flowsheet data found.

## 2018-01-01 NOTE — Telephone Encounter (Signed)
I called pt, per Jinny Blossom, NP, to give him the option of postponing his follow up appt with Jinny Blossom, NP until the cpap titration results are in. Pt may certainly still come in if he has something else to discus with Jinny Blossom, NP. Pt would like to postpone his appt. I explained to him that I will call him when the cpap titration results come in, I will call him to discuss them along with a follow up appt. Pt is agreeable to this. Pt's appt for today has been cancelled.

## 2018-01-05 MED FILL — AMLODIPINE BESYLATE 5 MG TA: 5 | 30 days supply | Qty: 60 | Fill #1

## 2018-01-06 ENCOUNTER — Telehealth: Payer: Self-pay

## 2018-01-06 NOTE — Addendum Note (Signed)
Addended by: Star Age on: 01/06/2018 07:48 AM   Modules accepted: Orders

## 2018-01-06 NOTE — Telephone Encounter (Signed)
I called pt. I advised pt that Dr. Rexene Alberts reviewed their sleep study results and found that pt pt did well with the cpap during his latest sleep study. Dr. Rexene Alberts recommends that pt start a cpap at home. I reviewed PAP compliance expectations with the pt. Pt is agreeable to starting a CPAP. I advised pt that an order will be sent to a DME, [facility], and [facility] will call the pt within about one week after they file with the pt's insurance. [facility] will show the pt how to use the machine, fit for masks, and troubleshoot the CPAP if needed. A follow up appt was made for insurance purposes with Dr. Rexene Alberts on 04/01/18 at 10:30am. Pt verbalized understanding to arrive 15 minutes early and bring their CPAP. A letter with all of this information in it will be mailed to the pt as a reminder. I verified with the pt that the address we have on file is correct. Pt verbalized understanding of results. Pt had no questions at this time but was encouraged to call back if questions arise.

## 2018-01-06 NOTE — Procedures (Signed)
PATIENT'S NAME:  Jorge Martin, Jorge Martin DOB:      [birth year]      MR#:    [id number]     DATE OF RECORDING: 12/29/2017 REFERRING M.D.:  Molli Barrows FNP Study Performed:   CPAP  Titration HISTORY: 83 year old man with a history of hypertension, hyperlipidemia, glaucoma, prediabetes, reflux disease recurrent headaches and borderline obesity, who returns for a full night CPAP titration.  His baseline PSG performed on 11/25/17 showed an AHI of 28.4 with low spo2 of 76%. The patient endorsed the Epworth Sleepiness Scale at 13 points. The patient's weight 192 pounds with a height of 66 (inches), resulting in a BMI of 30.8 kg/m2. The patient's neck circumference measured 16 inches.  CURRENT MEDICATIONS: Amlodipine, Atorvastatin, Cetirizine, Esomeprazole, Tamsulosin and Triamcinolone.  PROCEDURE:  This is a multichannel digital polysomnogram utilizing the SomnoStar 11.2 system.  Electrodes and sensors were applied and monitored per AASM Specifications.   EEG, EOG, Chin and Limb EMG, were sampled at 200 Hz.  ECG, Snore and Nasal Pressure, Thermal Airflow, Respiratory Effort, CPAP Flow and Pressure, Oximetry was sampled at 50 Hz. Digital video and audio were recorded.      The patient was fitted with a MW nasal cradle interface from [employer] Human resources officer). CPAP was initiated at 5 cmH20 with heated humidity per AASM standards and pressure was advanced to 10 cmH20 because of hypopneas, apneas and desaturations.  At a PAP pressure of 10 cmH20, there was a reduction of the AHI to 0.3/hour with brief supine REM sleep achieved and O2 nadir of 91%.   Lights Out was at 21:00 and Lights On at 05:03. Total recording time (TRT) was 483 minutes, with a total sleep time (TST) of 337 minutes. The patient's sleep latency was 32.5 minutes, REM latency was 25.5 minutes.  The sleep efficiency was 69.8 %, which is reduced.     SLEEP ARCHITECTURE: WASO (Wake after sleep onset) was 90.5 minutes with mild to moderate sleep  fragmentation noted. There were 23 minutes in Stage N1, 94.5 minutes Stage N2, 109 minutes Stage N3 and 110.5 minutes in Stage REM.  The percentage of Stage N1 was 6.8%, Stage N2 was 28.%, Stage N3 was 32.3%, which is increased and Stage R (REM sleep) was 32.8%, which is increased and in keeping with rebound. The arousals were noted as: 37 were spontaneous, 0 were associated with PLMs, 2 were associated with respiratory events.  Audio and video analysis did not show any abnormal or unusual movements, behaviors, phonations or vocalizations. The patient took 2 bathroom breaks. The EKG was in keeping with normal sinus rhythm (NSR).  RESPIRATORY ANALYSIS:  There was a total of 2 respiratory events: 0 obstructive apneas, 0 central apneas and 0 mixed apneas with a total of 0 apneas and an apnea index (AI) of 0 /hour. There were 2 hypopneas with a hypopnea index of .4/hour. The patient also had 1 respiratory event related arousals (RERAs).      The total APNEA/HYPOPNEA INDEX  (AHI) was .4 /hour and the total RESPIRATORY DISTURBANCE INDEX was .5 .hour  1 events occurred in REM sleep and 1 events in NREM. The REM AHI was .5 /hour versus a non-REM AHI of .3 /hour.  The patient spent 98 minutes of total sleep time in the supine position and 239 minutes in non-supine. The supine AHI was 0.6, versus a non-supine AHI of 0.3.  OXYGEN SATURATION & C02:  The baseline 02 saturation was 94%, with the lowest being 82%. Time spent below  89% saturation equaled 0 minutes.  PERIODIC LIMB MOVEMENTS: The patient had a total of 0 Periodic Limb Movements. The Periodic Limb Movement (PLM) index was 0 and the PLM Arousal index was 0 /hour.  Post-study, the patient indicated that sleep was better than usual.   IMPRESSION:   1. Obstructive Sleep Apnea (OSA) 2. Dysfunctions associated with sleep stages or arousal from sleep   RECOMMENDATIONS:   1. This study demonstrates resolution of the patient's obstructive sleep apnea with  CPAP therapy. I will, therefore, start the patient on home CPAP treatment at a pressure of 10 cm via MW nasal cradle interface with heated humidity. The patient should be reminded to be fully compliant with PAP therapy to improve sleep related symptoms and decrease long term cardiovascular risks. The patient should be reminded, that it may take up to 3 months to get fully used to using PAP with all planned sleep. The earlier full compliance is achieved, the better long term compliance tends to be. Please note that untreated obstructive sleep apnea carries additional perioperative morbidity. Patients with significant obstructive sleep apnea should receive perioperative PAP therapy and the surgeons and particularly the anesthesiologist should be informed of the diagnosis and the severity of the sleep disordered breathing. 2. This study shows sleep fragmentation and abnormal sleep stage percentages; these are nonspecific findings and per se do not signify an intrinsic sleep disorder or a cause for the patient's sleep-related symptoms. Causes include (but are not limited to) the first night effect of the sleep study, circadian rhythm disturbances, medication effect or an underlying mood disorder or medical problem.  3. The patient should be cautioned not to drive, work at heights, or operate dangerous or heavy equipment when tired or sleepy. Review and reiteration of good sleep hygiene measures should be pursued with any patient. 4. The patient will be seen in follow-up by Dr. Rexene Alberts at [facility] for discussion of the test results and further management strategies. The referring provider will be notified of the test results.   I certify that I have reviewed the entire raw data recording prior to the issuance of this report in accordance with the Standards of Accreditation of the American Academy of Sleep Medicine (AASM)     Star Age, MD, PhD Diplomat, American Board of Psychiatry and Neurology (Neurology and Sleep  Medicine)

## 2018-01-06 NOTE — Telephone Encounter (Signed)
-----   Message from Star Age, MD sent at 01/06/2018  7:48 AM EDT ----- Patient referred by Dr. Jaynee Eagles, seen by me on 10/15/17, diagnostic PSG on 11/25/17. Patient had a CPAP titration study on 12/29/17.  Please call and inform patient that I have entered an order for treatment with positive airway pressure (PAP) treatment for obstructive sleep apnea (OSA). He did well during the latest sleep study with CPAP. We will, therefore, arrange for a machine for home use through a DME (durable medical equipment) company of His choice; and I will see the patient back in follow-up in about 10 weeks. Please also explain to the patient that I will be looking out for compliance data, which can be downloaded from the machine (stored on an SD card, that is inserted in the machine) or via remote access through a modem, that is built into the machine. At the time of the followup appointment we will discuss sleep study results and how it is going with PAP treatment at home. Please advise patient to bring His machine at the time of the first FU visit, even though this is cumbersome. Bringing the machine for every visit after that will likely not be needed, but often helps for the first visit to troubleshoot if needed. Please re-enforce the importance of compliance with treatment and the need for [place] to monitor compliance data - often an insurance requirement and actually good feedback for the patient as far as how they are doing.  Also remind patient, that any interim PAP machine or mask issues should be first addressed with the DME company, as they can often help better with technical and mask fit issues. Please ask if patient has a preference regarding DME company.  Please also make sure, the patient has a follow-up appointment with me in about 10 weeks from the setup date, thanks. May see one of our nurse practitioners if needed for proper timing of the FU appointment.  Please fax or rout report to the referring provider. Thanks,    Star Age, MD, PhD [facility])

## 2018-01-06 NOTE — Progress Notes (Signed)
cp

## 2018-01-06 NOTE — Progress Notes (Signed)
Patient referred by Dr. Jaynee Eagles, seen by me on 10/15/17, diagnostic PSG on 11/25/17. Patient had a CPAP titration study on 12/29/17.  Please call and inform patient that I have entered an order for treatment with positive airway pressure (PAP) treatment for obstructive sleep apnea (OSA). He did well during the latest sleep study with CPAP. We will, therefore, arrange for a machine for home use through a DME (durable medical equipment) company of His choice; and I will see the patient back in follow-up in about 10 weeks. Please also explain to the patient that I will be looking out for compliance data, which can be downloaded from the machine (stored on an SD card, that is inserted in the machine) or via remote access through a modem, that is built into the machine. At the time of the followup appointment we will discuss sleep study results and how it is going with PAP treatment at home. Please advise patient to bring His machine at the time of the first FU visit, even though this is cumbersome. Bringing the machine for every visit after that will likely not be needed, but often helps for the first visit to troubleshoot if needed. Please re-enforce the importance of compliance with treatment and the need for [place] to monitor compliance data - often an insurance requirement and actually good feedback for the patient as far as how they are doing.  Also remind patient, that any interim PAP machine or mask issues should be first addressed with the DME company, as they can often help better with technical and mask fit issues. Please ask if patient has a preference regarding DME company.  Please also make sure, the patient has a follow-up appointment with me in about 10 weeks from the setup date, thanks. May see one of our nurse practitioners if needed for proper timing of the FU appointment.  Please fax or rout report to the referring provider. Thanks,   Star Age, MD, PhD [facility])

## 2018-01-12 ENCOUNTER — Telehealth: Payer: Self-pay | Admitting: Medical Oncology

## 2018-01-12 NOTE — Telephone Encounter (Signed)
Spoke with Jorge Martin to introduce myself as the prostate nurse navigator and my role. I was unable to meet him the day he consulted with Dr. Tammi Klippel. He states he has elected for 8 week so radiation treatments for his prostate cancer. He is scheduled to see Dr. Alyson Ingles 01/22/18 to discuss fiducial markers and SpaceOAR. I asked him to call with questions or concerns and look forward to meeting him when he begins radiation.

## 2018-01-20 MED FILL — ALFUZOSIN HCL ER 10 MG TAB: 10 | 30 days supply | Qty: 60 | Fill #2

## 2018-01-22 DIAGNOSIS — C61 Malignant neoplasm of prostate: Secondary | ICD-10-CM | POA: Diagnosis not present

## 2018-01-22 MED FILL — !VIAGRA 100 MG TABLET: 100 MG | 28 days supply | Qty: 4 | Fill #0

## 2018-01-29 ENCOUNTER — Other Ambulatory Visit: Payer: Self-pay | Admitting: Urology

## 2018-02-05 DIAGNOSIS — G4733 Obstructive sleep apnea (adult) (pediatric): Secondary | ICD-10-CM | POA: Diagnosis not present

## 2018-02-06 MED FILL — AMLODIPINE BESYLATE 5 MG TA: 5 | 30 days supply | Qty: 60 | Fill #2

## 2018-02-06 MED FILL — ATORVASTATIN 20 MG TABLET: 20 | 30 days supply | Qty: 30 | Fill #2

## 2018-02-11 ENCOUNTER — Other Ambulatory Visit: Payer: Self-pay | Admitting: Urology

## 2018-02-11 DIAGNOSIS — C61 Malignant neoplasm of prostate: Secondary | ICD-10-CM

## 2018-02-13 DIAGNOSIS — H25813 Combined forms of age-related cataract, bilateral: Secondary | ICD-10-CM | POA: Diagnosis not present

## 2018-02-13 DIAGNOSIS — H401132 Primary open-angle glaucoma, bilateral, moderate stage: Secondary | ICD-10-CM | POA: Diagnosis not present

## 2018-02-13 DIAGNOSIS — H02054 Trichiasis without entropian left upper eyelid: Secondary | ICD-10-CM | POA: Diagnosis not present

## 2018-02-13 DIAGNOSIS — H02034 Senile entropion of left upper eyelid: Secondary | ICD-10-CM | POA: Diagnosis not present

## 2018-02-17 ENCOUNTER — Encounter (HOSPITAL_BASED_OUTPATIENT_CLINIC_OR_DEPARTMENT_OTHER): Payer: Self-pay | Admitting: *Deleted

## 2018-02-17 ENCOUNTER — Other Ambulatory Visit: Payer: Self-pay

## 2018-02-17 NOTE — Progress Notes (Signed)
Spoke w/ pt via phone for pre-op interview.  Npo after mn w/ exception clear liquid diet until 1000 (no cream/milk products).  Arrive at 1400.  Needs istat and ekg.  Will take uroxatral and nexium am dos w/ sips of water.

## 2018-02-20 MED FILL — ALFUZOSIN HCL ER 10 MG TAB: 10 | 30 days supply | Qty: 60 | Fill #3

## 2018-02-20 MED FILL — LATANOPROST 0.005% EYE DRP: 0.005 | 18 days supply | Qty: 3 | Fill #0

## 2018-02-23 ENCOUNTER — Encounter (HOSPITAL_BASED_OUTPATIENT_CLINIC_OR_DEPARTMENT_OTHER): Payer: Self-pay

## 2018-02-23 ENCOUNTER — Encounter (HOSPITAL_BASED_OUTPATIENT_CLINIC_OR_DEPARTMENT_OTHER)
Admission: RE | Disposition: A | Discharge status: Home / Self Care | Payer: Self-pay | Source: Ambulatory Visit | Attending: Urology

## 2018-02-23 ENCOUNTER — Ambulatory Visit (HOSPITAL_BASED_OUTPATIENT_CLINIC_OR_DEPARTMENT_OTHER)
Admission: RE | Admit: 2018-02-23 | Discharge: 2018-02-23 | Disposition: A | Discharge status: Home / Self Care | Payer: Medicare Other | Source: Ambulatory Visit | Attending: Urology | Admitting: Urology

## 2018-02-23 ENCOUNTER — Ambulatory Visit (HOSPITAL_BASED_OUTPATIENT_CLINIC_OR_DEPARTMENT_OTHER): Payer: Medicare Other | Admitting: Anesthesiology

## 2018-02-23 DIAGNOSIS — Z79899 Other long term (current) drug therapy: Secondary | ICD-10-CM | POA: Diagnosis not present

## 2018-02-23 DIAGNOSIS — C61 Malignant neoplasm of prostate: Secondary | ICD-10-CM | POA: Insufficient documentation

## 2018-02-23 DIAGNOSIS — I10 Essential (primary) hypertension: Secondary | ICD-10-CM | POA: Diagnosis not present

## 2018-02-23 DIAGNOSIS — K219 Gastro-esophageal reflux disease without esophagitis: Secondary | ICD-10-CM | POA: Insufficient documentation

## 2018-02-23 DIAGNOSIS — N529 Male erectile dysfunction, unspecified: Secondary | ICD-10-CM | POA: Diagnosis not present

## 2018-02-23 DIAGNOSIS — R3912 Poor urinary stream: Secondary | ICD-10-CM | POA: Insufficient documentation

## 2018-02-23 DIAGNOSIS — N401 Enlarged prostate with lower urinary tract symptoms: Secondary | ICD-10-CM | POA: Insufficient documentation

## 2018-02-23 DIAGNOSIS — E7800 Pure hypercholesterolemia, unspecified: Secondary | ICD-10-CM | POA: Diagnosis not present

## 2018-02-23 DIAGNOSIS — Z8546 Personal history of malignant neoplasm of prostate: Secondary | ICD-10-CM

## 2018-02-23 DIAGNOSIS — G4733 Obstructive sleep apnea (adult) (pediatric): Secondary | ICD-10-CM | POA: Insufficient documentation

## 2018-02-23 HISTORY — DX: Gastro-esophageal reflux disease without esophagitis: K21.9

## 2018-02-23 HISTORY — DX: Obstructive sleep apnea (adult) (pediatric): G47.33

## 2018-02-23 HISTORY — DX: Alcohol dependence, in remission: F10.21

## 2018-02-23 HISTORY — DX: Dependence on other enabling machines and devices: Z99.89

## 2018-02-23 HISTORY — DX: Other seasonal allergic rhinitis: J30.2

## 2018-02-23 HISTORY — DX: Benign prostatic hyperplasia with lower urinary tract symptoms: N40.1

## 2018-02-23 HISTORY — DX: Male erectile dysfunction, unspecified: N52.9

## 2018-02-23 HISTORY — DX: Poor urinary stream: R39.12

## 2018-02-23 HISTORY — PX: GOLD SEED IMPLANT: SHX6343

## 2018-02-23 HISTORY — DX: Prediabetes: R73.03

## 2018-02-23 HISTORY — DX: Unspecified osteoarthritis, unspecified site: M19.90

## 2018-02-23 HISTORY — DX: Personal history of other diseases of the musculoskeletal system and connective tissue: Z87.39

## 2018-02-23 HISTORY — PX: SPACE OAR INSTILLATION: SHX6769

## 2018-02-23 LAB — POCT I-STAT 4, (NA,K, GLUC, HGB,HCT)
Glucose, Bld: 103 mg/dL — ABNORMAL HIGH (ref 65–99)
HEMATOCRIT: 39 % (ref 39.0–52.0)
Hemoglobin: 13.3 g/dL (ref 13.0–17.0)
Potassium: 3.9 mmol/L (ref 3.5–5.1)
SODIUM: 141 mmol/L (ref 135–145)

## 2018-02-23 SURGERY — INSERTION, GOLD SEEDS
Anesthesia: Monitor Anesthesia Care

## 2018-02-23 MED ORDER — LIDOCAINE 2% (20 MG/ML) 5 ML SYRINGE
INTRAMUSCULAR | Status: DC | PRN
  Administered 2018-02-23: 25 mg via INTRAVENOUS

## 2018-02-23 MED ORDER — CEFAZOLIN SODIUM-DEXTROSE 2-4 GM/100ML-% IV SOLN
INTRAVENOUS | Status: AC
  Filled 2018-02-23: qty 100

## 2018-02-23 MED ORDER — TRAMADOL HCL 50 MG PO TABS: 50.0000 mg | ORAL_TABLET | Freq: Four times a day (QID) | ORAL | 0 refills | Status: DC | PRN

## 2018-02-23 MED ORDER — OXYCODONE HCL 5 MG PO TABS
5.0000 mg | ORAL_TABLET | Freq: Once | ORAL | Status: DC | PRN
  Filled 2018-02-23: qty 1

## 2018-02-23 MED ORDER — SODIUM CHLORIDE 0.9 % IV SOLN
INTRAVENOUS | Status: DC | PRN
  Administered 2018-02-23: 10 ug/kg/min via INTRAVENOUS

## 2018-02-23 MED ORDER — LACTATED RINGERS IV SOLN
INTRAVENOUS | Status: DC
  Administered 2018-02-23: 15:00:00 via INTRAVENOUS
  Filled 2018-02-23: qty 1000

## 2018-02-23 MED ORDER — KETAMINE HCL 10 MG/ML IJ SOLN
INTRAMUSCULAR | Status: AC
  Filled 2018-02-23: qty 1

## 2018-02-23 MED ORDER — OXYCODONE HCL 5 MG/5ML PO SOLN
5.0000 mg | Freq: Once | ORAL | Status: DC | PRN
  Filled 2018-02-23: qty 5

## 2018-02-23 MED ORDER — LIDOCAINE HCL 1 % IJ SOLN
INTRAMUSCULAR | Status: DC | PRN
  Administered 2018-02-23: 10 mL

## 2018-02-23 MED ORDER — PROPOFOL 500 MG/50ML IV EMUL
INTRAVENOUS | Status: DC | PRN
  Administered 2018-02-23: 200 ug/kg/min via INTRAVENOUS

## 2018-02-23 MED ORDER — HYDROMORPHONE HCL 1 MG/ML IJ SOLN
INTRAMUSCULAR | Status: AC
  Filled 2018-02-23: qty 1

## 2018-02-23 MED ORDER — PROMETHAZINE HCL 25 MG/ML IJ SOLN
6.2500 mg | INTRAMUSCULAR | Status: DC | PRN
  Filled 2018-02-23: qty 1

## 2018-02-23 MED ORDER — SODIUM CHLORIDE FLUSH 0.9 % IV SOLN
INTRAVENOUS | Status: DC | PRN
  Administered 2018-02-23: 10 mL

## 2018-02-23 MED ORDER — CEFAZOLIN SODIUM-DEXTROSE 2-4 GM/100ML-% IV SOLN
2.0000 g | INTRAVENOUS | Status: AC
  Administered 2018-02-23: 2 g via INTRAVENOUS
  Filled 2018-02-23: qty 100

## 2018-02-23 MED ORDER — HYDROMORPHONE HCL 1 MG/ML IJ SOLN
0.2500 mg | INTRAMUSCULAR | Status: DC | PRN
  Administered 2018-02-23 (×2): 0.25 mg via INTRAVENOUS
  Filled 2018-02-23: qty 0.5

## 2018-02-23 MED ORDER — PROPOFOL 500 MG/50ML IV EMUL
INTRAVENOUS | Status: AC
Start: 2018-02-23 — End: ?
  Filled 2018-02-23: qty 50

## 2018-02-23 MED FILL — traMADol HCL 50 MG TABS: 50 | 5 days supply | Qty: 15 | Fill #0

## 2018-02-23 MED FILL — !VIAGRA 100 MG TABLET: 100 MG | 28 days supply | Qty: 4 | Fill #1

## 2018-02-23 SURGICAL SUPPLY — 14 items
DRSG TEGADERM 4X4.75 (GAUZE/BANDAGES/DRESSINGS) ×3 IMPLANT
DRSG TEGADERM 8X12 (GAUZE/BANDAGES/DRESSINGS) ×3 IMPLANT
GAUZE SPONGE 4X4 8PLY STR LF (GAUZE/BANDAGES/DRESSINGS) ×3 IMPLANT
GLOVE BIO SURGEON STRL SZ8 (GLOVE) ×3 IMPLANT
GLOVE ECLIPSE 8.0 STRL XLNG CF (GLOVE) ×3 IMPLANT
GOWN STRL REUS W/TWL XL LVL3 (GOWN DISPOSABLE) ×3 IMPLANT
IMPL SPACEOAR SYSTEM 10ML (MISCELLANEOUS) ×1 IMPLANT
IMPLANT SPACEOAR SYSTEM 10ML (MISCELLANEOUS) ×3
KIT TURNOVER CYSTO (KITS) ×3 IMPLANT
MARKER GOLD PRELOAD 1.2X3 (Urological Implant) ×1 IMPLANT
PACK CYSTO (CUSTOM PROCEDURE TRAY) ×3 IMPLANT
SEED GOLD PRELOAD 1.2X3 (Urological Implant) ×3 IMPLANT
SURGILUBE 2OZ TUBE FLIPTOP (MISCELLANEOUS) ×3 IMPLANT
UNDERPAD 30X30 (UNDERPADS AND DIAPERS) ×6 IMPLANT

## 2018-02-23 NOTE — Discharge Instructions (Signed)
Transrectal Ultrasound-Guided Prostate Gold Seed Placement Transrectal ultrasound-guided prostate gold seed placement is a procedure to place small metal seeds (fiducial markers)in or around a tumor on the prostate. These seeds help show exactly where a tumor is located. This helps guide radiation therapy directly at the prostate tumor, which avoids killing nearby healthy tissue. During this procedure, a small device (probe) is lubricated and placed inside the rectum. The probe makes sound waves that create a picture of the prostate (transrectal ultrasound). The images will be used to help guide the placement of the gold seeds. This procedure is also called fiducial marker placement. Tell a health care provider about:  Any allergies you have.  All medicines you are taking, including vitamins, herbs, eye drops, creams, and over-the-counter medicines.  Any problems you or family members have had with anesthetic medicines.  Any medical conditions you have.  Any blood disorders you have.  Any surgeries you have had. What are the risks? Generally, this is a safe procedure. However, problems may occur, including:  Infection.  Bleeding.  Allergic reactions to medicines or dyes.  Damage to other structures or organs.  The gold seeds moving to another part of the body. This is rare.  What happens before the procedure? Medicines  Ask your health care provider about: ? Changing or stopping your regular medicines. This is especially important if you are taking diabetes medicines or blood thinners. ? Taking over-the-counter medicines, vitamins, herbs, and supplements. ? Taking medicines such as aspirin and ibuprofen. These medicines can thin your blood. Do not take these medicines unless your health care provider tells you to take them.  You may be given antibiotic medicine to help prevent an infection. Staying hydrated Follow instructions from your health care provider about hydration,  which may include:  Up to 2 hours before the procedure - you may continue to drink clear liquids, such as water, clear fruit juice, black coffee, and plain tea.  Eating and drinking restrictions Follow instructions from your health care provider about eating and drinking, which may include:  8 hours before the procedure - stop eating heavy meals or foods such as meat, fried foods, or fatty foods.  6 hours before the procedure - stop eating light meals or foods, such as toast or cereal.  6 hours before the procedure - stop drinking milk or drinks that contain milk.  2 hours before the procedure - stop drinking clear liquids.  General instructions  If you were asked to do a bowel prep before your procedure, follow instructions from your health care provider about how to do this.  You may have a blood sample taken.  Plan to have someone take you home from the hospital or clinic.  Plan to have a responsible adult care for you for at least 24 hours after you leave the hospital or clinic. This is important. What happens during the procedure?  To lower your risk of infection: ? Your health care team will wash or sanitize their hands. ? Hair may be removed from the surgical area. ? Your skin will be washed with soap.  Monitors may be placed on your body to track your heart rate, blood pressure, and breathing.  An IV will be inserted into one of your veins.  You will be given one or more of the following: ? A medicine to help you relax (sedative). ? A medicine to numb the area (local anesthetic). ? A medicine that is injected into an area of your body to  numb everything below the injection site (regional anesthetic).  A lubricated probe will be inserted into your rectum to perform the ultrasound.  You will be placed on your left side with your knees bent up toward your chest.  Using the ultrasound as a guide, the health care provider will insert a needle between your rectum and  scrotum and will place it near the tumor.  The needle will inject the gold seed into the area around the tumor. This will be repeated with two more gold seeds. More seeds may be added depending on the size and location of the tumor.  The needle and probe will be removed. The procedure may vary among health care providers and hospitals. What happens after the procedure?  You may have imaging tests done to check the placement of the gold seeds. This may include a CT scan or ultrasound.  Your blood pressure, heart rate, breathing rate, and blood oxygen level will continue to be monitored until the medicines you were given have worn off.  Do not drive for 24 hours if you were given a sedative.  You will be given medicine to help with pain, if needed. Summary  This procedure involves placing small metal seeds (fiducial markers) in or around a prostate tumor.  The gold seeds help guide radiation therapy directly at the prostate tumor, which avoids killing nearby healthy tissue.  During this procedure, a small probe will be placed in the rectum to take images of the prostate (transrectal ultrasound). The images will help guide the placement of the gold seeds. This information is not intended to replace advice given to you by your health care provider. Make sure you discuss any questions you have with your health care provider. Document Released: 12/17/2016 Document Revised: 12/17/2016 Document Reviewed: 12/17/2016 Elsevier Interactive Patient Education  2018 [facility] Instructions  Activity: Get plenty of rest for the remainder of the day. A responsible individual must stay with you for 24 hours following the procedure.  For the next 24 hours, DO NOT: -Drive a car -Paediatric nurse -Drink alcoholic beverages -Take any medication unless instructed by your physician -Make any legal decisions or sign important papers.  Meals: Start with liquid  foods such as gelatin or soup. Progress to regular foods as tolerated. Avoid greasy, spicy, heavy foods. If nausea and/or vomiting occur, drink only clear liquids until the nausea and/or vomiting subsides. Call your physician if vomiting continues.  Special Instructions/Symptoms: Your throat may feel dry or sore from the anesthesia or the breathing tube placed in your throat during surgery. If this causes discomfort, gargle with warm salt water. The discomfort should disappear within 24 hours.

## 2018-02-23 NOTE — Anesthesia Procedure Notes (Signed)
Procedure Name: MAC Date/Time: 02/23/2018 3:10 PM Performed by: Wanita Chamberlain, CRNA Pre-anesthesia Checklist: Patient identified, Emergency Drugs available, Suction available, Patient being monitored and Timeout performed Patient Re-evaluated:Patient Re-evaluated prior to induction Oxygen Delivery Method: Nasal cannula Induction Type: IV induction

## 2018-02-23 NOTE — Op Note (Signed)
PRE-OPERATIVE DIAGNOSIS:  Adenocarcinoma of the prostate  POST-OPERATIVE DIAGNOSIS:  Same  PROCEDURE:  1. Placement of fiducial markers 2. Cystoscopy 3. Placement of SpaceOAR  SURGEON:  Surgeon(s): Nicolette Bang, MD  Radiation oncologist: Tyler Pita, MD  ANESTHESIA:  General  EBL:  Minimal  DRAINS: none  INDICATION: Jorge Martin is a 83 year old with a history of T1c prostate cancer. After discussing treatment options he has elected to proceed with fiducial markers and SpaceOAR  Description of procedure: After informed consent the patient was brought to the major OR, placed on the table and administered general anesthesia. He was then moved to the modified lithotomy position with his perineum perpendicular to the floor. His perineum and genitalia were then sterilely prepped. An official timeout was then performed. A rectal tube was placed in the rectum and the transrectal ultrasound probe was placed in the rectum and affixed to the stand. He was then sterilely draped. Real-time ultrasonography was then used along with the previously developed plan to place a seed at the left mid base, left mid apex, and right mid lateral. This proceeded without difficulty or complication.  We then proceeded to mix the SpaceOAR using the kit supplied from the manufacturer. Once this was complete we placed a sinal needle into the perirectal fat between the rectum and the prostate. Once this was accomplished we injected 2cc of normal saline to hydrodissect the plain. We then instilled the the SpaceOAR through the spinal needle and noted good distribution in the perirectal fat.  The patient was awakened and taken to recovery room in stable and satisfactory condition. He tolerated procedure well and there were no intraoperative complications.

## 2018-02-23 NOTE — H&P (Signed)
Urology Admission H&P  Chief Complaint: prostate cancer  History of Present Illness: Jorge Martin is a 83yo with a hx of prostate cancer who is scheduled to undergo IMRT. He presents today for fiducial marker placement and SpaceOAR placement  Past Medical History:  Diagnosis Date  . Arthritis   . ED (erectile dysfunction)   . GERD (gastroesophageal reflux disease)   . Headache   . History of alcoholism ([place])    02-17-2018  per pt quit 1999  . History of gout 09/2011   right ankle  . Hypercholesteremia   . Hyperplasia of prostate with lower urinary tract symptoms (LUTS)   . Hypertension   . OSA on CPAP followed by dr Rexene Alberts   study 11-25-2017 moderate to severe osa (AHI 28.4/hr,  REM AHI 38/hr)  . Pre-diabetes   . Prostate cancer [facility]) urologist-  dr Alyson Ingles  onologist-  dr Tammi Klippel   dx 11-18-2017  Stage T2a,  Gleason 4+3,  PSA 16.90,  vol 124cc-- treatment plan external beam radiation  . Seasonal allergic rhinitis   . Weak urinary stream    Past Surgical History:  Procedure Laterality Date  . NO PAST SURGERIES    . PROSTATE BIOPSY  11-18-2017  dr Alyson Ingles  office    Home Medications:  Current Facility-Administered Medications  Medication Dose Route Frequency Provider Last Rate Last Dose  . ceFAZolin (ANCEF) IVPB 2g/100 mL premix  2 g Intravenous 30 min Pre-Op Selina Tapper, Candee Furbish, MD      . lactated ringers infusion   Intravenous Continuous Audry Pili, MD 50 mL/hr at 02/23/18 1431     Allergies: No Known Allergies  Family History  Problem Relation Age of Onset  . Hyperlipidemia Mother   . Hyperlipidemia Father   . Cancer Maternal Aunt        stomach   Social History:  reports that he has never smoked. He has never used smokeless tobacco. He reports that he drank alcohol. He reports that he does not use drugs.  Review of Systems  All other systems reviewed and are negative.   Physical Exam:  Vital signs in last 24 hours: Temp:  [97.9 F (36.6 C)] 97.9 F  (36.6 C) (06/10 1358) Pulse Rate:  [59] 59 (06/10 1358) Resp:  [16] 16 (06/10 1358) BP: (162)/(82) 162/82 (06/10 1358) SpO2:  [98 %] 98 % (06/10 1358) Weight:  [84.2 kg (185 lb 9.6 oz)] 84.2 kg (185 lb 9.6 oz) (06/10 1358) Physical Exam  Constitutional: He is oriented to person, place, and time. He appears well-developed and well-nourished.  HENT:  Head: Normocephalic and atraumatic.  Eyes: Pupils are equal, round, and reactive to light. EOM are normal.  Neck: Normal range of motion. No thyromegaly present.  Cardiovascular: Normal rate and regular rhythm.  Respiratory: Effort normal. No respiratory distress.  GI: Soft. He exhibits no distension.  Musculoskeletal: Normal range of motion. He exhibits no edema.  Neurological: He is alert and oriented to person, place, and time.  Skin: Skin is warm and dry.  Psychiatric: He has a normal mood and affect. His behavior is normal. Thought content normal.    Laboratory Data:  Results for orders placed or performed during the hospital encounter of 02/23/18 (from the past 24 hour(s))  I-STAT 4, (NA,K, GLUC, HGB,HCT)     Status: Abnormal   Collection Time: 02/23/18  2:32 PM  Result Value Ref Range   Sodium 141 135 - 145 mmol/L   Potassium 3.9 3.5 - 5.1 mmol/L  Glucose, Bld 103 (H) 65 - 99 mg/dL   HCT 39.0 39.0 - 52.0 %   Hemoglobin 13.3 13.0 - 17.0 g/dL   No results found for this or any previous visit (from the past 240 hour(s)). Creatinine: No results for input(s): CREATININE in the last 168 hours. Baseline Creatinine: unknwon  Impression/Assessment:  83yo with T1c prostate cancer  Plan:  The risks/benefits/alternatives to fiducial marker placement and SpaceO\AR placement was explained to the patient and he understands and wishes to proceed with surgery  Nicolette Bang 02/23/2018, 2:41 PM

## 2018-02-23 NOTE — Transfer of Care (Signed)
Immediate Anesthesia Transfer of Care Note  Patient: Jorge Martin  Procedure(s) Performed: GOLD SEED IMPLANT (N/A ) SPACE OAR INSTILLATION (N/A )  Patient Location: PACU  Anesthesia Type:MAC  Level of Consciousness: awake, alert , oriented and patient cooperative  Airway & Oxygen Therapy: Patient Spontanous Breathing and Patient connected to nasal cannula oxygen  Post-op Assessment: Report given to RN and Post -op Vital signs reviewed and stable  Post vital signs: Reviewed and stable  Last Vitals:  Vitals Value Taken Time  BP    Temp    Pulse 52 02/23/2018  3:36 PM  Resp    SpO2 100 % 02/23/2018  3:36 PM    Last Pain:  Vitals:   02/23/18 1414  TempSrc:   PainSc: 0-No pain      Patients Stated Pain Goal: 3 (72/53/66 [id number])  Complications: No apparent anesthesia complications

## 2018-02-23 NOTE — Anesthesia Postprocedure Evaluation (Signed)
Anesthesia Post Note  Patient: Jorge Martin  Procedure(s) Performed: GOLD SEED IMPLANT (N/A ) SPACE OAR INSTILLATION (N/A )     Patient location during evaluation: PACU Anesthesia Type: MAC Level of consciousness: awake and alert Pain management: pain level controlled Vital Signs Assessment: post-procedure vital signs reviewed and stable Respiratory status: spontaneous breathing, nonlabored ventilation and respiratory function stable Cardiovascular status: stable and blood pressure returned to baseline Postop Assessment: no apparent nausea or vomiting Anesthetic complications: no    Last Vitals:  Vitals:   02/23/18 1608 02/23/18 1632  BP: (!) 153/79 (!) 150/76  Pulse: (!) 52 (!) 55  Resp: 15 14  Temp:  36.7 C  SpO2: 93% 96%    Last Pain:  Vitals:   02/23/18 1632  TempSrc:   PainSc: 0-No pain                 Lynda Rainwater

## 2018-02-23 NOTE — Anesthesia Preprocedure Evaluation (Addendum)
Anesthesia Evaluation  Patient identified by MRN, date of birth, ID band Patient awake    Reviewed: Allergy & Precautions, NPO status , Patient's Chart, lab work & pertinent test results  Airway Mallampati: II  TM Distance: >3 FB Neck ROM: Full    Dental no notable dental hx. (+) Dental Advisory Given, Teeth Intact,    Pulmonary neg pulmonary ROS, sleep apnea ,    Pulmonary exam normal breath sounds clear to auscultation       Cardiovascular hypertension, negative cardio ROS Normal cardiovascular exam Rhythm:Regular Rate:Normal     Neuro/Psych  Headaches, negative neurological ROS  negative psych ROS   GI/Hepatic negative GI ROS, Neg liver ROS, GERD  ,  Endo/Other  negative endocrine ROS  Renal/GU negative Renal ROS  negative genitourinary   Musculoskeletal negative musculoskeletal ROS (+) Arthritis ,   Abdominal   Peds negative pediatric ROS (+)  Hematology negative hematology ROS (+)   Anesthesia Other Findings Prostate cancer  Reproductive/Obstetrics negative OB ROS                          Anesthesia Physical Anesthesia Plan  ASA: II  Anesthesia Plan: General   Post-op Pain Management:    Induction: Intravenous  PONV Risk Score and Plan: 2 and Ondansetron and Treatment may vary due to age or medical condition  Airway Management Planned: LMA  Additional Equipment:   Intra-op Plan:   Post-operative Plan: Extubation in OR  Informed Consent: I have reviewed the patients History and Physical, chart, labs and discussed the procedure including the risks, benefits and alternatives for the proposed anesthesia with the patient or authorized representative who has indicated his/her understanding and acceptance.   Dental advisory given  Plan Discussed with: CRNA  Anesthesia Plan Comments:         Anesthesia Quick Evaluation

## 2018-02-24 ENCOUNTER — Encounter (HOSPITAL_BASED_OUTPATIENT_CLINIC_OR_DEPARTMENT_OTHER): Payer: Self-pay | Admitting: Urology

## 2018-03-03 ENCOUNTER — Telehealth: Payer: Self-pay | Admitting: *Deleted

## 2018-03-03 NOTE — Telephone Encounter (Signed)
Called patient to ask about coming in on 03-05-18, pt. Daughter- Sharren Bridge , agreed to 8 am appt. On 03-05-18

## 2018-03-05 ENCOUNTER — Encounter: Payer: Self-pay | Admitting: Medical Oncology

## 2018-03-05 ENCOUNTER — Ambulatory Visit
Admission: RE | Admit: 2018-03-05 | Discharge: 2018-03-05 | Disposition: A | Discharge status: Home / Self Care | Payer: Medicare Other | Source: Ambulatory Visit | Attending: Radiation Oncology | Admitting: Radiation Oncology

## 2018-03-05 DIAGNOSIS — R7303 Prediabetes: Secondary | ICD-10-CM | POA: Insufficient documentation

## 2018-03-05 DIAGNOSIS — Z510 Encounter for antineoplastic radiation therapy: Secondary | ICD-10-CM | POA: Diagnosis not present

## 2018-03-05 DIAGNOSIS — I10 Essential (primary) hypertension: Secondary | ICD-10-CM | POA: Insufficient documentation

## 2018-03-05 DIAGNOSIS — Z79899 Other long term (current) drug therapy: Secondary | ICD-10-CM | POA: Diagnosis not present

## 2018-03-05 DIAGNOSIS — C61 Malignant neoplasm of prostate: Secondary | ICD-10-CM | POA: Insufficient documentation

## 2018-03-05 DIAGNOSIS — K219 Gastro-esophageal reflux disease without esophagitis: Secondary | ICD-10-CM | POA: Diagnosis not present

## 2018-03-05 DIAGNOSIS — E7800 Pure hypercholesterolemia, unspecified: Secondary | ICD-10-CM | POA: Insufficient documentation

## 2018-03-05 DIAGNOSIS — G4733 Obstructive sleep apnea (adult) (pediatric): Secondary | ICD-10-CM | POA: Insufficient documentation

## 2018-03-05 NOTE — Progress Notes (Signed)
  Radiation Oncology         [phone] ________________________________  Name: Jorge Martin MRN: [id number]  Date: 03/05/2018  DOB: 1935-09-10  SIMULATION AND TREATMENT PLANNING NOTE    ICD-10-CM   1. Malignant neoplasm of prostate ([place]) C61     DIAGNOSIS:  83 y.o. gentleman with stage T2a adenocarcinoma of the prostate with Gleason score of 4+3, and PSA of 16.90.  NARRATIVE:  The patient was brought to the [facility].  Identity was confirmed.  All relevant records and images related to the planned course of therapy were reviewed.  The patient freely provided informed written consent to proceed with treatment after reviewing the details related to the planned course of therapy. The consent form was witnessed and verified by the simulation staff.  Then, the patient was set-up in a stable reproducible supine position for radiation therapy.  A vacuum lock pillow device was custom fabricated to position his legs in a reproducible immobilized position.  Then, I performed a urethrogram under sterile conditions to identify the prostatic apex.  CT images were obtained.  Surface markings were placed.  The CT images were loaded into the planning software.  Then the prostate target and avoidance structures including the rectum, bladder, bowel and hips were contoured.  Treatment planning then occurred.  The radiation prescription was entered and confirmed.  A total of one complex treatment devices were fabricated. I have requested : Intensity Modulated Radiotherapy (IMRT) is medically necessary for this case for the following reason:  Rectal sparing.[name]  PLAN:  The patient will receive 45 Gy in 25 fractions of 1.8 Gy, followed by a boost to the prostate to a total dose of 75 Gy with 15 additional fractions of 2.0 Gy.  ________________________________  Sheral Apley Tammi Klippel, M.D.  This document serves as a record of services personally performed by Tyler Pita MD. It was created on  his behalf by Delton Coombes, a trained medical scribe. The creation of this record is based on the scribe's personal observations and the provider's statements to them.

## 2018-03-06 ENCOUNTER — Encounter: Payer: Self-pay | Admitting: Neurology

## 2018-03-06 ENCOUNTER — Ambulatory Visit: Payer: Medicare Other | Admitting: Radiation Oncology

## 2018-03-06 ENCOUNTER — Ambulatory Visit (HOSPITAL_COMMUNITY)
Admission: RE | Admit: 2018-03-06 | Discharge: 2018-03-06 | Disposition: A | Discharge status: Home / Self Care | Payer: Medicare Other | Source: Ambulatory Visit | Attending: Urology | Admitting: Urology

## 2018-03-06 DIAGNOSIS — C61 Malignant neoplasm of prostate: Secondary | ICD-10-CM

## 2018-03-08 DIAGNOSIS — G4733 Obstructive sleep apnea (adult) (pediatric): Secondary | ICD-10-CM | POA: Diagnosis not present

## 2018-03-11 MED FILL — !VIAGRA 100 MG TABLET: 100 MG | 28 days supply | Qty: 4 | Fill #2

## 2018-03-12 ENCOUNTER — Encounter (HOSPITAL_BASED_OUTPATIENT_CLINIC_OR_DEPARTMENT_OTHER): Payer: Self-pay | Admitting: Urology

## 2018-03-12 ENCOUNTER — Telehealth: Payer: Self-pay

## 2018-03-12 DIAGNOSIS — G4733 Obstructive sleep apnea (adult) (pediatric): Secondary | ICD-10-CM | POA: Diagnosis not present

## 2018-03-12 DIAGNOSIS — E7800 Pure hypercholesterolemia, unspecified: Secondary | ICD-10-CM | POA: Diagnosis not present

## 2018-03-12 DIAGNOSIS — Z79899 Other long term (current) drug therapy: Secondary | ICD-10-CM | POA: Diagnosis not present

## 2018-03-12 DIAGNOSIS — R7303 Prediabetes: Secondary | ICD-10-CM | POA: Diagnosis not present

## 2018-03-12 DIAGNOSIS — Z510 Encounter for antineoplastic radiation therapy: Secondary | ICD-10-CM | POA: Diagnosis not present

## 2018-03-12 DIAGNOSIS — I10 Essential (primary) hypertension: Secondary | ICD-10-CM | POA: Diagnosis not present

## 2018-03-12 DIAGNOSIS — C61 Malignant neoplasm of prostate: Secondary | ICD-10-CM | POA: Diagnosis not present

## 2018-03-12 DIAGNOSIS — K219 Gastro-esophageal reflux disease without esophagitis: Secondary | ICD-10-CM | POA: Diagnosis not present

## 2018-03-12 MED ORDER — AMLODIPINE BESYLATE 5 MG PO TABS: ORAL_TABLET | ORAL | 1 refills | Status: DC

## 2018-03-12 MED FILL — AMLODIPINE BESYLATE 5 MG TA: 5 | 30 days supply | Qty: 60 | Fill #0

## 2018-03-12 NOTE — Telephone Encounter (Signed)
Medication refill

## 2018-03-16 ENCOUNTER — Encounter: Payer: Self-pay | Admitting: Medical Oncology

## 2018-03-16 ENCOUNTER — Ambulatory Visit
Admission: RE | Admit: 2018-03-16 | Discharge: 2018-03-16 | Disposition: A | Discharge status: Home / Self Care | Payer: Medicare Other | Source: Ambulatory Visit | Attending: Radiation Oncology | Admitting: Radiation Oncology

## 2018-03-16 DIAGNOSIS — C61 Malignant neoplasm of prostate: Secondary | ICD-10-CM | POA: Diagnosis not present

## 2018-03-16 DIAGNOSIS — Z510 Encounter for antineoplastic radiation therapy: Secondary | ICD-10-CM | POA: Insufficient documentation

## 2018-03-17 ENCOUNTER — Ambulatory Visit
Admission: RE | Admit: 2018-03-17 | Discharge: 2018-03-17 | Disposition: A | Discharge status: Home / Self Care | Payer: Medicare Other | Source: Ambulatory Visit | Attending: Radiation Oncology | Admitting: Radiation Oncology

## 2018-03-17 DIAGNOSIS — Z510 Encounter for antineoplastic radiation therapy: Secondary | ICD-10-CM | POA: Diagnosis not present

## 2018-03-17 DIAGNOSIS — C61 Malignant neoplasm of prostate: Secondary | ICD-10-CM | POA: Diagnosis not present

## 2018-03-18 ENCOUNTER — Ambulatory Visit
Admission: RE | Admit: 2018-03-18 | Discharge: 2018-03-18 | Disposition: A | Discharge status: Home / Self Care | Payer: Medicare Other | Source: Ambulatory Visit | Attending: Radiation Oncology | Admitting: Radiation Oncology

## 2018-03-18 DIAGNOSIS — Z510 Encounter for antineoplastic radiation therapy: Secondary | ICD-10-CM | POA: Diagnosis not present

## 2018-03-18 DIAGNOSIS — C61 Malignant neoplasm of prostate: Secondary | ICD-10-CM | POA: Diagnosis not present

## 2018-03-20 ENCOUNTER — Ambulatory Visit
Admission: RE | Admit: 2018-03-20 | Discharge: 2018-03-20 | Disposition: A | Discharge status: Home / Self Care | Payer: Medicare Other | Source: Ambulatory Visit | Attending: Radiation Oncology | Admitting: Radiation Oncology

## 2018-03-20 ENCOUNTER — Other Ambulatory Visit: Payer: Self-pay | Admitting: Radiation Oncology

## 2018-03-20 DIAGNOSIS — C61 Malignant neoplasm of prostate: Secondary | ICD-10-CM | POA: Diagnosis not present

## 2018-03-20 DIAGNOSIS — Z510 Encounter for antineoplastic radiation therapy: Secondary | ICD-10-CM | POA: Diagnosis not present

## 2018-03-20 MED ORDER — DRONABINOL 2.5 MG PO CAPS: 2.5000 mg | ORAL_CAPSULE | Freq: Two times a day (BID) | ORAL | 2 refills | Status: DC

## 2018-03-23 ENCOUNTER — Ambulatory Visit
Admission: RE | Admit: 2018-03-23 | Discharge: 2018-03-23 | Disposition: A | Discharge status: Home / Self Care | Payer: Medicare Other | Source: Ambulatory Visit | Attending: Radiation Oncology | Admitting: Radiation Oncology

## 2018-03-23 ENCOUNTER — Telehealth: Payer: Self-pay | Admitting: Radiation Oncology

## 2018-03-23 NOTE — Telephone Encounter (Signed)
Received voicemail message from a Julio Zappia that didn't provide a dob or contact number. Phoned this former patient to inquire if it was him who called. No answer and no option to leave message because voicemail wasn't set up.

## 2018-03-24 ENCOUNTER — Ambulatory Visit
Admission: RE | Admit: 2018-03-24 | Discharge: 2018-03-24 | Disposition: A | Discharge status: Home / Self Care | Payer: Medicare Other | Source: Ambulatory Visit | Attending: Radiation Oncology | Admitting: Radiation Oncology

## 2018-03-24 DIAGNOSIS — Z510 Encounter for antineoplastic radiation therapy: Secondary | ICD-10-CM | POA: Diagnosis not present

## 2018-03-24 DIAGNOSIS — C61 Malignant neoplasm of prostate: Secondary | ICD-10-CM | POA: Diagnosis not present

## 2018-03-25 ENCOUNTER — Ambulatory Visit
Admission: RE | Admit: 2018-03-25 | Discharge: 2018-03-25 | Disposition: A | Discharge status: Home / Self Care | Payer: Medicare Other | Source: Ambulatory Visit | Attending: Radiation Oncology | Admitting: Radiation Oncology

## 2018-03-25 DIAGNOSIS — C61 Malignant neoplasm of prostate: Secondary | ICD-10-CM | POA: Diagnosis not present

## 2018-03-25 DIAGNOSIS — Z510 Encounter for antineoplastic radiation therapy: Secondary | ICD-10-CM | POA: Diagnosis not present

## 2018-03-26 ENCOUNTER — Ambulatory Visit
Admission: RE | Admit: 2018-03-26 | Discharge: 2018-03-26 | Disposition: A | Discharge status: Home / Self Care | Payer: Medicare Other | Source: Ambulatory Visit | Attending: Radiation Oncology | Admitting: Radiation Oncology

## 2018-03-26 DIAGNOSIS — C61 Malignant neoplasm of prostate: Secondary | ICD-10-CM | POA: Diagnosis not present

## 2018-03-26 DIAGNOSIS — Z510 Encounter for antineoplastic radiation therapy: Secondary | ICD-10-CM | POA: Diagnosis not present

## 2018-03-27 ENCOUNTER — Ambulatory Visit
Admission: RE | Admit: 2018-03-27 | Discharge: 2018-03-27 | Disposition: A | Discharge status: Home / Self Care | Payer: Medicare Other | Source: Ambulatory Visit | Attending: Radiation Oncology | Admitting: Radiation Oncology

## 2018-03-27 DIAGNOSIS — C61 Malignant neoplasm of prostate: Secondary | ICD-10-CM | POA: Diagnosis not present

## 2018-03-27 DIAGNOSIS — Z510 Encounter for antineoplastic radiation therapy: Secondary | ICD-10-CM | POA: Diagnosis not present

## 2018-03-30 ENCOUNTER — Ambulatory Visit
Admission: RE | Admit: 2018-03-30 | Discharge: 2018-03-30 | Disposition: A | Discharge status: Home / Self Care | Payer: Medicare Other | Source: Ambulatory Visit | Attending: Radiation Oncology | Admitting: Radiation Oncology

## 2018-03-30 ENCOUNTER — Encounter: Payer: Self-pay | Admitting: Neurology

## 2018-03-30 DIAGNOSIS — Z510 Encounter for antineoplastic radiation therapy: Secondary | ICD-10-CM | POA: Diagnosis not present

## 2018-03-30 DIAGNOSIS — C61 Malignant neoplasm of prostate: Secondary | ICD-10-CM | POA: Diagnosis not present

## 2018-03-31 ENCOUNTER — Ambulatory Visit
Admission: RE | Admit: 2018-03-31 | Discharge: 2018-03-31 | Disposition: A | Discharge status: Home / Self Care | Payer: Medicare Other | Source: Ambulatory Visit | Attending: Radiation Oncology | Admitting: Radiation Oncology

## 2018-03-31 DIAGNOSIS — Z510 Encounter for antineoplastic radiation therapy: Secondary | ICD-10-CM | POA: Diagnosis not present

## 2018-03-31 DIAGNOSIS — C61 Malignant neoplasm of prostate: Secondary | ICD-10-CM | POA: Diagnosis not present

## 2018-04-01 ENCOUNTER — Ambulatory Visit
Admission: RE | Admit: 2018-04-01 | Discharge: 2018-04-01 | Disposition: A | Discharge status: Home / Self Care | Payer: Medicare Other | Source: Ambulatory Visit | Attending: Radiation Oncology | Admitting: Radiation Oncology

## 2018-04-01 ENCOUNTER — Ambulatory Visit: Payer: Self-pay | Admitting: Neurology

## 2018-04-01 ENCOUNTER — Encounter: Payer: Self-pay | Admitting: Neurology

## 2018-04-01 ENCOUNTER — Ambulatory Visit: Payer: Medicare Other | Admitting: Neurology

## 2018-04-01 VITALS — BP 118/72 | HR 62 | Ht 66.0 in | Wt 185.0 lb

## 2018-04-01 DIAGNOSIS — H25813 Combined forms of age-related cataract, bilateral: Secondary | ICD-10-CM | POA: Diagnosis not present

## 2018-04-01 DIAGNOSIS — H02034 Senile entropion of left upper eyelid: Secondary | ICD-10-CM | POA: Diagnosis not present

## 2018-04-01 DIAGNOSIS — Z9989 Dependence on other enabling machines and devices: Secondary | ICD-10-CM

## 2018-04-01 DIAGNOSIS — H401132 Primary open-angle glaucoma, bilateral, moderate stage: Secondary | ICD-10-CM | POA: Diagnosis not present

## 2018-04-01 DIAGNOSIS — G4733 Obstructive sleep apnea (adult) (pediatric): Secondary | ICD-10-CM | POA: Diagnosis not present

## 2018-04-01 DIAGNOSIS — H5010 Unspecified exotropia: Secondary | ICD-10-CM | POA: Diagnosis not present

## 2018-04-01 DIAGNOSIS — H02054 Trichiasis without entropian left upper eyelid: Secondary | ICD-10-CM | POA: Diagnosis not present

## 2018-04-01 DIAGNOSIS — Z510 Encounter for antineoplastic radiation therapy: Secondary | ICD-10-CM | POA: Diagnosis not present

## 2018-04-01 DIAGNOSIS — C61 Malignant neoplasm of prostate: Secondary | ICD-10-CM | POA: Diagnosis not present

## 2018-04-01 NOTE — Patient Instructions (Addendum)
Please continue using your CPAP regularly. While your insurance requires that you use CPAP at least 4 hours each night on 70% of the nights, I recommend, that you not skip any nights and use it throughout the night if you can. Getting used to CPAP and staying with the treatment long term does take time and patience and discipline. Untreated obstructive sleep apnea when it is moderate to severe can have an adverse impact on cardiovascular health and raise her risk for heart disease, arrhythmias, hypertension, congestive heart failure, stroke and diabetes. Untreated obstructive sleep apnea causes sleep disruption, nonrestorative sleep, and sleep deprivation. This can have an impact on your day to day functioning and cause daytime sleepiness and impairment of cognitive function, memory loss, mood disturbance, and problems focussing. Using CPAP regularly can improve these symptoms.  You had done a great job using your CPAP in the first, now it's a little less.  For better tolerance, I suggest we reduce your pressure by one notch. This may cause less burning.   We should try to look at your compliance data in about one month again and also see if you can tell the difference in nasal discomfort. Please call in about a month or 6 weeks so we can pull your data online.

## 2018-04-01 NOTE — Progress Notes (Signed)
Subjective:    Patient ID: Jorge Martin is a 83 y.o. male.  HPI     Interim history:   Jorge Martin is an 83 year old right-handed gentleman with an underlying medical history of hypertension, hyperlipidemia, glaucoma, prediabetes, reflux disease recurrent headaches and borderline obesity, who presents for follow-up consultation of his obstructive sleep apnea, after recent sleep study testing. The patient is unaccompanied today. I first met him on 10/15/2017 at the request of Dr. Jaynee Eagles, at which time he reported snoring and daytime somnolence. He was advised to proceed with a sleep study. He had a baseline sleep study, followed by a CPAP titration study. I went over his test results with him in detail today. Baseline sleep study from 11/25/2017 showed a sleep latency of 19 minutes, REM latency was 64 minutes, sleep efficiency reduced at 57.8%. He had absence of slow-wave sleep, an increased percentage of stage II sleep and near normal percentage of REM sleep. Total AHI was 28.4 per hour, rising to 32.6 per hour during supine sleep and REM sleep AHI was 38 per hour. Average oxygen saturation was 93%, nadir was 76% with significant time below 89% saturation of over 1 hour. He had no significant PLMS. Based on his test results I suggested he proceed with a CPAP titration study. He had this on 12/29/2017. Sleep efficiency was 69.8%. Sleep latency was 32.5 minutes, REM latency 25.5 minutes. He had an increased percentage of slow-wave sleep and increased percentage of REM sleep, in keeping with rebound. CPAP was titrated via nasal cradle from 5 cm to 10 cm. On the final pressure his AHI was 0.3 per hour with brief supine REM sleep achieved an O2 nadir of 91%. He did not have any significant PLMS. Based on his test results I suggested a home CPAP treatment pressure of 10 cm.  Today, 04/01/2018: I reviewed his CPAP compliance data from 03/01/2018 through 03/30/2018, which is a total of 30 days, during which  time he used his CPAP 99 days with percent used days greater than 4 hours at 57%, indicating suboptimal compliance with an average usage of 4 hours and 27 minutes residual AHI at goal at 0.8 per hour, leak acceptable with the 95th percentile at 13.2 L/m on a pressure of 10 cm with EPR of 2. In the past 90 days his compliance for more than 4 hours has been 42%. In the very first month of starting CPAP therapy his compliance was better, in fact between 02/05/2018 and 03/06/2018 his compliance for more than 4 hours was 90% which is excellent. He reports doing well. CPAP is working well for him. He reports an improvement in his sleep quality and daytime sleepiness. His headaches are better as well. He has had some issues tolerating the pressure and had some change in his humidity setting as moisture was accumulating in his nasal mask. Nevertheless, he struggles with nasal burning and has to take the mask off in the middle of the night, tries to put it back on later.  Previously:  10/15/2017: (He) reports snoring and daytime somnolence. I reviewed your office note from 09/02/2017. He reports having had a sleep study many years ago, results unknown. His Epworth sleepiness score is 13 out of 24 today, fatigue score is 33 out of 63. He is widowed and lives alone. He has 5 grandchildren. He has worked in [employer], currently works part-time in [employer]. His wife died 2 years ago. He was incarcerated for 5 years. He is a nonsmoker and does  not drink alcohol currently, has a history of heavy drinking in the past by self-report, drinks caffeine in the form of coffee, 2 cups per day on average. He does not have a Hx of migraine. No FHx of OSA. He has a bedtime of around 8:30 to 9. He has a TV on in the bedroom which tends to stay on all night. His rise time is around 3. He does have significant nocturia about 3 times per average night, he has had some morning headaches as well.  His Past Medical History Is  Significant For: Past Medical History:  Diagnosis Date  . Arthritis   . ED (erectile dysfunction)   . GERD (gastroesophageal reflux disease)   . Headache   . History of alcoholism ([place])    02-17-2018  per pt quit 1999  . History of gout 09/2011   right ankle  . Hypercholesteremia   . Hyperplasia of prostate with lower urinary tract symptoms (LUTS)   . Hypertension   . OSA on CPAP followed by dr Rexene Alberts   study 11-25-2017 moderate to severe osa (AHI 28.4/hr,  REM AHI 38/hr)  . Pre-diabetes   . Prostate cancer [facility]) urologist-  dr Alyson Ingles  onologist-  dr Tammi Klippel   dx 11-18-2017  Stage T2a,  Gleason 4+3,  PSA 16.90,  vol 124cc-- treatment plan external beam radiation  . Seasonal allergic rhinitis   . Weak urinary stream     His Past Surgical History Is Significant For: Past Surgical History:  Procedure Laterality Date  . GOLD SEED IMPLANT N/A 02/23/2018   Procedure: GOLD SEED IMPLANT;  Surgeon: Cleon Gustin, MD;  Location: [facility];  Service: Urology;  Laterality: N/A;  . NO PAST SURGERIES    . PROSTATE BIOPSY  11-18-2017  dr Alyson Ingles  office  . SPACE OAR INSTILLATION N/A 02/23/2018   Procedure: SPACE OAR INSTILLATION;  Surgeon: Cleon Gustin, MD;  Location: [facility];  Service: Urology;  Laterality: N/A;    His Family History Is Significant For: Family History  Problem Relation Age of Onset  . Hyperlipidemia Mother   . Hyperlipidemia Father   . Cancer Maternal Aunt        stomach    His Social History Is Significant For: Social History   Socioeconomic History  . Marital status: Widowed    Spouse name: Not on file  . Number of children: 8  . Years of education: 6th grade  . Highest education level: Not on file  Occupational History  . Not on file  Social Needs  . Financial resource strain: Not on file  . Food insecurity:    Worry: Not on file    Inability: Not on file  . Transportation needs:    Medical: Not on  file    Non-medical: Not on file  Tobacco Use  . Smoking status: Never Smoker  . Smokeless tobacco: Never Used  Substance and Sexual Activity  . Alcohol use: Not Currently    Comment: hx heavy alcohol  -- quit 1999  . Drug use: No  . Sexual activity: Yes  Lifestyle  . Physical activity:    Days per week: Not on file    Minutes per session: Not on file  . Stress: Not on file  Relationships  . Social connections:    Talks on phone: Not on file    Gets together: Not on file    Attends religious service: Not on file    Active member  of club or organization: Not on file    Attends meetings of clubs or organizations: Not on file    Relationship status: Not on file  Other Topics Concern  . Not on file  Social History Narrative   He lives at home alone   Drinks 2 cups of coffee daily    Right handed    His Allergies Are:  No Known Allergies:   His Current Medications Are:  Outpatient Encounter Medications as of 04/01/2018  Medication Sig  . alfuzosin (UROXATRAL) 10 MG 24 hr tablet Take 10 mg by mouth 2 (two) times daily.   [name] amLODipine (NORVASC) 5 MG tablet TAKE 2 TABLETS BY MOUTH DAILY.----  takes in pm  . atorvastatin (LIPITOR) 20 MG tablet TAKE 1 TABLET BY MOUTH DAILY. (Patient taking differently: TAKE 1 TABLET BY MOUTH DAILY.  --- takes in pm)  . cetirizine (ZYRTEC) 10 MG tablet Take 10 mg by mouth at bedtime.  . dronabinol (MARINOL) 2.5 MG capsule Take 1 capsule (2.5 mg total) by mouth 2 (two) times daily before lunch and supper.  . esomeprazole (NEXIUM) 40 MG capsule TAKE 1 CAPSULE BY MOUTH DAILY BEFORE BREAKFAST.  [name] traMADol (ULTRAM) 50 MG tablet Take 1 tablet (50 mg total) by mouth every 6 (six) hours as needed.  . triamcinolone (NASACORT) 55 MCG/ACT AERO nasal inhaler Place 2 sprays into the nose daily. (Patient taking differently: Place 2 sprays into the nose every evening. )   No facility-administered encounter medications on file as of 04/01/2018.   :  Review of  Systems:  Out of a complete 14 point review of systems, all are reviewed and negative with the exception of these symptoms as listed below: Review of Systems  Neurological:       Pt presents today to discuss his cpap. Pt reports that his cpap is going well.    Objective:  Neurological Exam  Physical Exam Physical Examination:   Vitals:   04/01/18 1255  BP: 118/72  Pulse: 62   General Examination: The patient is a very pleasant 83 y.o. male in no acute distress. He appears well-developed and well-nourished and well groomed.   HEENT: Normocephalic, atraumatic, pupils are equal, round and reactive to light and accommodation. Extraocular tracking is good without limitation to gaze excursion or nystagmus noted. Normal smooth pursuit is noted. Hearing is grossly intact. Face is symmetric with normal facial animation and normal facial sensation. Speech is clear with no dysarthria noted. There is no hypophonia. There is no lip, neck/head, jaw or voice tremor. Neck is supple with full range of passive and active motion. Oropharynx exam reveals: mild mouth dryness, adequate dental hygiene with several teeth missing, and moderate airway crowding. Tongue protrudes centrally and palate elevates symmetrically.  Chest: Clear to auscultation without wheezing, rhonchi or crackles noted.  Heart: S1+S2+0, regular and normal without murmurs, rubs or gallops noted.   Abdomen: Soft, non-tender and non-distended with normal bowel sounds appreciated on auscultation.  Extremities: There is no pitting edema in the distal lower extremities bilaterally. Pedal pulses are intact.  Skin: Warm and dry without trophic changes noted.  Musculoskeletal: exam reveals no obvious joint deformities, tenderness or joint swelling or erythema.   Neurologically:  Mental status: The patient is awake, alert and oriented in all 4 spheres. His immediate and remote memory, attention, language skills and fund of knowledge  are appropriate. There is no evidence of aphasia, agnosia, apraxia or anomia. Speech is clear with normal prosody and enunciation. Thought process  is linear. Mood is normal and affect is normal.  Cranial nerves II - XII are as described above under HEENT exam. Motor exam: Normal bulk, strength and tone is noted. There is no drift, tremor or rebound. Fine motor skills and coordination: intact for age.   Cerebellar testing: No dysmetria or intention tremor. There is no truncal or gait ataxia.  Sensory exam: intact to light touch in the upper and lower extremities.  Gait, station and balance: He stands easily. No veering to one side is noted. No leaning to one side is noted. Posture is age-appropriate and stance is narrow based. Gait shows normal stride length and normal pace. No problems turning are noted.   Assessment and Plan:  In summary, Jorge Martin is a very pleasant 83 year old male with an underlying medical history of hypertension, hyperlipidemia, glaucoma, prediabetes, reflux disease, recurrent headaches and borderline obesity, who presents for follow-up consultation of his moderate to severe obstructive sleep apnea, now on treatment with CPAP of 10 cm.  we talked about his sleep study results from his baseline sleep study in March 2019 as well as CPAP titration study from April 2019. We reviewed his compliance data as well. He is commended for his treatment adherence, especially in the beginning. He has had some difficulty tolerating the pressure or the air with complaints of nasal burning. I suggested we reduce the pressure slightly by 1 cm to see if he has better tolerance of the air and less nasal burning. He would be willing to try this. I would like for him to give [place] a call in 4-6 weeks so we can pull another compliance download at the time and see how he's doing. Otherwise, he is stable enough for a follow-up routinely for sleep apnea in 6 months, he can see one of our nurse  practitioners. I talked to the patient again about the risks and ramifications of untreated moderate to severe sleep apnea especially with respect to cardiovascular complications down the road. His blood pressure looks good today.  I explained the importance of being compliant with PAP treatment, not only for insurance purposes but primarily to improve His symptoms, and for the patient's long term health benefit, including to reduce His cardiovascular risks. I answered all him questions today and the patient was in agreement. I spent 25 minutes in total face-to-face time with the patient, more than 50% of which was spent in counseling and coordination of care, reviewing test results, reviewing medication and discussing or reviewing the diagnosis of OSA, its prognosis and treatment options. Pertinent laboratory and imaging test results that were available during this visit with the patient were reviewed by me and considered in my medical decision making (see chart for details).

## 2018-04-02 ENCOUNTER — Ambulatory Visit
Admission: RE | Admit: 2018-04-02 | Discharge: 2018-04-02 | Disposition: A | Discharge status: Home / Self Care | Payer: Medicare Other | Source: Ambulatory Visit | Attending: Radiation Oncology | Admitting: Radiation Oncology

## 2018-04-02 DIAGNOSIS — C61 Malignant neoplasm of prostate: Secondary | ICD-10-CM | POA: Diagnosis not present

## 2018-04-02 DIAGNOSIS — Z510 Encounter for antineoplastic radiation therapy: Secondary | ICD-10-CM | POA: Diagnosis not present

## 2018-04-02 MED FILL — LATANOPROST 0.005% EYE DRP: 0.005 | 18 days supply | Qty: 3 | Fill #1

## 2018-04-02 MED FILL — ALFUZOSIN HCL ER 10 MG TAB: 10 | 30 days supply | Qty: 60 | Fill #4 | Status: TO

## 2018-04-02 MED FILL — ATORVASTATIN 20 MG TABLET: 20 | 30 days supply | Qty: 30 | Fill #3

## 2018-04-03 ENCOUNTER — Ambulatory Visit
Admission: RE | Admit: 2018-04-03 | Discharge: 2018-04-03 | Disposition: A | Discharge status: Home / Self Care | Payer: Medicare Other | Source: Ambulatory Visit | Attending: Radiation Oncology | Admitting: Radiation Oncology

## 2018-04-03 DIAGNOSIS — G4733 Obstructive sleep apnea (adult) (pediatric): Secondary | ICD-10-CM | POA: Diagnosis not present

## 2018-04-03 DIAGNOSIS — C61 Malignant neoplasm of prostate: Secondary | ICD-10-CM | POA: Diagnosis not present

## 2018-04-03 DIAGNOSIS — Z510 Encounter for antineoplastic radiation therapy: Secondary | ICD-10-CM | POA: Diagnosis not present

## 2018-04-06 ENCOUNTER — Ambulatory Visit
Admission: RE | Admit: 2018-04-06 | Discharge: 2018-04-06 | Disposition: A | Discharge status: Home / Self Care | Payer: Medicare Other | Source: Ambulatory Visit | Attending: Radiation Oncology | Admitting: Radiation Oncology

## 2018-04-06 DIAGNOSIS — C61 Malignant neoplasm of prostate: Secondary | ICD-10-CM | POA: Diagnosis not present

## 2018-04-06 DIAGNOSIS — Z510 Encounter for antineoplastic radiation therapy: Secondary | ICD-10-CM | POA: Diagnosis not present

## 2018-04-07 ENCOUNTER — Ambulatory Visit
Admission: RE | Admit: 2018-04-07 | Discharge: 2018-04-07 | Disposition: A | Discharge status: Home / Self Care | Payer: Medicare Other | Source: Ambulatory Visit | Attending: Radiation Oncology | Admitting: Radiation Oncology

## 2018-04-07 DIAGNOSIS — C61 Malignant neoplasm of prostate: Secondary | ICD-10-CM | POA: Diagnosis not present

## 2018-04-07 DIAGNOSIS — G4733 Obstructive sleep apnea (adult) (pediatric): Secondary | ICD-10-CM | POA: Diagnosis not present

## 2018-04-07 DIAGNOSIS — Z510 Encounter for antineoplastic radiation therapy: Secondary | ICD-10-CM | POA: Diagnosis not present

## 2018-04-08 ENCOUNTER — Ambulatory Visit
Admission: RE | Admit: 2018-04-08 | Discharge: 2018-04-08 | Disposition: A | Discharge status: Home / Self Care | Payer: Medicare Other | Source: Ambulatory Visit | Attending: Radiation Oncology | Admitting: Radiation Oncology

## 2018-04-08 DIAGNOSIS — Z510 Encounter for antineoplastic radiation therapy: Secondary | ICD-10-CM | POA: Diagnosis not present

## 2018-04-08 DIAGNOSIS — C61 Malignant neoplasm of prostate: Secondary | ICD-10-CM | POA: Diagnosis not present

## 2018-04-09 ENCOUNTER — Ambulatory Visit
Admission: RE | Admit: 2018-04-09 | Discharge: 2018-04-09 | Disposition: A | Discharge status: Home / Self Care | Payer: Medicare Other | Source: Ambulatory Visit | Attending: Radiation Oncology | Admitting: Radiation Oncology

## 2018-04-09 DIAGNOSIS — C61 Malignant neoplasm of prostate: Secondary | ICD-10-CM | POA: Diagnosis not present

## 2018-04-09 DIAGNOSIS — Z510 Encounter for antineoplastic radiation therapy: Secondary | ICD-10-CM | POA: Diagnosis not present

## 2018-04-10 ENCOUNTER — Ambulatory Visit
Admission: RE | Admit: 2018-04-10 | Discharge: 2018-04-10 | Disposition: A | Discharge status: Home / Self Care | Payer: Medicare Other | Source: Ambulatory Visit | Attending: Radiation Oncology | Admitting: Radiation Oncology

## 2018-04-10 ENCOUNTER — Other Ambulatory Visit: Payer: Self-pay | Admitting: Radiation Oncology

## 2018-04-10 DIAGNOSIS — C61 Malignant neoplasm of prostate: Secondary | ICD-10-CM | POA: Diagnosis not present

## 2018-04-10 DIAGNOSIS — Z510 Encounter for antineoplastic radiation therapy: Secondary | ICD-10-CM | POA: Diagnosis not present

## 2018-04-10 MED ORDER — PHENAZOPYRIDINE HCL 200 MG PO TABS: 200.0000 mg | ORAL_TABLET | Freq: Three times a day (TID) | ORAL | 4 refills | Status: DC | PRN

## 2018-04-13 ENCOUNTER — Ambulatory Visit
Admission: RE | Admit: 2018-04-13 | Discharge: 2018-04-13 | Disposition: A | Discharge status: Home / Self Care | Payer: Medicare Other | Source: Ambulatory Visit | Attending: Radiation Oncology | Admitting: Radiation Oncology

## 2018-04-13 DIAGNOSIS — Z510 Encounter for antineoplastic radiation therapy: Secondary | ICD-10-CM | POA: Diagnosis not present

## 2018-04-13 DIAGNOSIS — C61 Malignant neoplasm of prostate: Secondary | ICD-10-CM | POA: Diagnosis not present

## 2018-04-14 ENCOUNTER — Ambulatory Visit
Admission: RE | Admit: 2018-04-14 | Discharge: 2018-04-14 | Disposition: A | Discharge status: Home / Self Care | Payer: Medicare Other | Source: Ambulatory Visit | Attending: Radiation Oncology | Admitting: Radiation Oncology

## 2018-04-14 DIAGNOSIS — Z510 Encounter for antineoplastic radiation therapy: Secondary | ICD-10-CM | POA: Diagnosis not present

## 2018-04-14 DIAGNOSIS — C61 Malignant neoplasm of prostate: Secondary | ICD-10-CM | POA: Diagnosis not present

## 2018-04-15 ENCOUNTER — Ambulatory Visit
Admission: RE | Admit: 2018-04-15 | Discharge: 2018-04-15 | Disposition: A | Discharge status: Home / Self Care | Payer: Medicare Other | Source: Ambulatory Visit | Attending: Radiation Oncology | Admitting: Radiation Oncology

## 2018-04-15 DIAGNOSIS — Z510 Encounter for antineoplastic radiation therapy: Secondary | ICD-10-CM | POA: Diagnosis not present

## 2018-04-15 DIAGNOSIS — C61 Malignant neoplasm of prostate: Secondary | ICD-10-CM | POA: Diagnosis not present

## 2018-04-16 ENCOUNTER — Ambulatory Visit
Admission: RE | Admit: 2018-04-16 | Discharge: 2018-04-16 | Disposition: A | Discharge status: Home / Self Care | Payer: Medicare Other | Source: Ambulatory Visit | Attending: Radiation Oncology | Admitting: Radiation Oncology

## 2018-04-16 DIAGNOSIS — Z510 Encounter for antineoplastic radiation therapy: Secondary | ICD-10-CM | POA: Diagnosis not present

## 2018-04-16 DIAGNOSIS — C61 Malignant neoplasm of prostate: Secondary | ICD-10-CM | POA: Diagnosis not present

## 2018-04-17 ENCOUNTER — Ambulatory Visit
Admission: RE | Admit: 2018-04-17 | Discharge: 2018-04-17 | Disposition: A | Discharge status: Home / Self Care | Payer: Medicare Other | Source: Ambulatory Visit | Attending: Radiation Oncology | Admitting: Radiation Oncology

## 2018-04-17 DIAGNOSIS — Z510 Encounter for antineoplastic radiation therapy: Secondary | ICD-10-CM | POA: Diagnosis not present

## 2018-04-17 DIAGNOSIS — C61 Malignant neoplasm of prostate: Secondary | ICD-10-CM | POA: Diagnosis not present

## 2018-04-20 ENCOUNTER — Ambulatory Visit
Admission: RE | Admit: 2018-04-20 | Discharge: 2018-04-20 | Disposition: A | Discharge status: Home / Self Care | Payer: Medicare Other | Source: Ambulatory Visit | Attending: Radiation Oncology | Admitting: Radiation Oncology

## 2018-04-20 DIAGNOSIS — C61 Malignant neoplasm of prostate: Secondary | ICD-10-CM | POA: Diagnosis not present

## 2018-04-20 DIAGNOSIS — Z510 Encounter for antineoplastic radiation therapy: Secondary | ICD-10-CM | POA: Diagnosis not present

## 2018-04-21 ENCOUNTER — Ambulatory Visit
Admission: RE | Admit: 2018-04-21 | Discharge: 2018-04-21 | Disposition: A | Discharge status: Home / Self Care | Payer: Medicare Other | Source: Ambulatory Visit | Attending: Radiation Oncology | Admitting: Radiation Oncology

## 2018-04-21 DIAGNOSIS — C61 Malignant neoplasm of prostate: Secondary | ICD-10-CM | POA: Diagnosis not present

## 2018-04-21 DIAGNOSIS — Z510 Encounter for antineoplastic radiation therapy: Secondary | ICD-10-CM | POA: Diagnosis not present

## 2018-04-21 MED FILL — !VIAGRA 100 MG TABLET: 100 MG | 28 days supply | Qty: 4 | Fill #3

## 2018-04-22 ENCOUNTER — Ambulatory Visit
Admission: RE | Admit: 2018-04-22 | Discharge: 2018-04-22 | Disposition: A | Discharge status: Home / Self Care | Payer: Medicare Other | Source: Ambulatory Visit | Attending: Radiation Oncology | Admitting: Radiation Oncology

## 2018-04-22 DIAGNOSIS — Z510 Encounter for antineoplastic radiation therapy: Secondary | ICD-10-CM | POA: Diagnosis not present

## 2018-04-22 DIAGNOSIS — C61 Malignant neoplasm of prostate: Secondary | ICD-10-CM | POA: Diagnosis not present

## 2018-04-23 ENCOUNTER — Ambulatory Visit
Admission: RE | Admit: 2018-04-23 | Discharge: 2018-04-23 | Disposition: A | Discharge status: Home / Self Care | Payer: Medicare Other | Source: Ambulatory Visit | Attending: Radiation Oncology | Admitting: Radiation Oncology

## 2018-04-23 DIAGNOSIS — C61 Malignant neoplasm of prostate: Secondary | ICD-10-CM | POA: Diagnosis not present

## 2018-04-23 DIAGNOSIS — Z510 Encounter for antineoplastic radiation therapy: Secondary | ICD-10-CM | POA: Diagnosis not present

## 2018-04-24 ENCOUNTER — Ambulatory Visit
Admission: RE | Admit: 2018-04-24 | Discharge: 2018-04-24 | Disposition: A | Discharge status: Home / Self Care | Payer: Medicare Other | Source: Ambulatory Visit | Attending: Radiation Oncology | Admitting: Radiation Oncology

## 2018-04-24 DIAGNOSIS — C61 Malignant neoplasm of prostate: Secondary | ICD-10-CM | POA: Diagnosis not present

## 2018-04-24 DIAGNOSIS — Z510 Encounter for antineoplastic radiation therapy: Secondary | ICD-10-CM | POA: Diagnosis not present

## 2018-04-27 ENCOUNTER — Ambulatory Visit
Admission: RE | Admit: 2018-04-27 | Discharge: 2018-04-27 | Disposition: A | Discharge status: Home / Self Care | Payer: Medicare Other | Source: Ambulatory Visit | Attending: Radiation Oncology | Admitting: Radiation Oncology

## 2018-04-27 DIAGNOSIS — C61 Malignant neoplasm of prostate: Secondary | ICD-10-CM | POA: Diagnosis not present

## 2018-04-27 DIAGNOSIS — Z510 Encounter for antineoplastic radiation therapy: Secondary | ICD-10-CM | POA: Diagnosis not present

## 2018-04-27 MED FILL — AMLODIPINE BESYLATE 5 MG TA: 5 | 30 days supply | Qty: 60 | Fill #1 | Status: TO

## 2018-04-28 ENCOUNTER — Ambulatory Visit
Admission: RE | Admit: 2018-04-28 | Discharge: 2018-04-28 | Disposition: A | Discharge status: Home / Self Care | Payer: Medicare Other | Source: Ambulatory Visit | Attending: Radiation Oncology | Admitting: Radiation Oncology

## 2018-04-28 DIAGNOSIS — Z510 Encounter for antineoplastic radiation therapy: Secondary | ICD-10-CM | POA: Diagnosis not present

## 2018-04-28 DIAGNOSIS — C61 Malignant neoplasm of prostate: Secondary | ICD-10-CM | POA: Diagnosis not present

## 2018-04-29 ENCOUNTER — Encounter: Payer: Self-pay | Admitting: Family Medicine

## 2018-04-29 ENCOUNTER — Ambulatory Visit (INDEPENDENT_AMBULATORY_CARE_PROVIDER_SITE_OTHER): Payer: Medicare Other | Admitting: Family Medicine

## 2018-04-29 ENCOUNTER — Ambulatory Visit
Admission: RE | Admit: 2018-04-29 | Discharge: 2018-04-29 | Disposition: A | Discharge status: Home / Self Care | Payer: Medicare Other | Source: Ambulatory Visit | Attending: Radiation Oncology | Admitting: Radiation Oncology

## 2018-04-29 VITALS — BP 128/70 | HR 84 | Temp 98.0°F | Ht 66.0 in | Wt 185.0 lb

## 2018-04-29 DIAGNOSIS — Z510 Encounter for antineoplastic radiation therapy: Secondary | ICD-10-CM | POA: Diagnosis not present

## 2018-04-29 DIAGNOSIS — C61 Malignant neoplasm of prostate: Secondary | ICD-10-CM | POA: Diagnosis not present

## 2018-04-29 DIAGNOSIS — I10 Essential (primary) hypertension: Secondary | ICD-10-CM

## 2018-04-29 DIAGNOSIS — Z09 Encounter for follow-up examination after completed treatment for conditions other than malignant neoplasm: Secondary | ICD-10-CM

## 2018-04-29 DIAGNOSIS — R7303 Prediabetes: Secondary | ICD-10-CM

## 2018-04-29 DIAGNOSIS — R3912 Poor urinary stream: Secondary | ICD-10-CM

## 2018-04-29 DIAGNOSIS — Z23 Encounter for immunization: Secondary | ICD-10-CM | POA: Diagnosis not present

## 2018-04-29 LAB — POCT URINALYSIS DIP (MANUAL ENTRY)
Bilirubin, UA: NEGATIVE
Blood, UA: NEGATIVE
Glucose, UA: NEGATIVE mg/dL
Ketones, POC UA: NEGATIVE mg/dL
Leukocytes, UA: NEGATIVE
Nitrite, UA: NEGATIVE
Protein Ur, POC: 30 mg/dL — AB
Spec Grav, UA: 1.02 (ref 1.010–1.025)
Urobilinogen, UA: 2 E.U./dL — AB
pH, UA: 5.5 (ref 5.0–8.0)

## 2018-04-29 LAB — POCT GLYCOSYLATED HEMOGLOBIN (HGB A1C): Hemoglobin A1C: 5.9 % — AB (ref 4.0–5.6)

## 2018-04-29 NOTE — Progress Notes (Signed)
Follow Up  Subjective:    Patient ID: Jorge Martin, male    DOB: 10/03/1934, 83 y.o.   MRN: [id number]   HPI  Jorge Martin has a past medical history Prostate Cancer, Hypertension, Hyperlipidemia, Headaches, GERD, ED, and Arthritis.  Current Status: Since his last office visit, he is doing well with no complaints. He is currently receiving Radiation treatments for Prostate cancer. He has completed 28 of 40 treatment so far. He is tolerating treatment well.   He denies fevers, chills, fatigue, recent infections, weight loss, and night sweats.   He has not had any headaches, visual changes, dizziness, and falls.   No chest pain, heart palpitations, cough and shortness of breath reported.   No reports of GI problems such as nausea, vomiting, diarrhea, and constipation. He has no reports of blood in stools, dysuria and hematuria.   No depression or anxiety reported.   He denies pain today.   Past Medical History:  Diagnosis Date  . Arthritis   . ED (erectile dysfunction)   . GERD (gastroesophageal reflux disease)   . Headache   . History of alcoholism ([place])    02-17-2018  per pt quit 1999  . History of gout 09/2011   right ankle  . Hypercholesteremia   . Hyperplasia of prostate with lower urinary tract symptoms (LUTS)   . Hypertension   . OSA on CPAP followed by dr Rexene Alberts   study 11-25-2017 moderate to severe osa (AHI 28.4/hr,  REM AHI 38/hr)  . Pre-diabetes   . Prostate cancer [facility]) urologist-  dr Alyson Ingles  onologist-  dr Tammi Klippel   dx 11-18-2017  Stage T2a,  Gleason 4+3,  PSA 16.90,  vol 124cc-- treatment plan external beam radiation  . Seasonal allergic rhinitis   . Weak urinary stream     Family History  Problem Relation Age of Onset  . Hyperlipidemia Mother   . Hyperlipidemia Father   . Cancer Maternal Aunt        stomach    Social History   Socioeconomic History  . Marital status: Widowed    Spouse name: Not on file  . Number of children: 8  . Years of  education: 6th grade  . Highest education level: Not on file  Occupational History  . Not on file  Social Needs  . Financial resource strain: Not on file  . Food insecurity:    Worry: Not on file    Inability: Not on file  . Transportation needs:    Medical: Not on file    Non-medical: Not on file  Tobacco Use  . Smoking status: Never Smoker  . Smokeless tobacco: Never Used  Substance and Sexual Activity  . Alcohol use: Not Currently    Comment: hx heavy alcohol  -- quit 1999  . Drug use: No  . Sexual activity: Yes  Lifestyle  . Physical activity:    Days per week: Not on file    Minutes per session: Not on file  . Stress: Not on file  Relationships  . Social connections:    Talks on phone: Not on file    Gets together: Not on file    Attends religious service: Not on file    Active member of club or organization: Not on file    Attends meetings of clubs or organizations: Not on file    Relationship status: Not on file  . Intimate partner violence:    Fear of current or ex partner: Not on file  Emotionally abused: Not on file    Physically abused: Not on file    Forced sexual activity: Not on file  Other Topics Concern  . Not on file  Social History Narrative   He lives at home alone   Drinks 2 cups of coffee daily    Right handed    Past Surgical History:  Procedure Laterality Date  . GOLD SEED IMPLANT N/A 02/23/2018   Procedure: GOLD SEED IMPLANT;  Surgeon: Cleon Gustin, MD;  Location: [facility];  Service: Urology;  Laterality: N/A;  . NO PAST SURGERIES    . PROSTATE BIOPSY  11-18-2017  dr Alyson Ingles  office  . SPACE OAR INSTILLATION N/A 02/23/2018   Procedure: SPACE OAR INSTILLATION;  Surgeon: Cleon Gustin, MD;  Location: [facility];  Service: Urology;  Laterality: N/A;    Immunization History  Administered Date(s) Administered  . Influenza,inj,Quad PF,6+ Mos 05/15/2017, 04/29/2018  . Influenza-Unspecified  11/29/2015  . Pneumococcal Conjugate-13 01/30/2017  . Tdap 01/30/2017    Current Meds  Medication Sig  . alfuzosin (UROXATRAL) 10 MG 24 hr tablet Take 10 mg by mouth 2 (two) times daily.   [name] amLODipine (NORVASC) 5 MG tablet TAKE 2 TABLETS BY MOUTH DAILY.----  takes in pm  . atorvastatin (LIPITOR) 20 MG tablet TAKE 1 TABLET BY MOUTH DAILY. (Patient taking differently: TAKE 1 TABLET BY MOUTH DAILY.  --- takes in pm)  . dronabinol (MARINOL) 2.5 MG capsule Take 1 capsule (2.5 mg total) by mouth 2 (two) times daily before lunch and supper.  . esomeprazole (NEXIUM) 40 MG capsule TAKE 1 CAPSULE BY MOUTH DAILY BEFORE BREAKFAST.    No Known Allergies  BP 128/70 (BP Location: Left Arm, Patient Position: Sitting, Cuff Size: Small)   Pulse 84   Temp 98 F (36.7 C) (Oral)   Ht 5\' 6"  (1.676 m)   Wt 185 lb (83.9 kg)   SpO2 96%   BMI 29.86 kg/m    Review of Systems  Gastrointestinal: Positive for nausea (r/t to Radiation Treatments).  Musculoskeletal:       Gerneralized  Skin: Negative.   Neurological: Positive for dizziness (Occasional) and headaches (occasional).  Hematological: Negative.   Psychiatric/Behavioral: Negative.    Objective:   Physical Exam  Pulmonary/Chest: Effort normal and breath sounds normal.   Assessment & Plan:   1. Essential hypertension Blood pressure is stable today at 128/70 today. He will continue Amlodipine as prescribed. He will continue to decrease high sodium intake, excessive alcohol intake, increase potassium intake, smoking cessation, and increase physical activity of at least 30 minutes of cardio activity daily. She will continue to follow Heart Healthy or DASH diet.  2. Weak urinary stream Stable. Not worsening. We will continue to monitor.   3. Prediabetes Hgb A1c is stable at 5.9 today.  He will continue to decrease foods/beverages high in sugars and carbs and follow Heart Healthy or DASH diet. Increase physical activity to at least 30 minutes  cardio exercise daily.   - POCT glycosylated hemoglobin (Hb A1C) - POCT urinalysis dipstick  4. Need for immunization against influenza - Flu Vaccine QUAD 36+ mos IM  5. Malignant neoplasm of prostate ([place]) Stable. He is currently receiving daily Radiation treatments. He is tolerating well. Continue Alfuzosin as prescribed. He will follow up with Oncologist as needed.   6. Follow up He will follow up with [place] in 3 months.   No orders of the defined types were placed in this encounter.  Kathe Becton,  MSN, FNP-C [facility] [address] [place], [place] [address] [phone]

## 2018-04-30 ENCOUNTER — Ambulatory Visit
Admission: RE | Admit: 2018-04-30 | Discharge: 2018-04-30 | Disposition: A | Discharge status: Home / Self Care | Payer: Medicare Other | Source: Ambulatory Visit | Attending: Radiation Oncology | Admitting: Radiation Oncology

## 2018-04-30 DIAGNOSIS — C61 Malignant neoplasm of prostate: Secondary | ICD-10-CM | POA: Diagnosis not present

## 2018-04-30 DIAGNOSIS — Z510 Encounter for antineoplastic radiation therapy: Secondary | ICD-10-CM | POA: Diagnosis not present

## 2018-05-01 ENCOUNTER — Ambulatory Visit
Admission: RE | Admit: 2018-05-01 | Discharge: 2018-05-01 | Disposition: A | Discharge status: Home / Self Care | Payer: Medicare Other | Source: Ambulatory Visit | Attending: Radiation Oncology | Admitting: Radiation Oncology

## 2018-05-01 ENCOUNTER — Other Ambulatory Visit: Payer: Self-pay | Admitting: Radiation Oncology

## 2018-05-01 DIAGNOSIS — Z510 Encounter for antineoplastic radiation therapy: Secondary | ICD-10-CM | POA: Diagnosis not present

## 2018-05-01 DIAGNOSIS — C61 Malignant neoplasm of prostate: Secondary | ICD-10-CM | POA: Diagnosis not present

## 2018-05-01 MED ORDER — METRONIDAZOLE 500 MG PO TABS: 500.0000 mg | ORAL_TABLET | Freq: Three times a day (TID) | ORAL | 0 refills | Status: AC

## 2018-05-03 ENCOUNTER — Ambulatory Visit: Admission: RE | Admit: 2018-05-03 | Payer: Medicare Other | Source: Ambulatory Visit

## 2018-05-04 ENCOUNTER — Ambulatory Visit
Admission: RE | Admit: 2018-05-04 | Discharge: 2018-05-04 | Disposition: A | Discharge status: Home / Self Care | Payer: Medicare Other | Source: Ambulatory Visit | Attending: Radiation Oncology | Admitting: Radiation Oncology

## 2018-05-04 DIAGNOSIS — Z510 Encounter for antineoplastic radiation therapy: Secondary | ICD-10-CM | POA: Diagnosis not present

## 2018-05-04 DIAGNOSIS — C61 Malignant neoplasm of prostate: Secondary | ICD-10-CM | POA: Diagnosis not present

## 2018-05-05 ENCOUNTER — Ambulatory Visit
Admission: RE | Admit: 2018-05-05 | Discharge: 2018-05-05 | Disposition: A | Discharge status: Home / Self Care | Payer: Medicare Other | Source: Ambulatory Visit | Attending: Radiation Oncology | Admitting: Radiation Oncology

## 2018-05-05 DIAGNOSIS — Z510 Encounter for antineoplastic radiation therapy: Secondary | ICD-10-CM | POA: Diagnosis not present

## 2018-05-05 DIAGNOSIS — C61 Malignant neoplasm of prostate: Secondary | ICD-10-CM | POA: Diagnosis not present

## 2018-05-06 ENCOUNTER — Ambulatory Visit
Admission: RE | Admit: 2018-05-06 | Discharge: 2018-05-06 | Disposition: A | Discharge status: Home / Self Care | Payer: Medicare Other | Source: Ambulatory Visit | Attending: Radiation Oncology | Admitting: Radiation Oncology

## 2018-05-06 DIAGNOSIS — C61 Malignant neoplasm of prostate: Secondary | ICD-10-CM | POA: Diagnosis not present

## 2018-05-06 DIAGNOSIS — Z510 Encounter for antineoplastic radiation therapy: Secondary | ICD-10-CM | POA: Diagnosis not present

## 2018-05-07 ENCOUNTER — Ambulatory Visit
Admission: RE | Admit: 2018-05-07 | Discharge: 2018-05-07 | Disposition: A | Discharge status: Home / Self Care | Payer: Medicare Other | Source: Ambulatory Visit | Attending: Radiation Oncology | Admitting: Radiation Oncology

## 2018-05-07 DIAGNOSIS — Z510 Encounter for antineoplastic radiation therapy: Secondary | ICD-10-CM | POA: Diagnosis not present

## 2018-05-07 DIAGNOSIS — C61 Malignant neoplasm of prostate: Secondary | ICD-10-CM | POA: Diagnosis not present

## 2018-05-08 ENCOUNTER — Ambulatory Visit
Admission: RE | Admit: 2018-05-08 | Discharge: 2018-05-08 | Disposition: A | Discharge status: Home / Self Care | Payer: Medicare Other | Source: Ambulatory Visit | Attending: Radiation Oncology | Admitting: Radiation Oncology

## 2018-05-08 DIAGNOSIS — G4733 Obstructive sleep apnea (adult) (pediatric): Secondary | ICD-10-CM | POA: Diagnosis not present

## 2018-05-08 DIAGNOSIS — C61 Malignant neoplasm of prostate: Secondary | ICD-10-CM | POA: Diagnosis not present

## 2018-05-08 DIAGNOSIS — Z510 Encounter for antineoplastic radiation therapy: Secondary | ICD-10-CM | POA: Diagnosis not present

## 2018-05-11 ENCOUNTER — Ambulatory Visit: Payer: Medicare Other

## 2018-05-11 ENCOUNTER — Ambulatory Visit
Admission: RE | Admit: 2018-05-11 | Discharge: 2018-05-11 | Disposition: A | Discharge status: Home / Self Care | Payer: Medicare Other | Source: Ambulatory Visit | Attending: Radiation Oncology | Admitting: Radiation Oncology

## 2018-05-11 DIAGNOSIS — C61 Malignant neoplasm of prostate: Secondary | ICD-10-CM | POA: Diagnosis not present

## 2018-05-11 DIAGNOSIS — Z510 Encounter for antineoplastic radiation therapy: Secondary | ICD-10-CM | POA: Diagnosis not present

## 2018-05-12 ENCOUNTER — Ambulatory Visit
Admission: RE | Admit: 2018-05-12 | Discharge: 2018-05-12 | Disposition: A | Discharge status: Home / Self Care | Payer: Medicare Other | Source: Ambulatory Visit | Attending: Radiation Oncology | Admitting: Radiation Oncology

## 2018-05-12 ENCOUNTER — Encounter: Payer: Self-pay | Admitting: Radiation Oncology

## 2018-05-12 DIAGNOSIS — C61 Malignant neoplasm of prostate: Secondary | ICD-10-CM | POA: Diagnosis not present

## 2018-05-12 DIAGNOSIS — Z510 Encounter for antineoplastic radiation therapy: Secondary | ICD-10-CM | POA: Diagnosis not present

## 2018-05-13 ENCOUNTER — Other Ambulatory Visit: Payer: Self-pay

## 2018-05-13 NOTE — Patient Outreach (Signed)
[facility]) Care Management  05/13/2018  Jorge Martin [birth year] [id number]   Medication Adherence call to [name] spoke with patient he has move and is going to a new pharmacy [facility]) patient ask if we can call his new doctor and get a 90 days supply, call doctors office left a message to send a new prescription to [facility] for Atorvastatin 20 mg and a 90 days supply. [name] is showing past due under [place].   [facility] Management Direct Dial [phone]  Fax [phone] [email or web address]

## 2018-05-14 ENCOUNTER — Telehealth: Payer: Self-pay | Admitting: Radiation Oncology

## 2018-05-14 NOTE — Telephone Encounter (Signed)
-----   Message from Freeman Caldron, [place] sent at 05/13/2018  5:19 PM EDT ----- Regarding: RE: Marinol Contact: [phone] I do not know of anything else to prescribe for appetite, aside from steroids and at his age, that is not recommended so I would instead defer to his Urologist, Dr. Alyson Ingles. Ailene Ards ----- Message ----- From: Heywood Footman, RN Sent: 05/13/2018   3:13 PM EDT To: Tyler Pita, MD, Freeman Caldron, PA-C Subject: Marinol                                        83 y.o. gentleman with stage T2a adenocarcinoma of the prostate with Gleason score of 4+3, and PSA of 16.90.  Patient completed radiation on Tuesday. During the course of treatment Dr. Tammi Klippel prescribed dronabinol to stimulate his appetite. Patient doesn't feel like this medication is helping plus he has to pay for it out of pocket since his insurance won't cover it. He called today questioning if there was something different Dr. Tammi Klippel could prescribe. His preferred pharmacy is [facility], [employer].   Sam

## 2018-05-14 NOTE — Telephone Encounter (Signed)
Phoned patient back. Explained that [place], PA is unaware of anything else to prescribe for appetite aside from steroids but at his age this isn't recommended. I encouraged the patient to reach out to his primary care provider for input. Patient verbalized understanding and expressed appreciation for the return call.

## 2018-05-19 MED FILL — LATANOPROST 0.005% EYE DRP: 0.005 | 18 days supply | Qty: 3 | Fill #2

## 2018-05-19 MED FILL — ATORVASTATIN 20 MG TABLET: 20 | 30 days supply | Qty: 30 | Fill #4

## 2018-05-20 NOTE — Progress Notes (Signed)
  Radiation Oncology         [phone] ________________________________  Name: Jorge Martin MRN: [id number]  Date: 05/12/2018  DOB: [birth year]  End of Treatment Note  Diagnosis:   83 y.o. gentleman with stage T2a adenocarcinoma of the prostate with Gleason score of 4+3, and PSA of 16.90.  Indication for treatment:  Curative       Radiation treatment dates:   03/16/2018 to 05/12/2018  Site/dose:  1. The prostate, seminal vesicles, and pelvic lymph nodes were initially treated to 45 Gy in 25 fractions of 1.8 Gy  2. The prostate only was boosted to 75 Gy with 15 additional fractions of 2.0 Gy   Beams/energy:  1. The prostate, seminal vesicles, and pelvic lymph nodes were initially treated using VMAT intensity modulated radiotherapy delivering 6 megavolt photons. Image guidance was performed with CB-CT studies prior to each fraction. He was immobilized with a body fix lower extremity mold.  2. the prostate only was boosted using VMAT intensity modulated radiotherapy delivering 6 megavolt photons. Image guidance was performed with CB-CT studies prior to each fraction. He was immobilized with a body fix lower extremity mold.  Narrative: The patient tolerated radiation treatment relatively well.  He reported moderate urinary symptoms including mild occasional dysuria, urgency, occasional hesitancy, weak stream, and nocturia x5. He reported bowel movements alternating between constipation and diarrhea throughout his treatment but not requiring medical intervention. He also reported mild fatigue.  Plan: The patient has completed radiation treatment. The patient will return to radiation oncology clinic for routine followup in one month. I advised him to call or return sooner if he has any questions or concerns related to his recovery or treatment. ________________________________  Sheral Apley. Tammi Klippel, M.D.   This document serves as a record of services personally performed by Tyler Pita,  MD. It was created on his behalf by Arlyce Harman, a trained medical scribe. The creation of this record is based on the scribe's personal observations and the provider's statements to them. This document has been checked and approved by the attending provider.

## 2018-05-22 MED FILL — !VIAGRA 100 MG TABLET: 100 MG | 28 days supply | Qty: 4 | Fill #4

## 2018-05-26 ENCOUNTER — Ambulatory Visit: Payer: Medicare Other | Admitting: Family Medicine

## 2018-05-29 DIAGNOSIS — R3912 Poor urinary stream: Secondary | ICD-10-CM | POA: Diagnosis not present

## 2018-06-08 DIAGNOSIS — G4733 Obstructive sleep apnea (adult) (pediatric): Secondary | ICD-10-CM | POA: Diagnosis not present

## 2018-06-12 ENCOUNTER — Other Ambulatory Visit: Payer: Self-pay

## 2018-06-12 ENCOUNTER — Ambulatory Visit
Admission: RE | Admit: 2018-06-12 | Discharge: 2018-06-12 | Disposition: A | Discharge status: Home / Self Care | Payer: Medicare Other | Source: Ambulatory Visit | Attending: Urology | Admitting: Urology

## 2018-06-12 ENCOUNTER — Encounter: Payer: Self-pay | Admitting: Urology

## 2018-06-12 VITALS — BP 124/71 | HR 67 | Temp 98.2°F | Resp 18 | Ht 66.0 in | Wt 179.8 lb

## 2018-06-12 DIAGNOSIS — Z79899 Other long term (current) drug therapy: Secondary | ICD-10-CM | POA: Insufficient documentation

## 2018-06-12 DIAGNOSIS — C61 Malignant neoplasm of prostate: Secondary | ICD-10-CM | POA: Diagnosis present

## 2018-06-12 DIAGNOSIS — Z923 Personal history of irradiation: Secondary | ICD-10-CM | POA: Diagnosis not present

## 2018-06-12 NOTE — Addendum Note (Signed)
Encounter addended by: Malena Edman, RN on: 06/12/2018 10:13 AM  Actions taken: Charge Capture section accepted

## 2018-06-12 NOTE — Progress Notes (Signed)
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  Interval Since Last Radiation:  4 weeks  03/16/2018 to 05/12/2018: 1. The prostate, seminal vesicles, and pelvic lymph nodes were initially treated to 45 Gy in 25 fractions of 1.8 Gy  2. The prostate only was boosted to 75 Gy with 15 additional fractions of 2.0 Gy   Narrative:  The patient returns today for routine follow-up. He tolerated radiation treatment relatively well.  He reported moderate urinary symptoms including mild occasional dysuria, urgency, occasional hesitancy, weak stream, and nocturia x5. He also experienced mild fatigue and bowel movements alternating between constipation and diarrhea throughout his treatment but not requiring medical intervention.                              On review of systems, the patient states that he is doing very well overall.  He reports almost complete resolution of lower urinary tract symptoms over the past week and feels that he is almost back to his baseline at this point.  He continues with nocturia 2-3 times per night but otherwise feels that he is emptying his bladder well and is not having excessive daytime frequency, urgency, dysuria, gross hematuria or weak stream.  He denies any incontinence.  He reports a healthy appetite and is maintaining his weight.  He denies abdominal pain, nausea, vomiting, constipation or diarrhea at this point.  He was recently treated by his PCP for trichomoniasis and completed his prescription of metronidazole as prescribed.  His partner was treated as well.  He has continued with some mild penile itching/irritation but feels that this is gradually improving.  He denies  penile discharge.  ALLERGIES:  has No Known Allergies.  Meds: Current Outpatient Medications  Medication Sig Dispense Refill  . alfuzosin (UROXATRAL) 10 MG 24 hr tablet Take 10 mg by mouth 2 (two) times daily.   3  . amLODipine (NORVASC) 5 MG tablet TAKE 2 TABLETS BY MOUTH DAILY.----  takes in pm 90 tablet 1  . atorvastatin (LIPITOR) 20 MG tablet TAKE 1 TABLET BY MOUTH DAILY. (Patient taking differently: TAKE 1 TABLET BY MOUTH DAILY.  --- takes in pm) 90 tablet 1  . cetirizine (ZYRTEC) 10 MG tablet Take 10 mg by mouth at bedtime.    . dronabinol (MARINOL) 2.5 MG capsule Take 1 capsule (2.5 mg total) by mouth 2 (two) times daily before lunch and supper. 60 capsule 2  . esomeprazole (NEXIUM) 40 MG capsule TAKE 1 CAPSULE BY MOUTH DAILY BEFORE BREAKFAST. 90 capsule 1  . phenazopyridine (PYRIDIUM) 200 MG tablet Take 1 tablet (200 mg total) by mouth 3 (three) times daily as needed for pain. 45 tablet 4  . traMADol (ULTRAM) 50 MG tablet Take 1 tablet (50 mg total) by mouth every 6 (six) hours as needed. (Patient not taking: Reported on 04/29/2018) 15 tablet 0  . triamcinolone (NASACORT) 55 MCG/ACT AERO nasal inhaler Place 2 sprays into the nose daily. (Patient not taking: Reported on 04/29/2018) 1 Inhaler 12   No current facility-administered medications for this encounter.     Physical Findings:  height is 5\' 6"  (1.676 m)  and weight is 179 lb 12.8 oz (81.6 kg). His oral temperature is 98.2 F (36.8 C). His blood pressure is 124/71 and his pulse is 67. His respiration is 18 and oxygen saturation is 98%.  Pain Assessment Pain Score: 0-No pain/10 In general this is a well appearing African-American male in no acute distress.  He's alert and oriented x4 and appropriate throughout the examination. Cardiopulmonary assessment is negative for acute distress and he exhibits normal effort.   Lab Findings: Lab Results  Component Value Date   WBC 4.1 01/30/2017   HGB 13.3 02/23/2018   HCT 39.0 02/23/2018    MCV 89.6 01/30/2017   PLT 266 01/30/2017     Radiographic Findings: No results found.  Impression/Plan: 1.  83 y.o.gentleman with stage T2a adenocarcinoma of the prostate with Gleason score of 4+3, and PSA of 16.90.   He will continue to follow up with urology for ongoing PSA determinations and has an appointment scheduled with Dr. Alyson Ingles on 08/25/18. He understands what to expect with regards to PSA monitoring going forward. I will look forward to following his response to treatment via correspondence with urology, and would be happy to continue to participate in his care if clinically indicated. I talked to the patient about what to expect in the future, including his risk for erectile dysfunction and rectal bleeding. I encouraged him to call or return to the office if he has any questions regarding his previous radiation or possible radiation side effects. He was comfortable with this plan and will follow up as needed. 2. Trichomoniasis.  Recently completed treatment with metronidazole and partner treated as well.  He has mild residual penile itching/irritation which is gradually improving.  I advised for the patient to monitor symptoms and if these are progressively worsening or not completely resolving, consider a follow-up visit with his primary care provider for repeat urinalysis to rule out any residual trichomonas.    Nicholos Johns, PA-C

## 2018-06-16 MED FILL — SILDENAFIL CITRATE 100 MG T: 100 | 70 days supply | Qty: 10 | Fill #5

## 2018-06-17 ENCOUNTER — Other Ambulatory Visit: Payer: Self-pay | Admitting: Pharmacist

## 2018-06-17 NOTE — Patient Outreach (Signed)
[facility]) Care Management  06/17/2018  BENFORD ASCH 1935/07/14 [id number]   Incoming call from Newman Nip in response to the [facility] Medication Adherence Campaign. Speak with patient. HIPAA identifiers verified and verbal consent received.  Mr. Nickson reports that he takes atorvastatin 20 mg once daily as directed. Reports that he may miss doses of this medication occasionally. Counsel on the importance of medication adherence and about using a weekly pillbox to aid with adherence. Mr. taheem fricke understanding and states that he will get a weekly pillbox the next time that he goes to his pharmacy. Mr. Farruggia reports that he recently noticed that he is due for a refill of his atorvastatin and will call his pharmacy to have it filled.  Patient denies any further medication questions/concerns at this time and indicates that he needs to go.  Note that per Epic, patient last had the prescription refilled by [facility] for a 30 day supply. Note that patient has recently been going to [facility]. Call patient's [facility] and ask that a fax requesting a 90 day supply prescription be sent to the patient's provider.  PLAN  Will call to follow up on 06/19/18 to confirm that a new 90 day prescription was received by patient's [facility].  Harlow Asa, PharmD, [facility] [phone]

## 2018-06-18 ENCOUNTER — Other Ambulatory Visit: Payer: Self-pay | Admitting: Family Medicine

## 2018-06-19 ENCOUNTER — Other Ambulatory Visit: Payer: Self-pay | Admitting: Pharmacist

## 2018-06-19 ENCOUNTER — Other Ambulatory Visit: Payer: Self-pay | Admitting: Family Medicine

## 2018-06-19 ENCOUNTER — Ambulatory Visit: Payer: Self-pay | Admitting: Pharmacist

## 2018-06-19 NOTE — Telephone Encounter (Signed)
Please review for refill. Atorvastatin 20 mg   Pt seen at the [facility].   Pcp:  Kathe Becton

## 2018-06-19 NOTE — Patient Outreach (Signed)
[facility]) Care Management  06/19/2018  Jorge Martin 27-Nov-1934 [id number]   Call to follow up with patient's [place]. Confirm that a refill for patient's atorvastatin was called in and picked up. However, prescription was sent for a 30 day supply, rather than a 90 day supply. Request that pharmacy call to request a 90 day supply so that patient is able to receive a 90 day supply moving forward.  Will close pharmacy episode at this time.  Harlow Asa, PharmD, [facility] [phone]

## 2018-06-25 ENCOUNTER — Other Ambulatory Visit: Payer: Self-pay | Admitting: Family Medicine

## 2018-07-02 ENCOUNTER — Other Ambulatory Visit: Payer: Self-pay | Admitting: Family Medicine

## 2018-07-03 DIAGNOSIS — G4733 Obstructive sleep apnea (adult) (pediatric): Secondary | ICD-10-CM | POA: Diagnosis not present

## 2018-07-07 ENCOUNTER — Other Ambulatory Visit: Payer: Self-pay

## 2018-07-07 MED ORDER — AMLODIPINE BESYLATE 5 MG PO TABS: ORAL_TABLET | ORAL | 0 refills | Status: DC

## 2018-07-07 MED FILL — AMLODIPINE BESYLATE 5 MG TA: 5 | 30 days supply | Qty: 60 | Fill #0

## 2018-07-08 ENCOUNTER — Telehealth: Payer: Self-pay

## 2018-07-08 ENCOUNTER — Other Ambulatory Visit: Payer: Self-pay | Admitting: Family Medicine

## 2018-07-08 DIAGNOSIS — G4733 Obstructive sleep apnea (adult) (pediatric): Secondary | ICD-10-CM | POA: Diagnosis not present

## 2018-07-08 MED ORDER — AMLODIPINE BESYLATE 5 MG PO TABS: ORAL_TABLET | ORAL | 0 refills | Status: DC

## 2018-07-08 NOTE — Telephone Encounter (Signed)
Medication sent to [facility]

## 2018-07-09 ENCOUNTER — Other Ambulatory Visit: Payer: Self-pay

## 2018-07-09 ENCOUNTER — Telehealth: Payer: Self-pay

## 2018-07-09 MED ORDER — AMLODIPINE BESYLATE 5 MG PO TABS: ORAL_TABLET | ORAL | 0 refills | Status: DC

## 2018-07-09 NOTE — Telephone Encounter (Signed)
Tried to call pharmacy, could not get an answer. This has been sent again to [facility] today 07/09/2018. Thanks!

## 2018-07-14 ENCOUNTER — Telehealth: Payer: Self-pay

## 2018-07-14 NOTE — Telephone Encounter (Signed)
Called and spoke with patient. He will keep appointment on 07/15/2018. Thanks!

## 2018-07-15 ENCOUNTER — Encounter: Payer: Self-pay | Admitting: Family Medicine

## 2018-07-15 ENCOUNTER — Ambulatory Visit (INDEPENDENT_AMBULATORY_CARE_PROVIDER_SITE_OTHER): Payer: Medicare Other | Admitting: Family Medicine

## 2018-07-15 VITALS — BP 142/70 | HR 99 | Temp 97.9°F | Ht 66.0 in | Wt 185.2 lb

## 2018-07-15 DIAGNOSIS — R8290 Unspecified abnormal findings in urine: Secondary | ICD-10-CM

## 2018-07-15 DIAGNOSIS — Z09 Encounter for follow-up examination after completed treatment for conditions other than malignant neoplasm: Secondary | ICD-10-CM

## 2018-07-15 DIAGNOSIS — R300 Dysuria: Secondary | ICD-10-CM | POA: Diagnosis not present

## 2018-07-15 DIAGNOSIS — R3912 Poor urinary stream: Secondary | ICD-10-CM | POA: Diagnosis not present

## 2018-07-15 DIAGNOSIS — C61 Malignant neoplasm of prostate: Secondary | ICD-10-CM | POA: Diagnosis not present

## 2018-07-15 LAB — POCT URINALYSIS DIP (MANUAL ENTRY)
Bilirubin, UA: NEGATIVE
Glucose, UA: NEGATIVE mg/dL
Ketones, POC UA: NEGATIVE mg/dL
Leukocytes, UA: NEGATIVE
Nitrite, UA: NEGATIVE
Protein Ur, POC: 30 mg/dL — AB
Spec Grav, UA: 1.02 (ref 1.010–1.025)
Urobilinogen, UA: 0.2 E.U./dL
pH, UA: 5.5 (ref 5.0–8.0)

## 2018-07-15 MED ORDER — PHENAZOPYRIDINE HCL 200 MG PO TABS: 200.0000 mg | ORAL_TABLET | Freq: Three times a day (TID) | ORAL | 1 refills | Status: DC | PRN

## 2018-07-15 MED ORDER — ALFUZOSIN HCL ER 10 MG PO TB24: 10.0000 mg | ORAL_TABLET | Freq: Two times a day (BID) | ORAL | 3 refills | Status: DC

## 2018-07-15 NOTE — Progress Notes (Addendum)
Follow Up  Subjective:    Patient ID: Jorge Martin, male    DOB: 1935/06/30, 83 y.o.   MRN: [id number]   Chief Complaint  Patient presents with  . Dysuria  . Penis Pain    HPI  Jorge Martin is a 83 year old male with a past medical history of Weak Urinary Stream, Seasonal Allergic Rhinitis, Prostate Cancer, Pre-Diabetes, Sleep Apnea, Hypertension, Hypercholesteremia, History of Gout, History of Alcoholism, Headache, GERD, ED, and Arthritis. He is here today for follow up.    Current Status: Since his last office visit, he is doing well with no complaints.   Patient was diagnosed with Prostate Cancer 02/2018. He recently completed 40 radiation treatments, per Jorge Martin. He has been having increased penile pain and burning sensation, especially at night. He reports that he has experienced this pain during his radiation treatment, but pain has not subsided. No reports of GI problems such as nausea, vomiting, diarrhea, and constipation. He has no reports of blood in stools, and hematuria.   He denies fevers, chills, fatigue, recent infections, weight loss, and night sweats. He has not had any headaches, visual changes, dizziness, and falls. No chest pain, heart palpitations, cough and shortness of breath reported. No depression or anxiety reported.  Past Medical History:  Diagnosis Date  . Arthritis   . ED (erectile dysfunction)   . GERD (gastroesophageal reflux disease)   . Headache   . History of alcoholism ([place])    02-17-2018  per pt quit 1999  . History of gout 09/2011   right ankle  . Hypercholesteremia   . Hyperplasia of prostate with lower urinary tract symptoms (LUTS)   . Hypertension   . OSA on CPAP followed by Jorge Rexene Martin   study 11-25-2017 moderate to severe osa (AHI 28.4/hr,  REM AHI 38/hr)  . Pre-diabetes   . Prostate cancer [facility]) urologist-  Jorge Martin  onologist-  Jorge Tammi Martin   dx 11-18-2017  Stage T2a,  Gleason 4+3,  PSA 16.90,  vol 124cc-- treatment plan external  beam radiation  . Seasonal allergic rhinitis   . Weak urinary stream     Family History  Problem Relation Age of Onset  . Hyperlipidemia Mother   . Hyperlipidemia Father   . Cancer Maternal Aunt        stomach    Social History   Socioeconomic History  . Marital status: Widowed    Spouse name: Not on file  . Number of children: 8  . Years of education: 6th grade  . Highest education level: Not on file  Occupational History  . Not on file  Social Needs  . Financial resource strain: Not on file  . Food insecurity:    Worry: Not on file    Inability: Not on file  . Transportation needs:    Medical: Not on file    Non-medical: Not on file  Tobacco Use  . Smoking status: Never Smoker  . Smokeless tobacco: Never Used  Substance and Sexual Activity  . Alcohol use: Not Currently    Comment: hx heavy alcohol  -- quit 1999  . Drug use: No  . Sexual activity: Yes  Lifestyle  . Physical activity:    Days per week: Not on file    Minutes per session: Not on file  . Stress: Not on file  Relationships  . Social connections:    Talks on phone: Not on file    Gets together: Not on file  Attends religious service: Not on file    Active member of club or organization: Not on file    Attends meetings of clubs or organizations: Not on file    Relationship status: Not on file  . Intimate partner violence:    Fear of current or ex partner: No    Emotionally abused: No    Physically abused: No    Forced sexual activity: No  Other Topics Concern  . Not on file  Social History Narrative   He lives at home alone   Drinks 2 cups of coffee daily    Right handed    Past Surgical History:  Procedure Laterality Date  . GOLD SEED IMPLANT N/A 02/23/2018   Procedure: GOLD SEED IMPLANT;  Surgeon: Cleon Gustin, MD;  Location: [facility];  Service: Urology;  Laterality: N/A;  . NO PAST SURGERIES    . PROSTATE BIOPSY  11-18-2017  Jorge Martin  office  . SPACE  OAR INSTILLATION N/A 02/23/2018   Procedure: SPACE OAR INSTILLATION;  Surgeon: Cleon Gustin, MD;  Location[facility];  Service: Urology;  Laterality: N/A;    Immunization History  Administered Date(s) Administered  . Influenza,inj,Quad PF,6+ Mos 05/15/2017, 04/29/2018  . Influenza-Unspecified 11/29/2015  . Pneumococcal Conjugate-13 01/30/2017  . Tdap 01/30/2017    Current Meds  Medication Sig  . alfuzosin (UROXATRAL) 10 MG 24 hr tablet Take 1 tablet (10 mg total) by mouth 2 (two) times daily.  [name] amLODipine (NORVASC) 5 MG tablet TAKE 2 TABLETS BY MOUTH DAILY.----  takes in pm  . atorvastatin (LIPITOR) 20 MG tablet TAKE 1 TABLET BY MOUTH EVERY DAY  . cetirizine (ZYRTEC) 10 MG tablet Take 10 mg by mouth at bedtime.  . dronabinol (MARINOL) 2.5 MG capsule Take 1 capsule (2.5 mg total) by mouth 2 (two) times daily before lunch and supper.  . esomeprazole (NEXIUM) 40 MG capsule TAKE 1 CAPSULE BY MOUTH DAILY BEFORE BREAKFAST.  . [DISCONTINUED] alfuzosin (UROXATRAL) 10 MG 24 hr tablet Take 10 mg by mouth 2 (two) times daily.     No Known Allergies  BP (!) 142/70 (BP Location: Left Arm, Patient Position: Sitting, Cuff Size: Small)   Pulse 99   Temp 97.9 F (36.6 C) (Oral)   Ht 5\' 6"  (1.676 m)   Wt 185 lb 3.2 oz (84 kg)   SpO2 (!) 74%   BMI 29.89 kg/m   Review of Systems  Constitutional: Negative.   HENT: Negative.   Respiratory: Negative.   Cardiovascular: Negative.   Gastrointestinal: Positive for abdominal distention (Obese).  Genitourinary: Positive for dysuria, frequency, penile pain and urgency.  Musculoskeletal: Positive for arthralgias (Generalized).  Skin: Negative.   Neurological: Negative.   Psychiatric/Behavioral: Negative.    Objective:   Physical Exam  Constitutional: He is oriented to person, place, and time. He appears well-developed and well-nourished.  HENT:  Head: Normocephalic and atraumatic.  Eyes: Pupils are equal, round, and  reactive to light. EOM are normal.  Cardiovascular: Normal rate, regular rhythm, normal heart sounds and intact distal pulses.  Pulmonary/Chest: Effort normal and breath sounds normal.  Abdominal: Soft. Bowel sounds are normal.  Musculoskeletal: Normal range of motion.  Neurological: He is alert and oriented to person, place, and time.  Skin: Skin is warm and dry.  Psychiatric: He has a normal mood and affect. His behavior is normal. Judgment and thought content normal.  Nursing note and vitals reviewed.  Assessment & Plan:   1. Dysuria We  will refill Alfuzosin and Phenazopyridine today. Patient insurance will not pay for prescription strength Phenazopyridine. Patient to get OTC Phenazopyridine (Azotabs) 100 mg instead. We will contact insurance company to assess steps to approve Rx strength. Monitor.   Patient will continue following up with Urology as needed.  - POCT urinalysis dipstick - alfuzosin (UROXATRAL) 10 MG 24 hr tablet; Take 1 tablet (10 mg total) by mouth 2 (two) times daily.  Dispense: 60 tablet; Refill: 3 - phenazopyridine (PYRIDIUM) 200 MG tablet; Take 1 tablet (200 mg total) by mouth 3 (three) times daily as needed for pain.  Dispense: 90 tablet; Refill: 1  2. Abnormal urine - Urine Culture  3. Malignant neoplasm of prostate (HCC) - alfuzosin (UROXATRAL) 10 MG 24 hr tablet; Take 1 tablet (10 mg total) by mouth 2 (two) times daily.  Dispense: 60 tablet; Refill: 3 - phenazopyridine (PYRIDIUM) 200 MG tablet; Take 1 tablet (200 mg total) by mouth 3 (three) times daily as needed for pain.  Dispense: 90 tablet; Refill: 1  4. Weak urinary stream R/t diagnosis of Prostate Cancer.   5. Burning with urination  6. Follow up He will follow up in 6 months.  Meds ordered this encounter  Medications  . alfuzosin (UROXATRAL) 10 MG 24 hr tablet    Sig: Take 1 tablet (10 mg total) by mouth 2 (two) times daily.    Dispense:  60 tablet    Refill:  3  . phenazopyridine (PYRIDIUM)  200 MG tablet    Sig: Take 1 tablet (200 mg total) by mouth 3 (three) times daily as needed for pain.    Dispense:  90 tablet    Refill:  [place],  MSN, FNP-C [facility] [address] [place], [place] [address] [phone]

## 2018-07-15 NOTE — Patient Instructions (Addendum)
Benign Prostatic Hyperplasia Benign prostatic hyperplasia (BPH) is an enlarged prostate gland that is caused by the normal aging process and not by cancer. The prostate is a walnut-sized gland that is involved in the production of semen. It is located in front of the rectum and below the bladder. The bladder stores urine and the urethra is the tube that carries the urine out of the body. The prostate may get bigger as a man gets older. An enlarged prostate can press on the urethra. This can make it harder to pass urine. The build-up of urine in the bladder can cause infection. Back pressure and infection may progress to bladder damage and kidney (renal) failure. What are the causes? This condition is part of a normal aging process. However, not all men develop problems from this condition. If the prostate enlarges away from the urethra, urine flow will not be blocked. If it enlarges toward the urethra and compresses it, there will be problems passing urine. What increases the risk? This condition is more likely to develop in men over the age of 47 years. What are the signs or symptoms? Symptoms of this condition include:  Getting up often during the night to urinate.  Needing to urinate frequently during the day.  Difficulty starting urine flow.  Decrease in size and strength of your urine stream.  Leaking (dribbling) after urinating.  Inability to pass urine. This needs immediate treatment.  Inability to completely empty your bladder.  Pain when you pass urine. This is more common if there is also an infection.  Urinary tract infection (UTI).  How is this diagnosed? This condition is diagnosed based on your medical history, a physical exam, and your symptoms. Tests will also be done, such as:  A post-void bladder scan. This measures any amount of urine that may remain in your bladder after you finish urinating.  A digital rectal exam. In a rectal exam, your health care provider  checks your prostate by putting a lubricated, gloved finger into your rectum to feel the back of your prostate gland. This exam detects the size of your gland and any abnormal lumps or growths.  An exam of your urine (urinalysis).  A prostate specific antigen (PSA) screening. This is a blood test used to screen for prostate cancer.  An ultrasound. This test uses sound waves to electronically produce a picture of your prostate gland.  Your health care provider may refer you to a specialist in kidney and prostate diseases (urologist). How is this treated? Once symptoms begin, your health care provider will monitor your condition (active surveillance or watchful waiting). Treatment for this condition will depend on the severity of your condition. Treatment may include:  Observation and yearly exams. This may be the only treatment needed if your condition and symptoms are mild.  Medicines to relieve your symptoms, including: ? Medicines to shrink the prostate. ? Medicines to relax the muscle of the prostate.  Surgery in severe cases. Surgery may include: ? Prostatectomy. In this procedure, the prostate tissue is removed completely through an open incision or with a laparascope or robotics. ? Transurethral resection of the prostate (TURP). In this procedure, a tool is inserted through the opening at the tip of the penis (urethra). It is used to cut away tissue of the inner core of the prostate. The pieces are removed through the same opening of the penis. This removes the blockage. ? Transurethral incision (TUIP). In this procedure, small cuts are made in the prostate. This  lessens the prostate's pressure on the urethra. ? Transurethral microwave thermotherapy (TUMT). This procedure uses microwaves to create heat. The heat destroys and removes a small amount of prostate tissue. ? Transurethral needle ablation (TUNA). This procedure uses radio frequencies to destroy and remove a small amount of  prostate tissue. ? Interstitial laser coagulation (Alexander). This procedure uses a laser to destroy and remove a small amount of prostate tissue. ? Transurethral electrovaporization (TUVP). This procedure uses electrodes to destroy and remove a small amount of prostate tissue. ? Prostatic urethral lift. This procedure inserts an implant to push the lobes of the prostate away from the urethra.  Follow these instructions at home:  Take over-the-counter and prescription medicines only as told by your health care provider.  Monitor your symptoms for any changes. Contact your health care provider with any changes.  Avoid drinking large amounts of liquid before going to bed or out in public.  Avoid or reduce how much caffeine or alcohol you drink.  Give yourself time when you urinate.  Keep all follow-up visits as told by your health care provider. This is important. Contact a health care provider if:  You have unexplained back pain.  Your symptoms do not get better with treatment.  You develop side effects from the medicine you are taking.  Your urine becomes very dark or has a bad smell.  Your lower abdomen becomes distended and you have trouble passing your urine. Get help right away if:  You have a fever or chills.  You suddenly cannot urinate.  You feel lightheaded, or very dizzy, or you faint.  There are large amounts of blood or clots in the urine.  Your urinary problems become hard to manage.  You develop moderate to severe low back or flank pain. The flank is the side of your body between the ribs and the hip. These symptoms may represent a serious problem that is an emergency. Do not wait to see if the symptoms will go away. Get medical help right away. Call your local emergency services (911 in the [place] Do not drive yourself to the hospital. Summary  Benign prostatic hyperplasia (BPH) is an enlarged prostate that is caused by the normal aging process and not by  cancer.  An enlarged prostate can press on the urethra. This can make it hard to pass urine.  This condition is part of a normal aging process and is more likely to develop in men over the age of 53 years.  Get help right away if you suddenly cannot urinate. This information is not intended to replace advice given to you by your health care provider. Make sure you discuss any questions you have with your health care provider. Document Released: 09/02/2005 Document Revised: 10/07/2016 Document Reviewed: 10/07/2016 Elsevier Interactive Patient Education  2018 [place] tablets What is this medicine? PHENAZOPYRIDINE (fen az oh PEER i deen) is a pain reliever. It is used to stop the pain, burning, or discomfort caused by infection or irritation of the urinary tract. This medicine is not an antibiotic. It will not cure a urinary tract infection. This medicine may be used for other purposes; ask your health care provider or pharmacist if you have questions. COMMON BRAND NAME(S): AZO, Azo-100, Azo-Gesic, Azo-Septic, Azo-Standard, Phenazo, Prodium, Pyridium, Urinary Analgesic, Uristat What should I tell my health care provider before I take this medicine? They need to know if you have any of these conditions: -glucose-6-phosphate dehydrogenase (G6PD) deficiency -  kidney disease -an unusual or allergic reaction to phenazopyridine, other medicines, foods, dyes, or preservatives -pregnant or trying to get pregnant -breast-feeding How should I use this medicine? Take this medicine by mouth with a glass of water. Follow the directions on the prescription label. Take after meals. Take your doses at regular intervals. Do not take your medicine more often than directed. Do not skip doses or stop your medicine early even if you feel better. Do not stop taking except on your doctor's advice. Talk to your pediatrician regarding the use of this medicine in children. Special care  may be needed. Overdosage: If you think you have taken too much of this medicine contact a poison control center or emergency room at once. NOTE: This medicine is only for you. Do not share this medicine with others. What if I miss a dose? If you miss a dose, take it as soon as you can. If it is almost time for your next dose, take only that dose. Do not take double or extra doses. What may interact with this medicine? Interactions are not expected. This list may not describe all possible interactions. Give your health care provider a list of all the medicines, herbs, non-prescription drugs, or dietary supplements you use. Also tell them if you smoke, drink alcohol, or use illegal drugs. Some items may interact with your medicine. What should I watch for while using this medicine? Tell your doctor or health care professional if your symptoms do not improve or if they get worse. This medicine colors body fluids red. This effect is harmless and will go away after you are done taking the medicine. It will change urine to an dark orange or red color. The red color may stain clothing. Soft contact lenses may become permanently stained. It is best not to wear soft contact lenses while taking this medicine. If you are diabetic you may get a false positive result for sugar in your urine. Talk to your health care provider. What side effects may I notice from receiving this medicine? Side effects that you should report to your doctor or health care professional as soon as possible: -allergic reactions like skin rash, itching or hives, swelling of the face, lips, or tongue -blue or purple color of the skin -difficulty breathing -fever -less urine -unusual bleeding, bruising -unusual tired, weak -vomiting -yellowing of the eyes or skin Side effects that usually do not require medical attention (report to your doctor or health care professional if they continue or are bothersome): -dark  urine -headache -stomach upset This list may not describe all possible side effects. Call your doctor for medical advice about side effects. You may report side effects to FDA at 1-800-FDA-1088. Where should I keep my medicine? Keep out of the reach of children. Store at room temperature between 15 and 30 degrees C (59 and 86 degrees F). Protect from light and moisture. Throw away any unused medicine after the expiration date. NOTE: This sheet is a summary. It may not cover all possible information. If you have questions about this medicine, talk to your doctor, pharmacist, or health care provider.  2018 Elsevier/Gold Standard (2008-03-31 11:04:07)        Alfuzosin extended-release tablets What is this medicine? ALFUZOSIN (al FYOO zoe sin) is used to treat benign prostatic hyperplasia (BPH) in men. This is a condition that causes you to have an enlarged prostate. This medicine is not for use in women. This medicine may be used for other purposes; ask your  health care provider or pharmacist if you have questions. COMMON BRAND NAME(S): Uroxatral What should I tell my health care provider before I take this medicine? They need to know if you have any of the following conditions: -kidney or liver disease -low blood pressure -an unusual or allergic reaction to alfuzosin, other medicines, foods, dyes, or preservatives How should I use this medicine? Take this medicine by mouth with a glass of water. Follow the directions on the prescription label. Take this medicine after the same meal every day. This medicine should be taken just after eating food. Do not take on an empty stomach. Swallow whole. Do not cut, crush or chew this medicine. Take your doses at regular intervals. Do not take your medicine more often than directed. Do not stop taking except on the advice of your doctor or health care professional. Talk to your pediatrician regarding the use of this medicine in children. Special care  may be needed. Overdosage: If you think you have taken too much of this medicine contact a poison control center or emergency room at once. NOTE: This medicine is only for you. Do not share this medicine with others. What if I miss a dose? If you miss a dose, take it as soon as you can. If it is almost time for your next dose, take only that dose. Do not take double or extra doses. What may interact with this medicine? Do not take this medicine with any of the following medications: -certain medicines for fungal infections like fluconazole, itraconazole, ketoconazole, posaconazole, voriconazole -cisapride -dofetilide -dronedarone -droperidol -levomethadyl -other medicines for prostate problems -pimozide -ritonavir -thioridazine -ziprasidone This medicine may also interact with the following medications: -cimetidine -certain medicines for chest pain or blood pressure -other medicines that prolong the QT interval (cause an abnormal heart rhythm) -sildenafil -tadalafil -vardenafil This list may not describe all possible interactions. Give your health care provider a list of all the medicines, herbs, non-prescription drugs, or dietary supplements you use. Also tell them if you smoke, drink alcohol, or use illegal drugs. Some items may interact with your medicine. What should I watch for while using this medicine? Visit your doctor or health care professional for regular checks on your progress. Check your blood pressure regularly. Ask your doctor or health care professional what your blood pressure should be and when you should contact him or her. Drowsiness and dizziness are more likely to occur after the first dose, after an increase in dose, or during hot weather or exercise. These effects can decrease once your body adjusts to this medicine. Do not drive, use machinery, or do anything that needs mental alertness until you know how this drug affects you. Do not stand or sit up quickly,  especially if you are an older patient. This reduces the risk of dizzy or fainting spells. Alcohol can make you more drowsy and dizzy. Avoid alcoholic drinks. Contact your doctor or health care professional right away if you have an erection that lasts longer than 4 hours or if it becomes painful. This may be a sign of a serious problem and must be treated right away to prevent permanent damage. What side effects may I notice from receiving this medicine? Side effects that you should report to your doctor or health care professional as soon as possible: -allergic reactions like skin rash, itching or hives, swelling of the face, lips, or tongue -breathing problems -fast, irregular heartbeat -feeling faint or lightheaded, falls -prolonged or painful erection -swelling of ankles or  legs -unusually weak or tired -yellowing of eyes or skin Side effects that usually do not require medical attention (report to your doctor or health care professional if they continue or are bothersome): -constipation or diarrhea -difficulty sleeping -headache -nausea or upset stomach This list may not describe all possible side effects. Call your doctor for medical advice about side effects. You may report side effects to FDA at 1-800-FDA-1088. Where should I keep my medicine? Keep out of the reach of children. Store at room temperature between 15 and 30 degrees C (59 and 86 degrees F). Protect from light and moisture. Throw away any unused medicine after the expiration date. NOTE: This sheet is a summary. It may not cover all possible information. If you have questions about this medicine, talk to your doctor, pharmacist, or health care provider.  2018 Elsevier/Gold Standard (2013-03-25 11:01:41)

## 2018-07-17 ENCOUNTER — Telehealth: Payer: Self-pay

## 2018-07-17 LAB — URINE CULTURE

## 2018-07-17 NOTE — Telephone Encounter (Signed)
-----   Message from Azzie Glatter, [place] sent at 07/15/2018  5:00 PM EDT ----- Regarding: "Medication Approval" Mickel Baas,   Please contact patient's insurance company for possible approval of needed medication. Patient has diagnosis of Prostate Cancer. Please have them to fax necessary forms for approval to me if required.   Medication is: Phenazopyridine (Pyridium) 200 mg.  Insurance: [employer]  Help Line: [phone] Plan #: [id number] ID #: [id number]  Thank you.

## 2018-07-17 NOTE — Telephone Encounter (Signed)
Called and started prior authorization for pyridium. This has been sent to pharmacist to review under patients insurance and they will notify us via fax. Thanks!

## 2018-07-27 ENCOUNTER — Other Ambulatory Visit: Payer: Self-pay | Admitting: Family Medicine

## 2018-07-31 ENCOUNTER — Ambulatory Visit: Payer: Medicare Other | Admitting: Family Medicine

## 2018-08-07 DIAGNOSIS — H02034 Senile entropion of left upper eyelid: Secondary | ICD-10-CM | POA: Diagnosis not present

## 2018-08-07 DIAGNOSIS — H5010 Unspecified exotropia: Secondary | ICD-10-CM | POA: Diagnosis not present

## 2018-08-07 DIAGNOSIS — H02054 Trichiasis without entropian left upper eyelid: Secondary | ICD-10-CM | POA: Diagnosis not present

## 2018-08-07 DIAGNOSIS — H25813 Combined forms of age-related cataract, bilateral: Secondary | ICD-10-CM | POA: Diagnosis not present

## 2018-08-07 DIAGNOSIS — H401132 Primary open-angle glaucoma, bilateral, moderate stage: Secondary | ICD-10-CM | POA: Diagnosis not present

## 2018-08-08 DIAGNOSIS — G4733 Obstructive sleep apnea (adult) (pediatric): Secondary | ICD-10-CM | POA: Diagnosis not present

## 2018-08-19 ENCOUNTER — Other Ambulatory Visit: Payer: Self-pay

## 2018-08-19 MED ORDER — ATORVASTATIN CALCIUM 20 MG PO TABS: 20.0000 mg | ORAL_TABLET | Freq: Every day | ORAL | 1 refills | Status: DC

## 2018-08-19 NOTE — Telephone Encounter (Signed)
Medication sent to pharmacy  

## 2018-08-20 ENCOUNTER — Other Ambulatory Visit: Payer: Self-pay

## 2018-08-20 MED ORDER — ATORVASTATIN CALCIUM 20 MG PO TABS: 20.0000 mg | ORAL_TABLET | Freq: Every day | ORAL | 1 refills | Status: DC

## 2018-08-25 MED FILL — ALFUZOSIN HCL ER 10 MG TAB: 10 | 90 days supply | Qty: 180 | Fill #0

## 2018-08-28 MED FILL — SILDENAFIL CITRATE 100 MG T: 100 | 30 days supply | Qty: 20 | Fill #0

## 2018-09-21 ENCOUNTER — Other Ambulatory Visit: Payer: Self-pay | Admitting: Family Medicine

## 2018-09-22 ENCOUNTER — Other Ambulatory Visit: Payer: Self-pay | Admitting: Family Medicine

## 2018-09-23 MED FILL — SILDENAFIL CITRATE 100 MG T: 100 | 30 days supply | Qty: 20 | Fill #1

## 2018-10-21 ENCOUNTER — Ambulatory Visit: Payer: Medicare Other | Admitting: Adult Health

## 2018-10-21 DIAGNOSIS — G4733 Obstructive sleep apnea (adult) (pediatric): Secondary | ICD-10-CM | POA: Insufficient documentation

## 2018-10-21 DIAGNOSIS — Z9989 Dependence on other enabling machines and devices: Principal | ICD-10-CM

## 2018-10-21 NOTE — Progress Notes (Addendum)
PATIENT: Jorge Martin DOB: May 10, 1935  REASON FOR VISIT: follow up HISTORY FROM: patient  Chief Complaint  Patient presents with  . Sleep Apnea    Rm. 5.  Sts. he continues to tolerate CPAP well.  Only sleeps 3-4 hours per night, generally 11pm-2am. Sts. he has always slept very little./fim     HISTORY OF PRESENT ILLNESS: Today 10/28/18 Jorge Martin is a 84 y.o. male here today for follow up. He has history of daily headaches and OSA. He reports that he is feeling well. He is using cpap regularly and feels benefit. Download reports shows that he is using CPAP 28/30 nights for compliance of 93%. He is only using CPAP greater than 4 hours 8/30 nights for compliance of 27%. His AHI is 0.8 on 9cm H2O with EPR of 3. There is no significant leak. He reports that he rarely sleeps for more than 3-4 hours at night. He has BPH and is up multiple times at night. He does states that he naps frequently during the day.   HISTORY: (copied from Dr Guadelupe Sabin note on 04/01/2018) Today, 04/01/2018: I reviewed his CPAP compliance data from 03/01/2018 through 03/30/2018, which is a total of 30 days, during which time he used his CPAP 99 days with percent used days greater than 4 hours at 57%, indicating suboptimal compliance with an average usage of 4 hours and 27 minutes residual AHI at goal at 0.8 per hour, leak acceptable with the 95th percentile at 13.2 L/m on a pressure of 10 cm with EPR of 2. In the past 90 days his compliance for more than 4 hours has been 42%. In the very first month of starting CPAP therapy his compliance was better, in fact between 02/05/2018 and 03/06/2018 his compliance for more than 4 hours was 90% which is excellent. He reports doing well. CPAP is working well for him. He reports an improvement in his sleep quality and daytime sleepiness. His headaches are better as well. He has had some issues tolerating the pressure and had some change in his humidity setting as moisture was  accumulating in his nasal mask. Nevertheless, he struggles with nasal burning and has to take the mask off in the middle of the night, tries to put it back on later.  REVIEW OF SYSTEMS: Out of a complete 14 system review of symptoms, the patient complains only of the following symptoms, none, and all other reviewed systems are negative.  EPWORTH SLEEPINESS SCALE: 9   ALLERGIES: No Known Allergies  HOME MEDICATIONS: Outpatient Medications Prior to Visit  Medication Sig Dispense Refill  . alfuzosin (UROXATRAL) 10 MG 24 hr tablet Take 1 tablet (10 mg total) by mouth 2 (two) times daily. 60 tablet 3  . amLODipine (NORVASC) 5 MG tablet TAKE 2 TABLETS BY MOUTH DAILY.----  takes in pm 90 tablet 0  . atorvastatin (LIPITOR) 20 MG tablet Take 1 tablet (20 mg total) by mouth daily. 90 tablet 1  . esomeprazole (NEXIUM) 40 MG capsule TAKE 1 CAPSULE BY MOUTH DAILY BEFORE BREAKFAST. 90 capsule 1  . phenazopyridine (PYRIDIUM) 200 MG tablet Take 1 tablet (200 mg total) by mouth 3 (three) times daily as needed for pain. 90 tablet 1  . cetirizine (ZYRTEC) 10 MG tablet Take 10 mg by mouth at bedtime.    . dronabinol (MARINOL) 2.5 MG capsule Take 1 capsule (2.5 mg total) by mouth 2 (two) times daily before lunch and supper. (Patient not taking: Reported on 10/28/2018) 60 capsule  2  . esomeprazole (NEXIUM) 40 MG capsule TAKE 1 CAPSULE(40 MG) BY MOUTH DAILY BEFORE BREAKFAST 90 capsule 0  . traMADol (ULTRAM) 50 MG tablet Take 1 tablet (50 mg total) by mouth every 6 (six) hours as needed. (Patient not taking: Reported on 04/29/2018) 15 tablet 0  . triamcinolone (NASACORT) 55 MCG/ACT AERO nasal inhaler Place 2 sprays into the nose daily. (Patient not taking: Reported on 04/29/2018) 1 Inhaler 12   No facility-administered medications prior to visit.     PAST MEDICAL HISTORY: Past Medical History:  Diagnosis Date  . Arthritis   . ED (erectile dysfunction)   . GERD (gastroesophageal reflux disease)   . Headache     . History of alcoholism ([place])    02-17-2018  per pt quit 1999  . History of gout 09/2011   right ankle  . Hypercholesteremia   . Hyperplasia of prostate with lower urinary tract symptoms (LUTS)   . Hypertension   . OSA on CPAP followed by dr Rexene Alberts   study 11-25-2017 moderate to severe osa (AHI 28.4/hr,  REM AHI 38/hr)  . Pre-diabetes   . Prostate cancer [facility]) urologist-  dr Alyson Ingles  onologist-  dr Tammi Klippel   dx 11-18-2017  Stage T2a,  Gleason 4+3,  PSA 16.90,  vol 124cc-- treatment plan external beam radiation  . Seasonal allergic rhinitis   . Weak urinary stream     PAST SURGICAL HISTORY: Past Surgical History:  Procedure Laterality Date  . GOLD SEED IMPLANT N/A 02/23/2018   Procedure: GOLD SEED IMPLANT;  Surgeon: Cleon Gustin, MD;  Location: [facility];  Service: Urology;  Laterality: N/A;  . NO PAST SURGERIES    . PROSTATE BIOPSY  11-18-2017  dr Alyson Ingles  office  . SPACE OAR INSTILLATION N/A 02/23/2018   Procedure: SPACE OAR INSTILLATION;  Surgeon: Cleon Gustin, MD;  Location: [facility];  Service: Urology;  Laterality: N/A;    FAMILY HISTORY: Family History  Problem Relation Age of Onset  . Hyperlipidemia Mother   . Hyperlipidemia Father   . Cancer Maternal Aunt        stomach    SOCIAL HISTORY: Social History   Socioeconomic History  . Marital status: Widowed    Spouse name: Not on file  . Number of children: 8  . Years of education: 6th grade  . Highest education level: Not on file  Occupational History  . Not on file  Social Needs  . Financial resource strain: Not on file  . Food insecurity:    Worry: Not on file    Inability: Not on file  . Transportation needs:    Medical: Not on file    Non-medical: Not on file  Tobacco Use  . Smoking status: Never Smoker  . Smokeless tobacco: Never Used  Substance and Sexual Activity  . Alcohol use: Not Currently    Comment: hx heavy alcohol  -- quit 1999  . Drug  use: No  . Sexual activity: Yes  Lifestyle  . Physical activity:    Days per week: Not on file    Minutes per session: Not on file  . Stress: Not on file  Relationships  . Social connections:    Talks on phone: Not on file    Gets together: Not on file    Attends religious service: Not on file    Active member of club or organization: Not on file    Attends meetings of clubs or organizations: Not on  file    Relationship status: Not on file  . Intimate partner violence:    Fear of current or ex partner: No    Emotionally abused: No    Physically abused: No    Forced sexual activity: No  Other Topics Concern  . Not on file  Social History Narrative   He lives at home alone   Drinks 2 cups of coffee daily    Right handed    PHYSICAL EXAM  Vitals:   10/28/18 1308  BP: 130/72  Pulse: 68  Resp: 18  Weight: 190 lb 3.2 oz (86.3 kg)  Height: 5\' 6"  (1.676 m)   Body mass index is 30.7 kg/m.  Generalized: Well developed, in no acute distress  Cardiology: normal rate and rhythm, no murmur noted Respiratory: clear to auscultation bilaterally  Neurological examination  Mentation: Alert oriented to time, place, history taking. Follows all commands speech and language fluent Cranial nerve II-XII: Pupils were equal round reactive to light. Extraocular movements were full, visual field were full on confrontational test. Facial sensation and strength were normal. Uvula tongue midline. Head turning and shoulder shrug  were normal and symmetric. Motor: The motor testing reveals 5 over 5 strength of all 4 extremities. Good symmetric motor tone is noted throughout.  Sensory: Sensory testing is intact to soft touch on all 4 extremities. No evidence of extinction is noted.  Coordination: Cerebellar testing reveals good finger-nose-finger and heel-to-shin bilaterally.  Gait and station: Gait is normal. Tandem gait is normal. Romberg is negative. No drift is seen.  Reflexes: Deep tendon  reflexes are symmetric and normal bilaterally.   DIAGNOSTIC DATA (LABS, IMAGING, TESTING) - I reviewed patient records, labs, notes, testing and imaging myself where available.  No flowsheet data found.   Lab Results  Component Value Date   WBC 4.1 01/30/2017   HGB 13.3 02/23/2018   HCT 39.0 02/23/2018   MCV 89.6 01/30/2017   PLT 266 01/30/2017      Component Value Date/Time   NA 141 02/23/2018 1432   NA 143 09/02/2017 1015   K 3.9 02/23/2018 1432   CL 110 (H) 09/02/2017 1015   CO2 19 (L) 09/02/2017 1015   GLUCOSE 103 (H) 02/23/2018 1432   BUN 19 09/02/2017 1015   CREATININE 1.43 (H) 09/02/2017 1015   CREATININE 1.27 (H) 08/11/2017 1042   CALCIUM 9.2 09/02/2017 1015   PROT 7.0 08/11/2017 1042   ALBUMIN 4.6 01/30/2017 1006   AST 13 08/11/2017 1042   ALT 12 08/11/2017 1042   ALKPHOS 103 01/30/2017 1006   BILITOT 0.6 08/11/2017 1042   GFRNONAA 46 (L) 09/02/2017 1015   GFRNONAA 53 (L) 08/11/2017 1042   GFRAA 53 (L) 09/02/2017 1015   GFRAA 61 08/11/2017 1042   No results found for: CHOL, HDL, LDLCALC, LDLDIRECT, TRIG, CHOLHDL Lab Results  Component Value Date   HGBA1C 5.9 (A) 04/29/2018   No results found for: VITAMINB12 Lab Results  Component Value Date   TSH 1.25 01/30/2017     ASSESSMENT AND PLAN 84 y.o. year old male  has a past medical history of Arthritis, ED (erectile dysfunction), GERD (gastroesophageal reflux disease), Headache, History of alcoholism ([place]), History of gout (09/2011), Hypercholesteremia, Hyperplasia of prostate with lower urinary tract symptoms (LUTS), Hypertension, OSA on CPAP (followed by dr Rexene Alberts), Pre-diabetes, Prostate cancer [facility]) (urologist-  dr Alyson Ingles  onologist-  dr Tammi Klippel), Seasonal allergic rhinitis, and Weak urinary stream. here with     ICD-10-CM   1. OSA on CPAP  G47.33    Z99.89     We will continue current treatment plan. He is feeling better since starting CPAP. He was encouraged to continue nightly use of CPAP and try  to use for greater than 4 hours each night. I have advised to use with naps as well. He verbalizes understanding and will follow up in 1 year.    No orders of the defined types were placed in this encounter.    No orders of the defined types were placed in this encounter.     I spent 15 minutes with the patient. 50% of this time was spent counseling and educating patient on plan of care and medications.    Debbora Presto, FNP-C 10/28/2018, 1:34 PM [facility] [address] [place], [place] [address] [phone]  I reviewed the above note and documentation by the Nurse Practitioner and agree with the history, physical exam, assessment and plan as outlined above. I was immediately available for face-to-face consultation. Star Age, MD, PhD [facility])

## 2018-10-22 ENCOUNTER — Encounter: Payer: Self-pay | Admitting: Adult Health

## 2018-10-27 ENCOUNTER — Encounter: Payer: Self-pay | Admitting: Family Medicine

## 2018-10-28 ENCOUNTER — Other Ambulatory Visit: Payer: Self-pay

## 2018-10-28 ENCOUNTER — Ambulatory Visit (INDEPENDENT_AMBULATORY_CARE_PROVIDER_SITE_OTHER): Payer: Medicare Other | Admitting: Family Medicine

## 2018-10-28 ENCOUNTER — Encounter: Payer: Self-pay | Admitting: Family Medicine

## 2018-10-28 VITALS — BP 130/72 | HR 68 | Resp 18 | Ht 66.0 in | Wt 190.2 lb

## 2018-10-28 DIAGNOSIS — G4733 Obstructive sleep apnea (adult) (pediatric): Secondary | ICD-10-CM | POA: Diagnosis not present

## 2018-10-28 DIAGNOSIS — Z9989 Dependence on other enabling machines and devices: Secondary | ICD-10-CM

## 2018-10-28 NOTE — Patient Instructions (Signed)
Continue CPAP nightly Try to remember to use CPAP with naps as well.   CPAP and BPAP Information CPAP and BPAP are methods of helping a person breathe with the use of air pressure. CPAP stands for "continuous positive airway pressure." BPAP stands for "bi-level positive airway pressure." In both methods, air is blown through your nose or mouth and into your air passages to help you breathe well. CPAP and BPAP use different amounts of pressure to blow air. With CPAP, the amount of pressure stays the same while you breathe in and out. With BPAP, the amount of pressure is increased when you breathe in (inhale) so that you can take larger breaths. Your health care provider will recommend whether CPAP or BPAP would be more helpful for you. Why are CPAP and BPAP treatments used? CPAP or BPAP can be helpful if you have:  Sleep apnea.  Chronic obstructive pulmonary disease (COPD).  Heart failure.  Medical conditions that weaken the muscles of the chest including muscular dystrophy, or neurological diseases such as amyotrophic lateral sclerosis (ALS).  Other problems that cause breathing to be weak, abnormal, or difficult. CPAP is most commonly used for obstructive sleep apnea (OSA) to keep the airways from collapsing when the muscles relax during sleep. How is CPAP or BPAP administered? Both CPAP and BPAP are provided by a small machine with a flexible plastic tube that attaches to a plastic mask. You wear the mask. Air is blown through the mask into your nose or mouth. The amount of pressure that is used to blow the air can be adjusted on the machine. Your health care provider will determine the pressure setting that should be used based on your individual needs. When should CPAP or BPAP be used? In most cases, the mask only needs to be worn during sleep. Generally, the mask needs to be worn throughout the night and during any daytime naps. People with certain medical conditions may also need to wear  the mask at other times when they are awake. Follow instructions from your health care provider about when to use the machine. What are some tips for using the mask?   Because the mask needs to be snug, some people feel trapped or closed-in (claustrophobic) when first using the mask. If you feel this way, you may need to get used to the mask. One way to do this is by holding the mask loosely over your nose or mouth and then gradually applying the mask more snugly. You can also gradually increase the amount of time that you use the mask.  Masks are available in various types and sizes. Some fit over your mouth and nose while others fit over just your nose. If your mask does not fit well, talk with your health care provider about getting a different one.  If you are using a mask that fits over your nose and you tend to breathe through your mouth, a chin strap may be applied to help keep your mouth closed.  The CPAP and BPAP machines have alarms that may sound if the mask comes off or develops a leak.  If you have trouble with the mask, it is very important that you talk with your health care provider about finding a way to make the mask easier to tolerate. Do not stop using the mask. Stopping the use of the mask could have a negative impact on your health. What are some tips for using the machine?  Place your CPAP or BPAP machine  on a secure table or stand near an electrical outlet.  Know where the on/off switch is located on the machine.  Follow instructions from your health care provider about how to set the pressure on your machine and when you should use it.  Do not eat or drink while the CPAP or BPAP machine is on. Food or fluids could get pushed into your lungs by the pressure of the CPAP or BPAP.  Do not smoke. Tobacco smoke residue can damage the machine.  For home use, CPAP and BPAP machines can be rented or purchased through home health care companies. Many different brands of  machines are available. Renting a machine before purchasing may help you find out which particular machine works well for you.  Keep the CPAP or BPAP machine and attachments clean. Ask your health care provider for specific instructions. Get help right away if:  You have redness or open areas around your nose or mouth where the mask fits.  You have trouble using the CPAP or BPAP machine.  You cannot tolerate wearing the CPAP or BPAP mask.  You have pain, discomfort, and bloating in your abdomen. Summary  CPAP and BPAP are methods of helping a person breathe with the use of air pressure.  Both CPAP and BPAP are provided by a small machine with a flexible plastic tube that attaches to a plastic mask.  If you have trouble with the mask, it is very important that you talk with your health care provider about finding a way to make the mask easier to tolerate. This information is not intended to replace advice given to you by your health care provider. Make sure you discuss any questions you have with your health care provider. Document Released: 05/31/2004 Document Revised: 05/05/2018 Document Reviewed: 07/22/2016 Elsevier Interactive Patient Education  2019 [employer].

## 2018-11-07 ENCOUNTER — Other Ambulatory Visit: Payer: Self-pay | Admitting: Family Medicine

## 2019-01-15 ENCOUNTER — Ambulatory Visit: Payer: Medicare Other | Admitting: Family Medicine

## 2019-02-04 ENCOUNTER — Telehealth: Payer: Self-pay

## 2019-02-04 NOTE — Telephone Encounter (Signed)
Patient was negative for COV-19 screening questions. Patient also advise that he will need to wear a mask and also that we are not allowing visitors.

## 2019-02-05 ENCOUNTER — Ambulatory Visit (INDEPENDENT_AMBULATORY_CARE_PROVIDER_SITE_OTHER): Payer: Medicare Other | Admitting: Family Medicine

## 2019-02-05 ENCOUNTER — Other Ambulatory Visit: Payer: Self-pay

## 2019-02-05 DIAGNOSIS — C61 Malignant neoplasm of prostate: Secondary | ICD-10-CM | POA: Diagnosis not present

## 2019-02-05 DIAGNOSIS — R3912 Poor urinary stream: Secondary | ICD-10-CM

## 2019-02-05 DIAGNOSIS — I10 Essential (primary) hypertension: Secondary | ICD-10-CM

## 2019-02-05 DIAGNOSIS — Z09 Encounter for follow-up examination after completed treatment for conditions other than malignant neoplasm: Secondary | ICD-10-CM

## 2019-02-05 DIAGNOSIS — R300 Dysuria: Secondary | ICD-10-CM | POA: Diagnosis not present

## 2019-02-05 NOTE — Progress Notes (Signed)
Virtual Visit via Telephone Note  I connected with Jorge Martin on 02/05/19 at  3:00 PM EDT by telephone and verified that I am speaking with the correct person using two identifiers.   I discussed the limitations, risks, security and privacy concerns of performing an evaluation and management service by telephone and the availability of in person appointments. I also discussed with the patient that there may be a patient responsible charge related to this service. The patient expressed understanding and agreed to proceed.   History of Present Illness:  Past Medical History:  Diagnosis Date  . Arthritis   . ED (erectile dysfunction)   . GERD (gastroesophageal reflux disease)   . Headache   . History of alcoholism ([place])    02-17-2018  per pt quit 1999  . History of gout 09/2011   right ankle  . Hypercholesteremia   . Hyperplasia of prostate with lower urinary tract symptoms (LUTS)   . Hypertension   . OSA on CPAP followed by dr Rexene Alberts   study 11-25-2017 moderate to severe osa (AHI 28.4/hr,  REM AHI 38/hr)  . Pre-diabetes   . Prostate cancer [facility]) urologist-  dr Alyson Ingles  onologist-  dr Tammi Klippel   dx 11-18-2017  Stage T2a,  Gleason 4+3,  PSA 16.90,  vol 124cc-- treatment plan external beam radiation  . Seasonal allergic rhinitis   . Weak urinary stream     Current Outpatient Medications on File Prior to Visit  Medication Sig Dispense Refill  . alfuzosin (UROXATRAL) 10 MG 24 hr tablet Take 1 tablet (10 mg total) by mouth 2 (two) times daily. 60 tablet 3  . amLODipine (NORVASC) 5 MG tablet TAKE 2 TABLETS BY MOUTH DAILY.----  takes in pm 90 tablet 0  . atorvastatin (LIPITOR) 20 MG tablet Take 1 tablet (20 mg total) by mouth daily. 90 tablet 1  . esomeprazole (NEXIUM) 40 MG capsule TAKE 1 CAPSULE BY MOUTH DAILY BEFORE BREAKFAST. 90 capsule 1  . phenazopyridine (PYRIDIUM) 200 MG tablet Take 1 tablet (200 mg total) by mouth 3 (three) times daily as needed for pain. 90 tablet 1   No  current facility-administered medications on file prior to visit.     Current Status: Since his last office visit, he is doing well with no complaints. He denies visual changes, chest pain, cough, shortness of breath, heart palpitations, and falls. He has occasional headaches and dizziness with position changes. Denies severe headaches, confusion, seizures, double vision, and blurred vision, nausea and vomiting. He has chronic arthritis, which he takes OTC pain medications for relief. He is scheduled for Neurologist appointment on 02/09/2019.  He denies fevers, chills, fatigue, recent infections, weight loss, and night sweats. No reports of GI problems such as nausea, vomiting, diarrhea, and constipation. He has no reports of blood in stools, dysuria and hematuria. No depression or anxiety reported.   Observations/Objective:  Telephone Virtual Visit   Assessment and Plan:  1. Essential hypertension She will continue to decrease high sodium intake, excessive alcohol intake, increase potassium intake, smoking cessation, and increase physical activity of at least 30 minutes of cardio activity daily. She will continue to follow Heart Healthy or DASH diet.  2. Malignant neoplasm of prostate (HCC) Stable. He continues to follow up with Oncology as needed.   3. Dysuria  4. Weak urinary stream  No orders of the defined types were placed in this encounter.   No orders of the defined types were placed in this encounter.   Referral Orders  No referral(s) requested today    Kathe Becton,  MSN, FNP-C Patient [place] [address] [place], [place] [address] [phone]     Follow Up Instructions:  He will follow up in 6 months.     I discussed the assessment and treatment plan with the patient. The patient was provided an opportunity to ask questions and all were answered. The patient agreed with the plan and demonstrated an understanding of the  instructions.   The patient was advised to call back or seek an in-person evaluation if the symptoms worsen or if the condition fails to improve as anticipated.  I provided 15 minutes of non-face-to-face time during this encounter.   Azzie Glatter, FNP

## 2019-02-18 NOTE — Telephone Encounter (Signed)
Note not needed 

## 2019-08-05 ENCOUNTER — Other Ambulatory Visit: Payer: Self-pay | Admitting: Family Medicine

## 2019-08-09 ENCOUNTER — Ambulatory Visit: Payer: Medicare Other | Admitting: Family Medicine

## 2019-10-25 ENCOUNTER — Other Ambulatory Visit: Payer: Self-pay

## 2019-10-25 ENCOUNTER — Telehealth: Payer: Self-pay

## 2019-10-25 MED ORDER — ATORVASTATIN CALCIUM 20 MG PO TABS: ORAL_TABLET | ORAL | 0 refills | Status: DC

## 2019-10-25 NOTE — Telephone Encounter (Signed)
Patient requesting refill Atorvastatin 20 MG. If granted please send to [facility] on [address].

## 2019-10-27 ENCOUNTER — Other Ambulatory Visit: Payer: Self-pay | Admitting: Family Medicine

## 2019-11-03 ENCOUNTER — Ambulatory Visit: Payer: Medicare Other | Admitting: Family Medicine

## 2019-11-03 ENCOUNTER — Encounter: Payer: Self-pay | Admitting: Family Medicine

## 2019-11-03 ENCOUNTER — Other Ambulatory Visit: Payer: Self-pay

## 2019-11-03 VITALS — BP 137/76 | HR 63 | Temp 97.2°F | Ht 66.0 in | Wt 193.8 lb

## 2019-11-03 DIAGNOSIS — G4733 Obstructive sleep apnea (adult) (pediatric): Secondary | ICD-10-CM

## 2019-11-03 DIAGNOSIS — Z9989 Dependence on other enabling machines and devices: Secondary | ICD-10-CM

## 2019-11-03 DIAGNOSIS — R519 Headache, unspecified: Secondary | ICD-10-CM

## 2019-11-03 NOTE — Patient Instructions (Signed)
Please use CPAP nightly and greater than 4 hours each night   Follow up in 3 months  Sleep Apnea Sleep apnea affects breathing during sleep. It causes breathing to stop for a short time or to become shallow. It can also increase the risk of:  Heart attack.  Stroke.  Being very overweight (obese).  Diabetes.  Heart failure.  Irregular heartbeat. The goal of treatment is to help you breathe normally again. What are the causes? There are three kinds of sleep apnea:  Obstructive sleep apnea. This is caused by a blocked or collapsed airway.  Central sleep apnea. This happens when the brain does not send the right signals to the muscles that control breathing.  Mixed sleep apnea. This is a combination of obstructive and central sleep apnea. The most common cause of this condition is a collapsed or blocked airway. This can happen if:  Your throat muscles are too relaxed.  Your tongue and tonsils are too large.  You are overweight.  Your airway is too small. What increases the risk?  Being overweight.  Smoking.  Having a small airway.  Being older.  Being male.  Drinking alcohol.  Taking medicines to calm yourself (sedatives or tranquilizers).  Having family members with the condition. What are the signs or symptoms?  Trouble staying asleep.  Being sleepy or tired during the day.  Getting angry a lot.  Loud snoring.  Headaches in the morning.  Not being able to focus your mind (concentrate).  Forgetting things.  Less interest in sex.  Mood swings.  Personality changes.  Feelings of sadness (depression).  Waking up a lot during the night to pee (urinate).  Dry mouth.  Sore throat. How is this diagnosed?  Your medical history.  A physical exam.  A test that is done when you are sleeping (sleep study). The test is most often done in a sleep lab but may also be done at home. How is this treated?   Sleeping on your side.  Using a  medicine to get rid of mucus in your nose (decongestant).  Avoiding the use of alcohol, medicines to help you relax, or certain pain medicines (narcotics).  Losing weight, if needed.  Changing your diet.  Not smoking.  Using a machine to open your airway while you sleep, such as: ? An oral appliance. This is a mouthpiece that shifts your lower jaw forward. ? A CPAP device. This device blows air through a mask when you breathe out (exhale). ? An EPAP device. This has valves that you put in each nostril. ? A BPAP device. This device blows air through a mask when you breathe in (inhale) and breathe out.  Having surgery if other treatments do not work. It is important to get treatment for sleep apnea. Without treatment, it can lead to:  High blood pressure.  Coronary artery disease.  In men, not being able to have an erection (impotence).  Reduced thinking ability. Follow these instructions at home: Lifestyle  Make changes that your doctor recommends.  Eat a healthy diet.  Lose weight if needed.  Avoid alcohol, medicines to help you relax, and some pain medicines.  Do not use any products that contain nicotine or tobacco, such as cigarettes, e-cigarettes, and chewing tobacco. If you need help quitting, ask your doctor. General instructions  Take over-the-counter and prescription medicines only as told by your doctor.  If you were given a machine to use while you sleep, use it only as told by  your doctor.  If you are having surgery, make sure to tell your doctor you have sleep apnea. You may need to bring your device with you.  Keep all follow-up visits as told by your doctor. This is important. Contact a doctor if:  The machine that you were given to use during sleep bothers you or does not seem to be working.  You do not get better.  You get worse. Get help right away if:  Your chest hurts.  You have trouble breathing in enough air.  You have an uncomfortable  feeling in your back, arms, or stomach.  You have trouble talking.  One side of your body feels weak.  A part of your face is hanging down. These symptoms may be an emergency. Do not wait to see if the symptoms will go away. Get medical help right away. Call your local emergency services (911 in the [place] Do not drive yourself to the hospital. Summary  This condition affects breathing during sleep.  The most common cause is a collapsed or blocked airway.  The goal of treatment is to help you breathe normally while you sleep. This information is not intended to replace advice given to you by your health care provider. Make sure you discuss any questions you have with your health care provider. Document Revised: 06/19/2018 Document Reviewed: 04/28/2018 Elsevier Patient Education  [place].

## 2019-11-03 NOTE — Progress Notes (Addendum)
PATIENT: Jorge Martin DOB: 03-27-1935  REASON FOR VISIT: follow up HISTORY FROM: patient  Chief Complaint  Patient presents with  . Follow-up    Yearly f/u. Alone. Rm 1. No new concerns at this time.      HISTORY OF PRESENT ILLNESS: Today 11/03/19 Jorge Martin is a 85 y.o. male here today for follow up for OSA on CPAP. He reports that he is doing well overall. He does admit that he has not used CPAP consistently over the past few months.  He denies any difficulty with his machine.  He has all the supplies he needs.  He just states that there are days he does not want to use it.  He states that he does not usually sleep for 4 hours each night.  He does not nap during the day.  He feels that he just does not need much sleep.  He does continue to have persistent headaches.  He feels that overall, headaches have improved.  He does have about 2 mild headaches a week.  These are easily treated with Tylenol.  Compliance report dated 08/04/2019 through 11/01/2019 reveals that he used CPAP 25 of the last 90 days.  He used CPAP 15 of the last 90 days for greater than 4 hours for compliance of 17%.  Average usage on days used was 4 hours and 27 minutes.  Residual AHI was 1.5 on 9 cm of water and an EPR of 3.  There was no significant leak noted.   HISTORY: (copied from my note on 10/28/2018)  Jorge Martin is a 85 y.o. male here today for follow up. He has history of daily headaches and OSA. He reports that he is feeling well. He is using cpap regularly and feels benefit. Download reports shows that he is using CPAP 28/30 nights for compliance of 93%. He is only using CPAP greater than 4 hours 8/30 nights for compliance of 27%. His AHI is 0.8 on 9cm H2O with EPR of 3. There is no significant leak. He reports that he rarely sleeps for more than 3-4 hours at night. He has BPH and is up multiple times at night. He does states that he naps frequently during the day.   HISTORY: (copied from Dr  Guadelupe Sabin note on 04/01/2018)  Today, 04/01/2018: I reviewed his CPAP compliance data from 03/01/2018 through 03/30/2018, which is a total of 30 days, during which time he used his CPAP 99 days with percent used days greater than 4 hours at 57%, indicating suboptimal compliance with an average usage of 4 hours and 27 minutes residual AHI at goal at 0.8 per hour, leak acceptable with the 95thpercentile at 13.2 L/m on a pressure of 10 cm with EPR of 2. In the past 90 days his compliance for more than 4 hours has been 42%. In the very first month of starting CPAP therapy his compliance was better, in fact between 02/05/2018 and 03/06/2018 his compliance for more than 4 hours was 90% which is excellent. He reports doing well. CPAP is working well for him.He reports an improvement in his sleep quality and daytime sleepiness. His headaches are better as well. He has had some issues tolerating the pressure and had some change in his humidity setting as moisture was accumulating in his nasal mask. Nevertheless, he struggles with nasal burning and has to take the mask off in the middle of the night, triestoputit back onlater.   REVIEW OF SYSTEMS: Out of a complete  14 system review of symptoms, the patient complains only of the following symptoms, none and all other reviewed systems are negative.  Epworth Sleepiness Scale: 7  ALLERGIES: No Known Allergies  HOME MEDICATIONS: Outpatient Medications Prior to Visit  Medication Sig Dispense Refill  . alfuzosin (UROXATRAL) 10 MG 24 hr tablet Take 1 tablet (10 mg total) by mouth 2 (two) times daily. 60 tablet 3  . amLODipine (NORVASC) 5 MG tablet TAKE 2 TABLETS BY MOUTH DAILY.----  takes in pm 90 tablet 0  . atorvastatin (LIPITOR) 20 MG tablet TAKE 1 TABLET(20 MG) BY MOUTH DAILY 90 tablet 0  . esomeprazole (NEXIUM) 40 MG capsule TAKE 1 CAPSULE BY MOUTH DAILY BEFORE BREAKFAST. 90 capsule 1  . phenazopyridine (PYRIDIUM) 200 MG tablet Take 1 tablet (200 mg total)  by mouth 3 (three) times daily as needed for pain. 90 tablet 1   No facility-administered medications prior to visit.    PAST MEDICAL HISTORY: Past Medical History:  Diagnosis Date  . Arthritis   . ED (erectile dysfunction)   . GERD (gastroesophageal reflux disease)   . Headache   . History of alcoholism ([place])    02-17-2018  per pt quit 1999  . History of gout 09/2011   right ankle  . Hypercholesteremia   . Hyperplasia of prostate with lower urinary tract symptoms (LUTS)   . Hypertension   . OSA on CPAP followed by dr Rexene Alberts   study 11-25-2017 moderate to severe osa (AHI 28.4/hr,  REM AHI 38/hr)  . Pre-diabetes   . Prostate cancer [facility]) urologist-  dr Alyson Ingles  onologist-  dr Tammi Klippel   dx 11-18-2017  Stage T2a,  Gleason 4+3,  PSA 16.90,  vol 124cc-- treatment plan external beam radiation  . Seasonal allergic rhinitis   . Weak urinary stream     PAST SURGICAL HISTORY: Past Surgical History:  Procedure Laterality Date  . GOLD SEED IMPLANT N/A 02/23/2018   Procedure: GOLD SEED IMPLANT;  Surgeon: Cleon Gustin, MD;  Location: [facility];  Service: Urology;  Laterality: N/A;  . NO PAST SURGERIES    . PROSTATE BIOPSY  11-18-2017  dr Alyson Ingles  office  . SPACE OAR INSTILLATION N/A 02/23/2018   Procedure: SPACE OAR INSTILLATION;  Surgeon: Cleon Gustin, MD;  Location: [facility];  Service: Urology;  Laterality: N/A;    FAMILY HISTORY: Family History  Problem Relation Age of Onset  . Hyperlipidemia Mother   . Hyperlipidemia Father   . Cancer Maternal Aunt        stomach    SOCIAL HISTORY: Social History   Socioeconomic History  . Marital status: Widowed    Spouse name: Not on file  . Number of children: 8  . Years of education: 6th grade  . Highest education level: Not on file  Occupational History  . Not on file  Tobacco Use  . Smoking status: Never Smoker  . Smokeless tobacco: Never Used  Substance and Sexual Activity  .  Alcohol use: Not Currently    Comment: hx heavy alcohol  -- quit 1999  . Drug use: No  . Sexual activity: Yes  Other Topics Concern  . Not on file  Social History Narrative   He lives at home alone   Drinks 2 cups of coffee daily    Right handed   Social Determinants of Health   Financial Resource Strain:   . Difficulty of Paying Living Expenses: Not on file  Food Insecurity:   .  Worried About Charity fundraiser in the Last Year: Not on file  . Ran Out of Food in the Last Year: Not on file  Transportation Needs:   . Lack of Transportation (Medical): Not on file  . Lack of Transportation (Non-Medical): Not on file  Physical Activity:   . Days of Exercise per Week: Not on file  . Minutes of Exercise per Session: Not on file  Stress:   . Feeling of Stress : Not on file  Social Connections:   . Frequency of Communication with Friends and Family: Not on file  . Frequency of Social Gatherings with Friends and Family: Not on file  . Attends Religious Services: Not on file  . Active Member of Clubs or Organizations: Not on file  . Attends Archivist Meetings: Not on file  . Marital Status: Not on file  Intimate Partner Violence:   . Fear of Current or Ex-Partner: Not on file  . Emotionally Abused: Not on file  . Physically Abused: Not on file  . Sexually Abused: Not on file      PHYSICAL EXAM  Vitals:   11/03/19 1343  BP: 137/76  Pulse: 63  Temp: (!) 97.2 F (36.2 C)  TempSrc: Oral  Weight: 193 lb 12.8 oz (87.9 kg)  Height: 5\' 6"  (1.676 m)   Body mass index is 31.28 kg/m.  Generalized: Well developed, in no acute distress  Cardiology: normal rate and rhythm, no murmur noted Respiratory: Clear to auscultation bilaterally Neurological examination  Mentation: Alert oriented to time, place, history taking. Follows all commands speech and language fluent Cranial nerve II-XII: Pupils were equal round reactive to light. Extraocular movements were full, visual  field were full  Motor: The motor testing reveals 5 over 5 strength of all 4 extremities. Good symmetric motor tone is noted throughout. .  Gait and station: Gait is normal.    DIAGNOSTIC DATA (LABS, IMAGING, TESTING) - I reviewed patient records, labs, notes, testing and imaging myself where available.  No flowsheet data found.   Lab Results  Component Value Date   WBC 4.1 01/30/2017   HGB 13.3 02/23/2018   HCT 39.0 02/23/2018   MCV 89.6 01/30/2017   PLT 266 01/30/2017      Component Value Date/Time   NA 141 02/23/2018 1432   NA 143 09/02/2017 1015   K 3.9 02/23/2018 1432   CL 110 (H) 09/02/2017 1015   CO2 19 (L) 09/02/2017 1015   GLUCOSE 103 (H) 02/23/2018 1432   BUN 19 09/02/2017 1015   CREATININE 1.43 (H) 09/02/2017 1015   CREATININE 1.27 (H) 08/11/2017 1042   CALCIUM 9.2 09/02/2017 1015   PROT 7.0 08/11/2017 1042   ALBUMIN 4.6 01/30/2017 1006   AST 13 08/11/2017 1042   ALT 12 08/11/2017 1042   ALKPHOS 103 01/30/2017 1006   BILITOT 0.6 08/11/2017 1042   GFRNONAA 46 (L) 09/02/2017 1015   GFRNONAA 53 (L) 08/11/2017 1042   GFRAA 53 (L) 09/02/2017 1015   GFRAA 61 08/11/2017 1042   No results found for: CHOL, HDL, LDLCALC, LDLDIRECT, TRIG, CHOLHDL Lab Results  Component Value Date   HGBA1C 5.9 (A) 04/29/2018   No results found for: VITAMINB12 Lab Results  Component Value Date   TSH 1.25 01/30/2017       ASSESSMENT AND PLAN 85 y.o. year old male  has a past medical history of Arthritis, ED (erectile dysfunction), GERD (gastroesophageal reflux disease), Headache, History of alcoholism ([place]), History of gout (09/2011), Hypercholesteremia,  Hyperplasia of prostate with lower urinary tract symptoms (LUTS), Hypertension, OSA on CPAP (followed by dr Rexene Alberts), Pre-diabetes, Prostate cancer [facility]) (urologist-  dr Alyson Ingles  onologist-  dr Tammi Klippel), Seasonal allergic rhinitis, and Weak urinary stream. here with     ICD-10-CM   1. OSA on CPAP  G47.33    Z99.89   2.  Recurrent headache  R51.9     Mr. Schons feels that he is doing well overall.  He admits that he has not been as consistent with CPAP use this year.  He denies any difficulty with CPAP.  He does wish to continue therapy.  I have encouraged him to use CPAP consistently each night and greater than 4 hours each night.  He feels that headaches are well managed at this time.  We will continue to monitor.  Adequate hydration, healthy well-balanced diet and regular exercise advised.  He will follow-up with me in 3 months to review compliance.  He verbalizes understanding and agreement with this plan.   No orders of the defined types were placed in this encounter.    No orders of the defined types were placed in this encounter.     I spent 15 minutes with the patient. 50% of this time was spent counseling and educating patient on plan of care and medications.    Debbora Presto, FNP-C 11/03/2019, 2:21 PM [facility] [address] [place], [place] [address] [phone]  I reviewed the above note and documentation by the Nurse Practitioner and agree with the history, exam, assessment and plan as outlined above. I was available for consultation. Star Age, MD, PhD [facility])

## 2019-11-05 ENCOUNTER — Ambulatory Visit (INDEPENDENT_AMBULATORY_CARE_PROVIDER_SITE_OTHER): Payer: Medicare Other | Admitting: Family Medicine

## 2019-11-05 ENCOUNTER — Other Ambulatory Visit: Payer: Self-pay

## 2019-11-05 ENCOUNTER — Encounter: Payer: Self-pay | Admitting: Family Medicine

## 2019-11-05 VITALS — BP 133/74 | HR 61 | Temp 98.6°F | Ht 66.0 in | Wt 193.8 lb

## 2019-11-05 DIAGNOSIS — I10 Essential (primary) hypertension: Secondary | ICD-10-CM

## 2019-11-05 DIAGNOSIS — Z09 Encounter for follow-up examination after completed treatment for conditions other than malignant neoplasm: Secondary | ICD-10-CM

## 2019-11-05 DIAGNOSIS — R300 Dysuria: Secondary | ICD-10-CM

## 2019-11-05 DIAGNOSIS — R3912 Poor urinary stream: Secondary | ICD-10-CM | POA: Diagnosis not present

## 2019-11-05 DIAGNOSIS — Z0000 Encounter for general adult medical examination without abnormal findings: Secondary | ICD-10-CM | POA: Diagnosis not present

## 2019-11-05 LAB — POCT GLYCOSYLATED HEMOGLOBIN (HGB A1C): Hemoglobin A1C: 6.1 % — AB (ref 4.0–5.6)

## 2019-11-05 LAB — GLUCOSE, POCT (MANUAL RESULT ENTRY): POC Glucose: 111 mg/dl — AB (ref 70–99)

## 2019-11-05 NOTE — Progress Notes (Signed)
[facility]   Established Patient Office Visit  Subjective:  Patient ID: Jorge Martin, male    DOB: October 14, 1934  Age: 85 y.o. MRN: [id number]  CC:  Chief Complaint  Patient presents with  . Follow-up    HTN    HPI [name] is a 85 year old male who presents for Follow Up today.   Past Medical History:  Diagnosis Date  . Arthritis   . ED (erectile dysfunction)   . GERD (gastroesophageal reflux disease)   . Headache   . History of alcoholism ([place])    02-17-2018  per pt quit 1999  . History of gout 09/2011   right ankle  . Hypercholesteremia   . Hyperplasia of prostate with lower urinary tract symptoms (LUTS)   . Hypertension   . OSA on CPAP followed by dr Rexene Alberts   study 11-25-2017 moderate to severe osa (AHI 28.4/hr,  REM AHI 38/hr)  . Pre-diabetes   . Prostate cancer [facility]) urologist-  dr Alyson Ingles  onologist-  dr Tammi Klippel   dx 11-18-2017  Stage T2a,  Gleason 4+3,  PSA 16.90,  vol 124cc-- treatment plan external beam radiation  . Seasonal allergic rhinitis   . Weak urinary stream    Current Status: Since his last office visit, he is doing well with no complaints.  He states that he has noticed that he has had several stumbling episodes occasionally. He continues to follow up with Dr. Katy Fitch for Glaucoma, which he uses eye drops daily. He recently received 1st dose of COVID19 injection. He denies fevers, chills, fatigue, recent infections, weight loss, and night sweats. He has not had any headaches, visual changes, dizziness, and falls. No chest pain, heart palpitations, cough and shortness of breath reported. No reports of GI problems such as nausea, vomiting, diarrhea, and constipation. He has no reports of blood in stools, dysuria and hematuria. No depression or anxiety reported today. He denies suicidal ideations, homicidal ideations, or auditory hallucinations. He denies pain today.   Past Surgical History:   Procedure Laterality Date  . GOLD SEED IMPLANT N/A 02/23/2018   Procedure: GOLD SEED IMPLANT;  Surgeon: Cleon Gustin, MD;  Location[facility];  Service: Urology;  Laterality: N/A;  . NO PAST SURGERIES    . PROSTATE BIOPSY  11-18-2017  dr Alyson Ingles  office  . SPACE OAR INSTILLATION N/A 02/23/2018   Procedure: SPACE OAR INSTILLATION;  Surgeon: Cleon Gustin, MD;  Location: [facility];  Service: Urology;  Laterality: N/A;    Family History  Problem Relation Age of Onset  . Hyperlipidemia Mother   . Hyperlipidemia Father   . Cancer Maternal Aunt        stomach    Social History   Socioeconomic History  . Marital status: Widowed    Spouse name: Not on file  . Number of children: 8  . Years of education: 6th grade  . Highest education level: Not on file  Occupational History  . Not on file  Tobacco Use  . Smoking status: Never Smoker  . Smokeless tobacco: Never Used  Substance and Sexual Activity  . Alcohol use: Not Currently    Comment: hx heavy alcohol  -- quit 1999  . Drug use: No  . Sexual activity: Yes  Other Topics Concern  . Not on file  Social History Narrative   He lives at home alone   Drinks 2 cups of coffee daily  Right handed   Social Determinants of Health   Financial Resource Strain:   . Difficulty of Paying Living Expenses: Not on file  Food Insecurity:   . Worried About Charity fundraiser in the Last Year: Not on file  . Ran Out of Food in the Last Year: Not on file  Transportation Needs:   . Lack of Transportation (Medical): Not on file  . Lack of Transportation (Non-Medical): Not on file  Physical Activity:   . Days of Exercise per Week: Not on file  . Minutes of Exercise per Session: Not on file  Stress:   . Feeling of Stress : Not on file  Social Connections:   . Frequency of Communication with Friends and Family: Not on file  . Frequency of Social Gatherings with Friends and Family: Not on file   . Attends Religious Services: Not on file  . Active Member of Clubs or Organizations: Not on file  . Attends Archivist Meetings: Not on file  . Marital Status: Not on file  Intimate Partner Violence:   . Fear of Current or Ex-Partner: Not on file  . Emotionally Abused: Not on file  . Physically Abused: Not on file  . Sexually Abused: Not on file    Outpatient Medications Prior to Visit  Medication Sig Dispense Refill  . alfuzosin (UROXATRAL) 10 MG 24 hr tablet Take 1 tablet (10 mg total) by mouth 2 (two) times daily. 60 tablet 3  . amLODipine (NORVASC) 5 MG tablet TAKE 2 TABLETS BY MOUTH DAILY.----  takes in pm 90 tablet 0  . atorvastatin (LIPITOR) 20 MG tablet TAKE 1 TABLET(20 MG) BY MOUTH DAILY 90 tablet 0  . esomeprazole (NEXIUM) 40 MG capsule TAKE 1 CAPSULE BY MOUTH DAILY BEFORE BREAKFAST. 90 capsule 1  . phenazopyridine (PYRIDIUM) 200 MG tablet Take 1 tablet (200 mg total) by mouth 3 (three) times daily as needed for pain. 90 tablet 1   No facility-administered medications prior to visit.    No Known Allergies  ROS Review of Systems  Constitutional: Negative.   HENT: Negative.   Eyes: Negative.   Respiratory: Negative.   Cardiovascular: Negative.   Gastrointestinal: Negative.   Endocrine: Negative.   Genitourinary: Positive for frequency and urgency.  Musculoskeletal: Positive for arthralgias (generalized).  Skin: Negative.   Allergic/Immunologic: Negative.   Neurological: Negative.   Hematological: Negative.   Psychiatric/Behavioral: Negative.       Objective:    Physical Exam  Constitutional: He is oriented to person, place, and time. He appears well-developed and well-nourished.  HENT:  Head: Normocephalic and atraumatic.  Eyes: Conjunctivae are normal.  Cardiovascular: Normal rate, regular rhythm, normal heart sounds and intact distal pulses.  Pulmonary/Chest: Effort normal and breath sounds normal.  Abdominal: Soft. Bowel sounds are normal.   Musculoskeletal:        General: Normal range of motion.     Cervical back: Normal range of motion and neck supple.  Neurological: He is alert and oriented to person, place, and time. He has normal reflexes.  Skin: Skin is warm and dry.  Psychiatric: He has a normal mood and affect. His behavior is normal. Judgment and thought content normal.  Nursing note and vitals reviewed.   BP 133/74   Pulse 61   Temp 98.6 F (37 C) (Oral)   Ht 5\' 6"  (1.676 m)   Wt 193 lb 12.8 oz (87.9 kg)   SpO2 98%   BMI 31.28 kg/m  Wt Readings from  Last 3 Encounters:  11/05/19 193 lb 12.8 oz (87.9 kg)  11/03/19 193 lb 12.8 oz (87.9 kg)  10/28/18 190 lb 3.2 oz (86.3 kg)     Health Maintenance Due  Topic Date Due  . PNA vac Low Risk Adult (2 of 2 - PPSV23) 01/30/2018  . INFLUENZA VACCINE  04/17/2019    There are no preventive care reminders to display for this patient.  Lab Results  Component Value Date   TSH 1.960 11/05/2019   Lab Results  Component Value Date   WBC 3.3 (L) 11/05/2019   HGB 11.4 (L) 11/05/2019   HCT 34.8 (L) 11/05/2019   MCV 93 11/05/2019   PLT 213 11/05/2019   Lab Results  Component Value Date   NA 140 11/05/2019   K 4.4 11/05/2019   CO2 18 (L) 11/05/2019   GLUCOSE 115 (H) 11/05/2019   BUN 19 11/05/2019   CREATININE 1.51 (H) 11/05/2019   BILITOT 0.4 11/05/2019   ALKPHOS 102 11/05/2019   AST 15 11/05/2019   ALT 18 11/05/2019   PROT 6.6 11/05/2019   ALBUMIN 4.3 11/05/2019   CALCIUM 8.8 11/05/2019   ANIONGAP 11 07/25/2014   Lab Results  Component Value Date   CHOL 124 11/05/2019   Lab Results  Component Value Date   HDL 50 11/05/2019   Lab Results  Component Value Date   LDLCALC 45 11/05/2019   Lab Results  Component Value Date   TRIG 175 (H) 11/05/2019   Lab Results  Component Value Date   CHOLHDL 2.5 11/05/2019   Lab Results  Component Value Date   HGBA1C 6.1 (A) 11/05/2019      Assessment & Plan:   1. Essential hypertension The  current medical regimen is effective; blood pressure is stable at 133/74 today; continue present plan and medications as prescribed. He will continue to take medications as prescribed, to decrease high sodium intake, excessive alcohol intake, increase potassium intake, smoking cessation, and increase physical activity of at least 30 minutes of cardio activity daily. He will continue to follow Heart Healthy or DASH diet. - CBC with Differential - Comprehensive metabolic panel - Lipid Panel - TSH - PSA - Vitamin B12 - Vitamin D, 25-hydroxy  2. Weak urinary stream  3. Burning with urination He will continue OTC Azotabs for relief of symptoms.   4. Health care maintenance - POCT urinalysis dipstick - POCT glucose (manual entry) - POCT glycosylated hemoglobin (Hb A1C)  5. Follow up He will follow up in 6 months.   No orders of the defined types were placed in this encounter.    Orders Placed This Encounter  Procedures  . CBC with Differential  . Comprehensive metabolic panel  . Lipid Panel  . TSH  . PSA  . Vitamin B12  . Vitamin D, 25-hydroxy  . POCT urinalysis dipstick  . POCT glucose (manual entry)  . POCT glycosylated hemoglobin (Hb A1C)    Referral Orders  No referral(s) requested today    Kathe Becton,  MSN, FNP-BC [place] [address] [place], [place] [address] [phone] fax  Problem List Items Addressed This Visit      Cardiovascular and Mediastinum   HTN (hypertension) - Primary   Relevant Orders   CBC with Differential (Completed)   Comprehensive metabolic panel (Completed)   Lipid Panel (Completed)   TSH (Completed)   PSA (Completed)   Vitamin B12 (Completed)   Vitamin D, 25-hydroxy (  Completed)    Other Visit Diagnoses    Weak urinary stream       Burning with urination       Health care maintenance       Relevant Orders   POCT urinalysis dipstick    POCT glucose (manual entry) (Completed)   POCT glycosylated hemoglobin (Hb A1C) (Completed)   Follow up          No orders of the defined types were placed in this encounter.   Follow-up: Return in about 6 months (around 05/04/2020).    Azzie Glatter, FNP

## 2019-11-06 LAB — COMPREHENSIVE METABOLIC PANEL
ALT: 18 IU/L (ref 0–44)
AST: 15 IU/L (ref 0–40)
Albumin/Globulin Ratio: 1.9 (ref 1.2–2.2)
Albumin: 4.3 g/dL (ref 3.6–4.6)
Alkaline Phosphatase: 102 IU/L (ref 39–117)
BUN/Creatinine Ratio: 13 (ref 10–24)
BUN: 19 mg/dL (ref 8–27)
Bilirubin Total: 0.4 mg/dL (ref 0.0–1.2)
CO2: 18 mmol/L — ABNORMAL LOW (ref 20–29)
Calcium: 8.8 mg/dL (ref 8.6–10.2)
Chloride: 106 mmol/L (ref 96–106)
Creatinine, Ser: 1.51 mg/dL — ABNORMAL HIGH (ref 0.76–1.27)
GFR calc Af Amer: 49 mL/min/{1.73_m2} — ABNORMAL LOW (ref >59)
GFR calc non Af Amer: 42 mL/min/{1.73_m2} — ABNORMAL LOW (ref >59)
Globulin, Total: 2.3 g/dL (ref 1.5–4.5)
Glucose: 115 mg/dL — ABNORMAL HIGH (ref 65–99)
Potassium: 4.4 mmol/L (ref 3.5–5.2)
Sodium: 140 mmol/L (ref 134–144)
Total Protein: 6.6 g/dL (ref 6.0–8.5)

## 2019-11-06 LAB — CBC WITH DIFFERENTIAL/PLATELET
Basophils Absolute: 0 10*3/uL (ref 0.0–0.2)
Basos: 1 %
EOS (ABSOLUTE): 0.3 10*3/uL (ref 0.0–0.4)
Eos: 9 %
Hematocrit: 34.8 % — ABNORMAL LOW (ref 37.5–51.0)
Hemoglobin: 11.4 g/dL — ABNORMAL LOW (ref 13.0–17.7)
Immature Grans (Abs): 0 10*3/uL (ref 0.0–0.1)
Immature Granulocytes: 0 %
Lymphocytes Absolute: 1 10*3/uL (ref 0.7–3.1)
Lymphs: 31 %
MCH: 30.6 pg (ref 26.6–33.0)
MCHC: 32.8 g/dL (ref 31.5–35.7)
MCV: 93 fL (ref 79–97)
Monocytes Absolute: 0.4 10*3/uL (ref 0.1–0.9)
Monocytes: 12 %
Neutrophils Absolute: 1.5 10*3/uL (ref 1.4–7.0)
Neutrophils: 47 %
Platelets: 213 10*3/uL (ref 150–450)
RBC: 3.73 x10E6/uL — ABNORMAL LOW (ref 4.14–5.80)
RDW: 15 % (ref 11.6–15.4)
WBC: 3.3 10*3/uL — ABNORMAL LOW (ref 3.4–10.8)

## 2019-11-06 LAB — PSA: Prostate Specific Ag, Serum: 0.9 ng/mL (ref 0.0–4.0)

## 2019-11-06 LAB — LIPID PANEL
Chol/HDL Ratio: 2.5 ratio (ref 0.0–5.0)
Cholesterol, Total: 124 mg/dL (ref 100–199)
HDL: 50 mg/dL (ref >39)
LDL Chol Calc (NIH): 45 mg/dL (ref 0–99)
Triglycerides: 175 mg/dL — ABNORMAL HIGH (ref 0–149)
VLDL Cholesterol Cal: 29 mg/dL (ref 5–40)

## 2019-11-06 LAB — VITAMIN D 25 HYDROXY (VIT D DEFICIENCY, FRACTURES): Vit D, 25-Hydroxy: 20.3 ng/mL — ABNORMAL LOW (ref 30.0–100.0)

## 2019-11-06 LAB — TSH: TSH: 1.96 u[IU]/mL (ref 0.450–4.500)

## 2019-11-06 LAB — VITAMIN B12: Vitamin B-12: 1161 pg/mL (ref 232–1245)

## 2019-11-07 DIAGNOSIS — R300 Dysuria: Secondary | ICD-10-CM | POA: Insufficient documentation

## 2019-11-07 DIAGNOSIS — R3912 Poor urinary stream: Secondary | ICD-10-CM | POA: Insufficient documentation

## 2019-11-22 ENCOUNTER — Encounter: Payer: Self-pay | Admitting: Family Medicine

## 2019-11-22 ENCOUNTER — Other Ambulatory Visit: Payer: Self-pay | Admitting: Family Medicine

## 2019-11-22 DIAGNOSIS — E559 Vitamin D deficiency, unspecified: Secondary | ICD-10-CM

## 2019-11-22 MED ORDER — VITAMIN D (ERGOCALCIFEROL) 1.25 MG (50000 UNIT) PO CAPS: 50000.0000 [IU] | ORAL_CAPSULE | ORAL | 6 refills | Status: DC

## 2020-01-14 ENCOUNTER — Ambulatory Visit (INDEPENDENT_AMBULATORY_CARE_PROVIDER_SITE_OTHER): Payer: Medicare Other | Admitting: Family Medicine

## 2020-01-14 ENCOUNTER — Encounter: Payer: Self-pay | Admitting: Family Medicine

## 2020-01-14 ENCOUNTER — Other Ambulatory Visit: Payer: Self-pay

## 2020-01-14 VITALS — BP 140/73 | HR 83 | Temp 97.7°F | Ht 66.0 in | Wt 190.2 lb

## 2020-01-14 DIAGNOSIS — M25559 Pain in unspecified hip: Secondary | ICD-10-CM | POA: Diagnosis not present

## 2020-01-14 DIAGNOSIS — E559 Vitamin D deficiency, unspecified: Secondary | ICD-10-CM

## 2020-01-14 DIAGNOSIS — R300 Dysuria: Secondary | ICD-10-CM

## 2020-01-14 DIAGNOSIS — Z8546 Personal history of malignant neoplasm of prostate: Secondary | ICD-10-CM

## 2020-01-14 DIAGNOSIS — I10 Essential (primary) hypertension: Secondary | ICD-10-CM

## 2020-01-14 DIAGNOSIS — R102 Pelvic and perineal pain unspecified side: Secondary | ICD-10-CM | POA: Insufficient documentation

## 2020-01-14 DIAGNOSIS — M25551 Pain in right hip: Secondary | ICD-10-CM | POA: Diagnosis not present

## 2020-01-14 DIAGNOSIS — M25552 Pain in left hip: Secondary | ICD-10-CM

## 2020-01-14 DIAGNOSIS — R3912 Poor urinary stream: Secondary | ICD-10-CM

## 2020-01-14 DIAGNOSIS — Z09 Encounter for follow-up examination after completed treatment for conditions other than malignant neoplasm: Secondary | ICD-10-CM

## 2020-01-14 LAB — POCT URINALYSIS DIPSTICK
Bilirubin, UA: NEGATIVE
Blood, UA: NEGATIVE
Glucose, UA: NEGATIVE
Leukocytes, UA: NEGATIVE
Nitrite, UA: POSITIVE
Protein, UA: POSITIVE — AB
Spec Grav, UA: 1.025 (ref 1.010–1.025)
Urobilinogen, UA: 1 E.U./dL
pH, UA: 5.5 (ref 5.0–8.0)

## 2020-01-14 NOTE — Progress Notes (Signed)
Patient [facility]    Established Patient Office Visit  Subjective:  Patient ID: Jorge Martin, male    DOB: [birth year]  Age: 85 y.o. MRN: [id number]  CC:  Chief Complaint  Patient presents with  . Follow-up    HTN    HPI Jorge Martin is a 85 year old male who presents for Follow Up today.   Past Medical History:  Diagnosis Date  . Arthritis   . ED (erectile dysfunction)   . GERD (gastroesophageal reflux disease)   . Headache   . History of alcoholism ([place])    02-17-2018  per pt quit 1999  . History of gout 09/2011   right ankle  . Hypercholesteremia   . Hyperplasia of prostate with lower urinary tract symptoms (LUTS)   . Hypertension   . OSA on CPAP followed by dr Rexene Alberts   study 11-25-2017 moderate to severe osa (AHI 28.4/hr,  REM AHI 38/hr)  . Pre-diabetes   . Prostate cancer [facility]) urologist-  dr Alyson Ingles  onologist-  dr Tammi Klippel   dx 11-18-2017  Stage T2a,  Gleason 4+3,  PSA 16.90,  vol 124cc-- treatment plan external beam radiation  . Seasonal allergic rhinitis   . Weak urinary stream     Current Status: Since his last office visit, he is doing well with no complaints. He continues to follow up with Urology and Oncology as needed. He denies visual changes, chest pain, cough, shortness of breath, heart palpitations, and falls. He has occasional headaches and dizziness with position changes. Denies severe headaches, confusion, seizures, double vision, and blurred vision, nausea and vomiting. He denies fevers, chills, fatigue, recent infections, weight loss, and night sweats. Denies GI problems such as nausea, vomiting, diarrhea, and constipation. He has no reports of blood in stools, dysuria and hematuria. No depression or anxiety reported today. He denies suicidal ideations, homicidal ideations, or auditory hallucinations. He is taking all medications as prescribed. He denies pain today.   Past Surgical History:  Procedure  Laterality Date  . GOLD SEED IMPLANT N/A 02/23/2018   Procedure: GOLD SEED IMPLANT;  Surgeon: Cleon Gustin, MD;  Location: [facility];  Service: Urology;  Laterality: N/A;  . NO PAST SURGERIES    . PROSTATE BIOPSY  11-18-2017  dr Alyson Ingles  office  . SPACE OAR INSTILLATION N/A 02/23/2018   Procedure: SPACE OAR INSTILLATION;  Surgeon: Cleon Gustin, MD;  Location: [facility];  Service: Urology;  Laterality: N/A;    Family History  Problem Relation Age of Onset  . Hyperlipidemia Mother   . Hyperlipidemia Father   . Cancer Maternal Aunt        stomach    Social History   Socioeconomic History  . Marital status: Widowed    Spouse name: Not on file  . Number of children: 8  . Years of education: 6th grade  . Highest education level: Not on file  Occupational History  . Not on file  Tobacco Use  . Smoking status: Never Smoker  . Smokeless tobacco: Never Used  Substance and Sexual Activity  . Alcohol use: Not Currently    Comment: hx heavy alcohol  -- quit 1999  . Drug use: No  . Sexual activity: Yes  Other Topics Concern  . Not on file  Social History Narrative   He lives at home alone   Drinks 2 cups of coffee daily    Right handed   Social  Determinants of Health   Financial Resource Strain:   . Difficulty of Paying Living Expenses:   Food Insecurity:   . Worried About Charity fundraiser in the Last Year:   . Arboriculturist in the Last Year:   Transportation Needs:   . Film/video editor (Medical):   [name] Lack of Transportation (Non-Medical):   Physical Activity:   . Days of Exercise per Week:   . Minutes of Exercise per Session:   Stress:   . Feeling of Stress :   Social Connections:   . Frequency of Communication with Friends and Family:   . Frequency of Social Gatherings with Friends and Family:   . Attends Religious Services:   . Active Member of Clubs or Organizations:   . Attends Archivist Meetings:    [name] Marital Status:   Intimate Partner Violence:   . Fear of Current or Ex-Partner:   . Emotionally Abused:   [name] Physically Abused:   . Sexually Abused:     Outpatient Medications Prior to Visit  Medication Sig Dispense Refill  . alfuzosin (UROXATRAL) 10 MG 24 hr tablet Take 1 tablet (10 mg total) by mouth 2 (two) times daily. 60 tablet 3  . amLODipine (NORVASC) 5 MG tablet TAKE 2 TABLETS BY MOUTH DAILY.----  takes in pm 90 tablet 0  . atorvastatin (LIPITOR) 20 MG tablet TAKE 1 TABLET(20 MG) BY MOUTH DAILY 90 tablet 0  . esomeprazole (NEXIUM) 40 MG capsule TAKE 1 CAPSULE BY MOUTH DAILY BEFORE BREAKFAST. 90 capsule 1  . Vitamin D, Ergocalciferol, (DRISDOL) 1.25 MG (50000 UNIT) CAPS capsule Take 1 capsule (50,000 Units total) by mouth every 7 (seven) days. 5 capsule 6  . phenazopyridine (PYRIDIUM) 200 MG tablet Take 1 tablet (200 mg total) by mouth 3 (three) times daily as needed for pain. (Patient not taking: Reported on 01/14/2020) 90 tablet 1   No facility-administered medications prior to visit.    No Known Allergies  ROS Review of Systems  Constitutional: Negative.   HENT: Negative.   Eyes: Negative.   Respiratory: Negative.   Cardiovascular: Negative.   Gastrointestinal: Positive for abdominal distention.  Endocrine: Negative.   Genitourinary: Positive for dysuria.  Musculoskeletal: Positive for arthralgias.       Pelvic/hip pain  Skin: Negative.   Allergic/Immunologic: Negative.   Neurological: Positive for dizziness (occasional ) and headaches (occasional ).  Hematological: Negative.   Psychiatric/Behavioral: Negative.     Objective:    Physical Exam  Constitutional: He is oriented to person, place, and time. He appears well-developed and well-nourished.  HENT:  Head: Normocephalic and atraumatic.  Eyes: Conjunctivae are normal.  Cardiovascular: Normal rate, regular rhythm, normal heart sounds and intact distal pulses.  Pulmonary/Chest: Effort normal and breath  sounds normal.  Abdominal: Soft. Bowel sounds are normal. He exhibits distension.  Musculoskeletal:        General: Normal range of motion.     Cervical back: Normal range of motion and neck supple.  Neurological: He is alert and oriented to person, place, and time. He has normal reflexes.  Skin: Skin is warm and dry.  Psychiatric: He has a normal mood and affect. His behavior is normal. Judgment and thought content normal.  Nursing note and vitals reviewed.   BP 140/73   Pulse 83   Temp 97.7 F (36.5 C)   Ht 5\' 6"  (1.676 m)   Wt 190 lb 3.2 oz (86.3 kg)   SpO2 99%   BMI  30.70 kg/m  Wt Readings from Last 3 Encounters:  01/14/20 190 lb 3.2 oz (86.3 kg)  11/05/19 193 lb 12.8 oz (87.9 kg)  11/03/19 193 lb 12.8 oz (87.9 kg)     Health Maintenance Due  Topic Date Due  . COVID-19 Vaccine (1) Never done  . PNA vac Low Risk Adult (2 of 2 - PPSV23) 01/30/2018    There are no preventive care reminders to display for this patient.  Lab Results  Component Value Date   TSH 1.960 11/05/2019   Lab Results  Component Value Date   WBC 3.3 (L) 11/05/2019   HGB 11.4 (L) 11/05/2019   HCT 34.8 (L) 11/05/2019   MCV 93 11/05/2019   PLT 213 11/05/2019   Lab Results  Component Value Date   NA 140 11/05/2019   K 4.4 11/05/2019   CO2 18 (L) 11/05/2019   GLUCOSE 115 (H) 11/05/2019   BUN 19 11/05/2019   CREATININE 1.51 (H) 11/05/2019   BILITOT 0.4 11/05/2019   ALKPHOS 102 11/05/2019   AST 15 11/05/2019   ALT 18 11/05/2019   PROT 6.6 11/05/2019   ALBUMIN 4.3 11/05/2019   CALCIUM 8.8 11/05/2019   ANIONGAP 11 07/25/2014   Lab Results  Component Value Date   CHOL 124 11/05/2019   Lab Results  Component Value Date   HDL 50 11/05/2019   Lab Results  Component Value Date   LDLCALC 45 11/05/2019   Lab Results  Component Value Date   TRIG 175 (H) 11/05/2019   Lab Results  Component Value Date   CHOLHDL 2.5 11/05/2019   Lab Results  Component Value Date   HGBA1C 6.1 (A)  11/05/2019      Assessment & Plan:   1. Arthralgia of hip, unspecified laterality - Arthritis Panel - NM Bone Scan Whole Body; Future  2. Pain in pelvis - NM Bone Scan Whole Body; Future  3. Acute hip pain, bilateral - NM Bone Scan Whole Body; Future  4. History of prostate cancer  5. Burning with urination - Trichomonas vaginalis, RNA - GC/Chlamydia Probe Amp([facility]) - HIV antibody (with reflex)  6. Weak urinary stream - Trichomonas vaginalis, RNA - GC/Chlamydia Probe Amp([facility]) - HIV antibody (with reflex)  7. Dysuria  8. Essential hypertension The current medical regimen is effective; blood pressur is stable at 140/73 today; continue present plan and medications as prescribed. He will continue to take medications as prescribed, to decrease high sodium intake, excessive alcohol intake, increase potassium intake, smoking cessation, and increase physical activity of at least 30 minutes of cardio activity daily. He will continue to follow Heart Healthy or DASH diet. - POCT urinalysis dipstick  9. Vitamin D deficiency  10. Follow up He will follow up in 3 months.  No orders of the defined types were placed in this encounter.   Orders Placed This Encounter  Procedures  . Trichomonas vaginalis, RNA  . GC/Chlamydia Probe Amp([facility])  . NM Bone Scan Whole Body  . HIV antibody (with reflex)  . Arthritis Panel  . POCT urinalysis dipstick    Referral Orders  No referral(s) requested today    Kathe Becton,  MSN, FNP-BC [place] [address] [place], [place] [address] [phone]- fax  Problem List Items Addressed This Visit      Cardiovascular and Mediastinum   HTN (hypertension)   Relevant Orders   POCT urinalysis dipstick (Completed)     Genitourinary  Malignant neoplasm of prostate ([place])     Other   Burning with urination   Relevant Orders   Trichomonas  vaginalis, RNA   GC/Chlamydia Probe Amp([facility])   HIV antibody (with reflex)   Weak urinary stream   Relevant Orders   Trichomonas vaginalis, RNA   GC/Chlamydia Probe Amp([facility])   HIV antibody (with reflex)    Other Visit Diagnoses    Arthralgia of hip, unspecified laterality    -  Primary   Relevant Orders   Arthritis Panel   NM Bone Scan Whole Body   Pain in pelvis       Relevant Orders   NM Bone Scan Whole Body   Acute hip pain, bilateral       Relevant Orders   NM Bone Scan Whole Body   Dysuria       Vitamin D deficiency       Follow up          No orders of the defined types were placed in this encounter.   Follow-up: Return in about 3 months (around 04/14/2020).    Azzie Glatter, FNP

## 2020-01-15 LAB — ARTHRITIS PANEL
Anti Nuclear Antibody (ANA): NEGATIVE
Rheumatoid fact SerPl-aCnc: <10 IU/mL (ref 0.0–13.9)
Sed Rate: 28 mm/hr (ref 0–30)
Uric Acid: 5.9 mg/dL (ref 3.8–8.4)

## 2020-01-15 LAB — HIV ANTIBODY (ROUTINE TESTING W REFLEX): HIV Screen 4th Generation wRfx: NONREACTIVE

## 2020-01-17 ENCOUNTER — Telehealth: Payer: Self-pay | Admitting: Family Medicine

## 2020-01-17 LAB — TRICHOMONAS VAGINALIS, PROBE AMP: Trich vag by NAA: POSITIVE — AB

## 2020-01-17 LAB — GC/CHLAMYDIA PROBE AMP
Chlamydia trachomatis, NAA: NEGATIVE
Neisseria Gonorrhoeae by PCR: NEGATIVE

## 2020-01-17 NOTE — Telephone Encounter (Signed)
Done

## 2020-01-20 ENCOUNTER — Other Ambulatory Visit: Payer: Self-pay | Admitting: Family Medicine

## 2020-01-20 DIAGNOSIS — A599 Trichomoniasis, unspecified: Secondary | ICD-10-CM

## 2020-01-20 MED ORDER — METRONIDAZOLE 500 MG PO TABS: 2000.0000 mg | ORAL_TABLET | Freq: Once | ORAL | 0 refills | Status: AC

## 2020-01-21 ENCOUNTER — Telehealth: Payer: Self-pay

## 2020-01-21 NOTE — Telephone Encounter (Signed)
Patient aware of Rx.  

## 2020-01-21 NOTE — Telephone Encounter (Signed)
-----   Message from Jorge Martin, [place] sent at 01/20/2020  6:28 PM EDT ----- Positive for Trichomonas. Rx for Metronidazole sent to pharmacy. All other labs are stable. Spoke with patient about results. Please contact patient to pick up medication from pharmacy. Thank you.

## 2020-01-28 ENCOUNTER — Ambulatory Visit (HOSPITAL_COMMUNITY)
Admission: RE | Admit: 2020-01-28 | Discharge: 2020-01-28 | Disposition: A | Discharge status: Home / Self Care | Payer: Medicare Other | Source: Ambulatory Visit | Attending: Family Medicine | Admitting: Family Medicine

## 2020-01-28 ENCOUNTER — Other Ambulatory Visit: Payer: Self-pay

## 2020-01-28 DIAGNOSIS — M25552 Pain in left hip: Secondary | ICD-10-CM | POA: Insufficient documentation

## 2020-01-28 DIAGNOSIS — M25559 Pain in unspecified hip: Secondary | ICD-10-CM | POA: Insufficient documentation

## 2020-01-28 DIAGNOSIS — R102 Pelvic and perineal pain: Secondary | ICD-10-CM | POA: Diagnosis present

## 2020-01-28 DIAGNOSIS — M25551 Pain in right hip: Secondary | ICD-10-CM | POA: Diagnosis present

## 2020-01-28 MED ORDER — TECHNETIUM TC 99M MEDRONATE IV KIT
21.0000 | PACK | Freq: Once | INTRAVENOUS | Status: AC | PRN
  Administered 2020-01-28: 21 via INTRAVENOUS

## 2020-02-03 ENCOUNTER — Ambulatory Visit: Payer: Medicare Other | Admitting: Family Medicine

## 2020-02-03 ENCOUNTER — Encounter: Payer: Self-pay | Admitting: Family Medicine

## 2020-02-03 ENCOUNTER — Other Ambulatory Visit: Payer: Self-pay

## 2020-02-03 VITALS — BP 129/75 | HR 68 | Ht 66.0 in | Wt 185.0 lb

## 2020-02-03 DIAGNOSIS — G4733 Obstructive sleep apnea (adult) (pediatric): Secondary | ICD-10-CM

## 2020-02-03 DIAGNOSIS — Z9989 Dependence on other enabling machines and devices: Secondary | ICD-10-CM

## 2020-02-03 NOTE — Patient Instructions (Signed)
Please continue using your CPAP regularly. While your insurance requires that you use CPAP at least 4 hours each night on 70% of the nights, I recommend, that you not skip any nights and use it throughout the night if you can. Getting used to CPAP and staying with the treatment long term does take time and patience and discipline. Untreated obstructive sleep apnea when it is moderate to severe can have an adverse impact on cardiovascular health and raise her risk for heart disease, arrhythmias, hypertension, congestive heart failure, stroke and diabetes. Untreated obstructive sleep apnea causes sleep disruption, nonrestorative sleep, and sleep deprivation. This can have an impact on your day to day functioning and cause daytime sleepiness and impairment of cognitive function, memory loss, mood disturbance, and problems focussing. Using CPAP regularly can improve these symptoms.   Try melatonin 3mg  about 1-2 hours before bedtime for help with insomnia and anxiety. Follow up closely with PCP if these symptoms worsen.   Follow up with me in 3 months   Sleep Apnea Sleep apnea affects breathing during sleep. It causes breathing to stop for a short time or to become shallow. It can also increase the risk of:  Heart attack.  Stroke.  Being very overweight (obese).  Diabetes.  Heart failure.  Irregular heartbeat. The goal of treatment is to help you breathe normally again. What are the causes? There are three kinds of sleep apnea:  Obstructive sleep apnea. This is caused by a blocked or collapsed airway.  Central sleep apnea. This happens when the brain does not send the right signals to the muscles that control breathing.  Mixed sleep apnea. This is a combination of obstructive and central sleep apnea. The most common cause of this condition is a collapsed or blocked airway. This can happen if:  Your throat muscles are too relaxed.  Your tongue and tonsils are too large.  You are  overweight.  Your airway is too small. What increases the risk?  Being overweight.  Smoking.  Having a small airway.  Being older.  Being male.  Drinking alcohol.  Taking medicines to calm yourself (sedatives or tranquilizers).  Having family members with the condition. What are the signs or symptoms?  Trouble staying asleep.  Being sleepy or tired during the day.  Getting angry a lot.  Loud snoring.  Headaches in the morning.  Not being able to focus your mind (concentrate).  Forgetting things.  Less interest in sex.  Mood swings.  Personality changes.  Feelings of sadness (depression).  Waking up a lot during the night to pee (urinate).  Dry mouth.  Sore throat. How is this diagnosed?  Your medical history.  A physical exam.  A test that is done when you are sleeping (sleep study). The test is most often done in a sleep lab but may also be done at home. How is this treated?   Sleeping on your side.  Using a medicine to get rid of mucus in your nose (decongestant).  Avoiding the use of alcohol, medicines to help you relax, or certain pain medicines (narcotics).  Losing weight, if needed.  Changing your diet.  Not smoking.  Using a machine to open your airway while you sleep, such as: ? An oral appliance. This is a mouthpiece that shifts your lower jaw forward. ? A CPAP device. This device blows air through a mask when you breathe out (exhale). ? An EPAP device. This has valves that you put in each nostril. ? A BPAP  device. This device blows air through a mask when you breathe in (inhale) and breathe out.  Having surgery if other treatments do not work. It is important to get treatment for sleep apnea. Without treatment, it can lead to:  High blood pressure.  Coronary artery disease.  In men, not being able to have an erection (impotence).  Reduced thinking ability. Follow these instructions at home: Lifestyle  Make changes  that your doctor recommends.  Eat a healthy diet.  Lose weight if needed.  Avoid alcohol, medicines to help you relax, and some pain medicines.  Do not use any products that contain nicotine or tobacco, such as cigarettes, e-cigarettes, and chewing tobacco. If you need help quitting, ask your doctor. General instructions  Take over-the-counter and prescription medicines only as told by your doctor.  If you were given a machine to use while you sleep, use it only as told by your doctor.  If you are having surgery, make sure to tell your doctor you have sleep apnea. You may need to bring your device with you.  Keep all follow-up visits as told by your doctor. This is important. Contact a doctor if:  The machine that you were given to use during sleep bothers you or does not seem to be working.  You do not get better.  You get worse. Get help right away if:  Your chest hurts.  You have trouble breathing in enough air.  You have an uncomfortable feeling in your back, arms, or stomach.  You have trouble talking.  One side of your body feels weak.  A part of your face is hanging down. These symptoms may be an emergency. Do not wait to see if the symptoms will go away. Get medical help right away. Call your local emergency services (911 in the [place] Do not drive yourself to the hospital. Summary  This condition affects breathing during sleep.  The most common cause is a collapsed or blocked airway.  The goal of treatment is to help you breathe normally while you sleep. This information is not intended to replace advice given to you by your health care provider. Make sure you discuss any questions you have with your health care provider. Document Revised: 06/19/2018 Document Reviewed: 04/28/2018 Elsevier Patient Education  [place].

## 2020-02-03 NOTE — Progress Notes (Addendum)
PATIENT: Jorge Martin DOB: [birth year]  REASON FOR VISIT: follow up HISTORY FROM: patient  Chief Complaint  Patient presents with  . Follow-up    cpap fu, pt states he is doing well with cpap     HISTORY OF PRESENT ILLNESS: Today 02/03/20 Jorge Martin is a 85 y.o. male here today for follow up for OSA on CPAP.  He reports that he is doing well on CPAP therapy.  He does feel better when he is using CPAP he has not had any difficulty with headaches.  He feels that he has more difficulty using CPAP if he is bothered by something.  Sometimes he lays in the bed and worries at night.  He admits that usage has been sporadic over the past 30 days.  He is motivated to continue using CPAP.  He understands risk factors of untreated sleep apnea.  Compliance report dated 01/03/2020 through 02/01/2020 reveals that he used CPAP 12 of the past 30 days for compliance of 40%.  He used CPAP greater than 4 hours 8 of the past 30 days for compliance of 27%.  Average usage on days used was 4 hours and 9 minutes.  Residual AHI was 2.3 on 9 cm of water and an EPR of 3.  There was no significant leak noted.  HISTORY: (copied from my note on 11/03/2019)   Jorge Martin is a 85 y.o. male here today for follow up for OSA on CPAP. He reports that he is doing well overall. He does admit that he has not used CPAP consistently over the past few months.  He denies any difficulty with his machine.  He has all the supplies he needs.  He just states that there are days he does not want to use it.  He states that he does not usually sleep for 4 hours each night.  He does not nap during the day.  He feels that he just does not need much sleep.  He does continue to have persistent headaches.  He feels that overall, headaches have improved.  He does have about 2 mild headaches a week.  These are easily treated with Tylenol.  Compliance report dated 08/04/2019 through 11/01/2019 reveals that he used CPAP 25 of the last 90  days.  He used CPAP 15 of the last 90 days for greater than 4 hours for compliance of 17%.  Average usage on days used was 4 hours and 27 minutes.  Residual AHI was 1.5 on 9 cm of water and an EPR of 3.  There was no significant leak noted.   HISTORY: (copied from my note on 10/28/2018)  Jorge Martin a 85 y.o.malehere today for follow up. He has history of daily headaches and OSA.He reports that he is feeling well. He is using cpap regularly and feels benefit. Download reports shows that he is using CPAP 28/30 nights for compliance of 93%. He is only using CPAP greater than 4 hours 8/30 nights for compliance of 27%. His AHI is 0.8 on 9cm H2O with EPR of 3. There is no significant leak. He reports that he rarely sleeps for more than 3-4 hours at night. He has BPH and is up multiple times at night. He does states that he naps frequently during the day.  HISTORY: (copied fromDr Athar'snote on 04/01/2018)  Today, 04/01/2018: I reviewed his CPAP compliance data from 03/01/2018 through 03/30/2018, which is a total of 30 days, during which time he used his CPAP  99 days with percent used days greater than 4 hours at 57%, indicating suboptimal compliance with an average usage of 4 hours and 27 minutes residual AHI at goal at 0.8 per hour, leak acceptable with the 95thpercentile at 13.2 L/m on a pressure of 10 cm with EPR of 2. In the past 90 days his compliance for more than 4 hours has been 42%. In the very first month of starting CPAP therapy his compliance was better, in fact between 02/05/2018 and 03/06/2018 his compliance for more than 4 hours was 90% which is excellent. He reports doing well. CPAP is working well for him.He reports an improvement in his sleep quality and daytime sleepiness. His headaches are better as well. He has had some issues tolerating the pressure and had some change in his humidity setting as moisture was accumulating in his nasal mask. Nevertheless, he struggles with  nasal burning and has to take the mask off in the middle of the night, triestoputit back onlater.    REVIEW OF SYSTEMS: Out of a complete 14 system review of symptoms, the patient complains only of the following symptoms, anxiety and all other reviewed systems are negative.  ESS: 10  ALLERGIES: No Known Allergies  HOME MEDICATIONS: Outpatient Medications Prior to Visit  Medication Sig Dispense Refill  . alfuzosin (UROXATRAL) 10 MG 24 hr tablet Take 1 tablet (10 mg total) by mouth 2 (two) times daily. 60 tablet 3  . amLODipine (NORVASC) 5 MG tablet TAKE 2 TABLETS BY MOUTH DAILY.----  takes in pm 90 tablet 0  . atorvastatin (LIPITOR) 20 MG tablet TAKE 1 TABLET(20 MG) BY MOUTH DAILY 90 tablet 0  . donepezil (ARICEPT) 10 MG tablet Take 10 mg by mouth at bedtime.    [name] esomeprazole (NEXIUM) 40 MG capsule TAKE 1 CAPSULE BY MOUTH DAILY BEFORE BREAKFAST. 90 capsule 1  . latanoprost (XALATAN) 0.005 % ophthalmic solution 1 drop at bedtime.    . metroNIDAZOLE (FLAGYL) 500 MG tablet     . phenazopyridine (PYRIDIUM) 200 MG tablet Take 1 tablet (200 mg total) by mouth 3 (three) times daily as needed for pain. 90 tablet 1  . solifenacin (VESICARE) 5 MG tablet Take 5 mg by mouth daily.    . Vitamin D, Ergocalciferol, (DRISDOL) 1.25 MG (50000 UNIT) CAPS capsule Take 1 capsule (50,000 Units total) by mouth every 7 (seven) days. 5 capsule 6   No facility-administered medications prior to visit.    PAST MEDICAL HISTORY: Past Medical History:  Diagnosis Date  . Arthritis   . ED (erectile dysfunction)   . GERD (gastroesophageal reflux disease)   . Headache   . History of alcoholism ([place])    02-17-2018  per pt quit 1999  . History of gout 09/2011   right ankle  . Hypercholesteremia   . Hyperplasia of prostate with lower urinary tract symptoms (LUTS)   . Hypertension   . OSA on CPAP followed by dr Rexene Alberts   study 11-25-2017 moderate to severe osa (AHI 28.4/hr,  REM AHI 38/hr)  . Pre-diabetes     . Prostate cancer [facility]) urologist-  dr Alyson Ingles  onologist-  dr Tammi Klippel   dx 11-18-2017  Stage T2a,  Gleason 4+3,  PSA 16.90,  vol 124cc-- treatment plan external beam radiation  . Seasonal allergic rhinitis   . Weak urinary stream     PAST SURGICAL HISTORY: Past Surgical History:  Procedure Laterality Date  . GOLD SEED IMPLANT N/A 02/23/2018   Procedure: GOLD SEED IMPLANT;  Surgeon: Alyson Ingles,  Candee Furbish, MD;  Location: [facility];  Service: Urology;  Laterality: N/A;  . NO PAST SURGERIES    . PROSTATE BIOPSY  11-18-2017  dr Alyson Ingles  office  . SPACE OAR INSTILLATION N/A 02/23/2018   Procedure: SPACE OAR INSTILLATION;  Surgeon: Cleon Gustin, MD;  Location: [facility];  Service: Urology;  Laterality: N/A;    FAMILY HISTORY: Family History  Problem Relation Age of Onset  . Hyperlipidemia Mother   . Hyperlipidemia Father   . Cancer Maternal Aunt        stomach    SOCIAL HISTORY: Social History   Socioeconomic History  . Marital status: Widowed    Spouse name: Not on file  . Number of children: 8  . Years of education: 6th grade  . Highest education level: Not on file  Occupational History  . Not on file  Tobacco Use  . Smoking status: Never Smoker  . Smokeless tobacco: Never Used  Substance and Sexual Activity  . Alcohol use: Not Currently    Comment: hx heavy alcohol  -- quit 1999  . Drug use: No  . Sexual activity: Yes  Other Topics Concern  . Not on file  Social History Narrative   He lives at home alone   Drinks 2 cups of coffee daily    Right handed   Social Determinants of Health   Financial Resource Strain:   . Difficulty of Paying Living Expenses:   Food Insecurity:   . Worried About Charity fundraiser in the Last Year:   . Arboriculturist in the Last Year:   Transportation Needs:   . Film/video editor (Medical):   [name] Lack of Transportation (Non-Medical):   Physical Activity:   . Days of Exercise per Week:    . Minutes of Exercise per Session:   Stress:   . Feeling of Stress :   Social Connections:   . Frequency of Communication with Friends and Family:   . Frequency of Social Gatherings with Friends and Family:   . Attends Religious Services:   . Active Member of Clubs or Organizations:   . Attends Archivist Meetings:   [name] Marital Status:   Intimate Partner Violence:   . Fear of Current or Ex-Partner:   . Emotionally Abused:   [name] Physically Abused:   . Sexually Abused:       PHYSICAL EXAM  Vitals:   02/03/20 0957  BP: 129/75  Pulse: 68  Weight: 185 lb (83.9 kg)  Height: 5\' 6"  (1.676 m)   Body mass index is 29.86 kg/m.  Generalized: Well developed, in no acute distress  Cardiology: normal rate and rhythm, no murmur noted Respiratory: clear to auscultation bilaterally  Neurological examination  Mentation: Alert oriented to time, place, history taking. Follows all commands speech and language fluent Cranial nerve II-XII: Pupils were equal round reactive to light. Extraocular movements were full, visual field were full  Motor: The motor testing reveals 5 over 5 strength of all 4 extremities. Good symmetric motor tone is noted throughout.  Gait and station: Gait is normal.   DIAGNOSTIC DATA (LABS, IMAGING, TESTING) - I reviewed patient records, labs, notes, testing and imaging myself where available.  No flowsheet data found.   Lab Results  Component Value Date   WBC 3.3 (L) 11/05/2019   HGB 11.4 (L) 11/05/2019   HCT 34.8 (L) 11/05/2019   MCV 93 11/05/2019   PLT 213 11/05/2019  Component Value Date/Time   NA 140 11/05/2019 1540   K 4.4 11/05/2019 1540   CL 106 11/05/2019 1540   CO2 18 (L) 11/05/2019 1540   GLUCOSE 115 (H) 11/05/2019 1540   GLUCOSE 103 (H) 02/23/2018 1432   BUN 19 11/05/2019 1540   CREATININE 1.51 (H) 11/05/2019 1540   CREATININE 1.27 (H) 08/11/2017 1042   CALCIUM 8.8 11/05/2019 1540   PROT 6.6 11/05/2019 1540   ALBUMIN 4.3  11/05/2019 1540   AST 15 11/05/2019 1540   ALT 18 11/05/2019 1540   ALKPHOS 102 11/05/2019 1540   BILITOT 0.4 11/05/2019 1540   GFRNONAA 42 (L) 11/05/2019 1540   GFRNONAA 53 (L) 08/11/2017 1042   GFRAA 49 (L) 11/05/2019 1540   GFRAA 61 08/11/2017 1042   Lab Results  Component Value Date   CHOL 124 11/05/2019   HDL 50 11/05/2019   LDLCALC 45 11/05/2019   TRIG 175 (H) 11/05/2019   CHOLHDL 2.5 11/05/2019   Lab Results  Component Value Date   HGBA1C 6.1 (A) 11/05/2019   Lab Results  Component Value Date   VITAMINB12 1,161 11/05/2019   Lab Results  Component Value Date   TSH 1.960 11/05/2019     ASSESSMENT AND PLAN 85 y.o. year old male  has a past medical history of Arthritis, ED (erectile dysfunction), GERD (gastroesophageal reflux disease), Headache, History of alcoholism ([place]), History of gout (09/2011), Hypercholesteremia, Hyperplasia of prostate with lower urinary tract symptoms (LUTS), Hypertension, OSA on CPAP (followed by dr Rexene Alberts), Pre-diabetes, Prostate cancer [facility]) (urologist-  dr Alyson Ingles  onologist-  dr Tammi Klippel), Seasonal allergic rhinitis, and Weak urinary stream. here with     ICD-10-CM   1. OSA on CPAP  G47.33    Z99.89     Dalonte reports that he is doing fairly well with CPAP therapy.  He admits that usage has not been consistent over the past 30 days.  We have discussed his diagnosis of obstructive sleep apnea and risk factors for untreated sleep apnea.  He does note improvement in sleep quality and increased energy levels when he is using his machine.  He does wish to continue therapy.  I have advised that he consider taking melatonin 3 mg at bedtime.  I am hopeful that this may help with insomnia and anxiety which will in turn increase compliance.  Encouraged to continue close follow-up with primary care should any of the symptoms worsen or melatonin be ineffective.  Headaches seem to have resolved.  He request that I follow back up in 3 months as he is hoping  this will help with accountability.  We will schedule follow-up in 3 months.  He verbalizes understanding and agreement with this plan.   No orders of the defined types were placed in this encounter.    No orders of the defined types were placed in this encounter.     I spent 15 minutes with the patient. 50% of this time was spent counseling and educating patient on plan of care and medications.    Debbora Presto, FNP-C 02/03/2020, 10:34 AM [facility] [address], [place], [place] [address] [phone]  I reviewed the above note and documentation by the Nurse Practitioner and agree with the history, exam, assessment and plan as outlined above. I was available for consultation. Star Age, MD, PhD [facility])

## 2020-02-04 ENCOUNTER — Telehealth: Payer: Self-pay | Admitting: Family Medicine

## 2020-02-04 NOTE — Telephone Encounter (Signed)
Jorge Martin called: He wanted to know the results of his bone scan. It was completed last week.   Call back ph# [phone].

## 2020-02-07 ENCOUNTER — Other Ambulatory Visit: Payer: Self-pay | Admitting: Family Medicine

## 2020-02-07 DIAGNOSIS — M25559 Pain in unspecified hip: Secondary | ICD-10-CM

## 2020-02-07 MED ORDER — ACETAMINOPHEN 500 MG PO TABS: 500.0000 mg | ORAL_TABLET | Freq: Two times a day (BID) | ORAL | 6 refills | Status: DC

## 2020-02-20 ENCOUNTER — Other Ambulatory Visit: Payer: Self-pay | Admitting: Family Medicine

## 2020-04-05 ENCOUNTER — Ambulatory Visit: Payer: Medicare Other | Admitting: Family Medicine

## 2020-04-14 ENCOUNTER — Ambulatory Visit: Payer: Medicare Other | Admitting: Family Medicine

## 2020-05-05 ENCOUNTER — Ambulatory Visit: Payer: Medicare Other | Admitting: Family Medicine

## 2020-05-10 ENCOUNTER — Ambulatory Visit: Payer: Medicare Other | Admitting: Family Medicine

## 2020-05-11 ENCOUNTER — Encounter: Payer: Self-pay | Admitting: Family Medicine

## 2020-05-11 ENCOUNTER — Ambulatory Visit: Payer: Medicare Other | Admitting: Family Medicine

## 2020-05-11 VITALS — BP 110/62 | HR 58 | Ht 66.0 in | Wt 187.8 lb

## 2020-05-11 DIAGNOSIS — Z9989 Dependence on other enabling machines and devices: Secondary | ICD-10-CM

## 2020-05-11 DIAGNOSIS — G4733 Obstructive sleep apnea (adult) (pediatric): Secondary | ICD-10-CM | POA: Diagnosis not present

## 2020-05-11 NOTE — Progress Notes (Addendum)
PATIENT: Jorge Martin DOB: 1935/08/31  REASON FOR VISIT: follow up HISTORY FROM: patient  Chief Complaint  Patient presents with  . Follow-up    3 month f/u for OSA. States he has been doing well since last visit.   [name] room 2    alone      HISTORY OF PRESENT ILLNESS: Today 05/11/20 Jorge Martin is a 85 y.o. male here today for follow up for OSA on CPAP therapy. He reports that he is doing well. He is getting more comfortable with therapy. He denies concerns with machine or supplies.   Compliance report dated 04/10/2020 through 05/09/2020 reveals that he used CPAP 18 of the past 30 days for compliance of 60%.  He used CPAP greater than 4 hours 14 of the last 30 days for compliance of 47%.  Average usage on days used was 5 hours and 59 minutes.  Residual AHI was 0.8 at a set pressure of 9 cm of water and EPR of 3.  There was no significant leak noted.  HISTORY: (copied from my note on 02/03/2020)  REVAN Martin is a 85 y.o. male here today for follow up for OSA on CPAP.  He reports that he is doing well on CPAP therapy.  He does feel better when he is using CPAP he has not had any difficulty with headaches.  He feels that he has more difficulty using CPAP if he is bothered by something.  Sometimes he lays in the bed and worries at night.  He admits that usage has been sporadic over the past 30 days.  He is motivated to continue using CPAP.  He understands risk factors of untreated sleep apnea.  Compliance report dated 01/03/2020 through 02/01/2020 reveals that he used CPAP 12 of the past 30 days for compliance of 40%.  He used CPAP greater than 4 hours 8 of the past 30 days for compliance of 27%.  Average usage on days used was 4 hours and 9 minutes.  Residual AHI was 2.3 on 9 cm of water and an EPR of 3.  There was no significant leak noted.  HISTORY: (copied from my note on 11/03/2019)   Jorge Penna Harrisonis a 85 y.o.malehere today for follow up for OSA on CPAP. He reports  that he is doing well overall. He does admit that he has not usedCPAP consistently over the past few months. He denies any difficulty with his machine. He has all the supplies he needs. He just states that there are days he does not want to use it. He states that he does not usually sleep for 4 hours each night. He does not nap during the day. He feels that he just does not need much sleep. He does continue to have persistent headaches. He feels that overall, headaches have improved. He does have about 2 mild headaches a week. These are easily treated with Tylenol.  Compliance report dated 08/04/2019 through 11/01/2019 reveals that he used CPAP 25 of the last 90 days. He used CPAP 15 of the last 90 days for greater than 4 hours for compliance of 17%. Average usage on days used was 4 hours and 27 minutes. Residual AHI was 1.5 on 9 cm of water and an EPR of 3. There was no significant leak noted.   HISTORY: (copied frommynote on 10/28/2018)  Jorge Cuadros Harrisonis a 85 y.o.malehere today for follow up. He has history of daily headaches and OSA.He reports that he is feeling well.  He is using cpap regularly and feels benefit. Download reports shows that he is using CPAP 28/30 nights for compliance of 93%. He is only using CPAP greater than 4 hours 8/30 nights for compliance of 27%. His AHI is 0.8 on 9cm H2O with EPR of 3. There is no significant leak. He reports that he rarely sleeps for more than 3-4 hours at night. He has BPH and is up multiple times at night. He does states that he naps frequently during the day.  HISTORY: (copied fromDr Athar'snote on 04/01/2018)  Today, 04/01/2018: I reviewed his CPAP compliance data from 03/01/2018 through 03/30/2018, which is a total of 30 days, during which time he used his CPAP 99 days with percent used days greater than 4 hours at 57%, indicating suboptimal compliance with an average usage of 4 hours and 27 minutes residual AHI at goal at 0.8  per hour, leak acceptable with the 95thpercentile at 13.2 L/m on a pressure of 10 cm with EPR of 2. In the past 90 days his compliance for more than 4 hours has been 42%. In the very first month of starting CPAP therapy his compliance was better, in fact between 02/05/2018 and 03/06/2018 his compliance for more than 4 hours was 90% which is excellent. He reports doing well. CPAP is working well for him.He reports an improvement in his sleep quality and daytime sleepiness. His headaches are better as well. He has had some issues tolerating the pressure and had some change in his humidity setting as moisture was accumulating in his nasal mask. Nevertheless, he struggles with nasal burning and has to take the mask off in the middle of the night, triestoputit back onlater.   REVIEW OF SYSTEMS: Out of a complete 14 system review of symptoms, the patient complains only of the following symptoms, none and all other reviewed systems are negative.   ALLERGIES: No Known Allergies  HOME MEDICATIONS: Outpatient Medications Prior to Visit  Medication Sig Dispense Refill  . acetaminophen (TYLENOL) 500 MG tablet Take 1 tablet (500 mg total) by mouth in the morning and at bedtime. (May increase dose to 2 tablets (1,000 mg), 2 times a day if above dose is not effective). 60 tablet 6  . alfuzosin (UROXATRAL) 10 MG 24 hr tablet Take 1 tablet (10 mg total) by mouth 2 (two) times daily. 60 tablet 3  . amLODipine (NORVASC) 5 MG tablet TAKE 2 TABLETS BY MOUTH DAILY.----  takes in pm 90 tablet 0  . atorvastatin (LIPITOR) 20 MG tablet TAKE 1 TABLET(20 MG) BY MOUTH DAILY 90 tablet 1  . donepezil (ARICEPT) 10 MG tablet Take 10 mg by mouth at bedtime.    [name] esomeprazole (NEXIUM) 40 MG capsule TAKE 1 CAPSULE BY MOUTH DAILY BEFORE BREAKFAST. 90 capsule 1  . latanoprost (XALATAN) 0.005 % ophthalmic solution 1 drop at bedtime.    . solifenacin (VESICARE) 5 MG tablet Take 5 mg by mouth daily.    . Vitamin D, Ergocalciferol,  (DRISDOL) 1.25 MG (50000 UNIT) CAPS capsule Take 1 capsule (50,000 Units total) by mouth every 7 (seven) days. 5 capsule 6  . metroNIDAZOLE (FLAGYL) 500 MG tablet     . phenazopyridine (PYRIDIUM) 200 MG tablet Take 1 tablet (200 mg total) by mouth 3 (three) times daily as needed for pain. 90 tablet 1   No facility-administered medications prior to visit.    PAST MEDICAL HISTORY: Past Medical History:  Diagnosis Date  . Arthritis   . ED (erectile dysfunction)   .  GERD (gastroesophageal reflux disease)   . Headache   . History of alcoholism ([place])    02-17-2018  per pt quit 1999  . History of gout 09/2011   right ankle  . Hypercholesteremia   . Hyperplasia of prostate with lower urinary tract symptoms (LUTS)   . Hypertension   . OSA on CPAP followed by dr Rexene Alberts   study 11-25-2017 moderate to severe osa (AHI 28.4/hr,  REM AHI 38/hr)  . Pre-diabetes   . Prostate cancer [facility]) urologist-  dr Alyson Ingles  onologist-  dr Tammi Klippel   dx 11-18-2017  Stage T2a,  Gleason 4+3,  PSA 16.90,  vol 124cc-- treatment plan external beam radiation  . Seasonal allergic rhinitis   . Weak urinary stream     PAST SURGICAL HISTORY: Past Surgical History:  Procedure Laterality Date  . GOLD SEED IMPLANT N/A 02/23/2018   Procedure: GOLD SEED IMPLANT;  Surgeon: Cleon Gustin, MD;  Location: [facility];  Service: Urology;  Laterality: N/A;  . NO PAST SURGERIES    . PROSTATE BIOPSY  11-18-2017  dr Alyson Ingles  office  . SPACE OAR INSTILLATION N/A 02/23/2018   Procedure: SPACE OAR INSTILLATION;  Surgeon: Cleon Gustin, MD;  Location[facility];  Service: Urology;  Laterality: N/A;    FAMILY HISTORY: Family History  Problem Relation Age of Onset  . Hyperlipidemia Mother   . Hyperlipidemia Father   . Cancer Maternal Aunt        stomach    SOCIAL HISTORY: Social History   Socioeconomic History  . Marital status: Widowed    Spouse name: Not on file  . Number of  children: 8  . Years of education: 6th grade  . Highest education level: Not on file  Occupational History  . Not on file  Tobacco Use  . Smoking status: Never Smoker  . Smokeless tobacco: Never Used  Vaping Use  . Vaping Use: Never used  Substance and Sexual Activity  . Alcohol use: Not Currently    Comment: hx heavy alcohol  -- quit 1999  . Drug use: No  . Sexual activity: Yes  Other Topics Concern  . Not on file  Social History Narrative   He lives at home alone   Drinks 2 cups of coffee daily    Right handed   Social Determinants of Health   Financial Resource Strain:   . Difficulty of Paying Living Expenses: Not on file  Food Insecurity:   . Worried About Charity fundraiser in the Last Year: Not on file  . Ran Out of Food in the Last Year: Not on file  Transportation Needs:   . Lack of Transportation (Medical): Not on file  . Lack of Transportation (Non-Medical): Not on file  Physical Activity:   . Days of Exercise per Week: Not on file  . Minutes of Exercise per Session: Not on file  Stress:   . Feeling of Stress : Not on file  Social Connections:   . Frequency of Communication with Friends and Family: Not on file  . Frequency of Social Gatherings with Friends and Family: Not on file  . Attends Religious Services: Not on file  . Active Member of Clubs or Organizations: Not on file  . Attends Archivist Meetings: Not on file  . Marital Status: Not on file  Intimate Partner Violence:   . Fear of Current or Ex-Partner: Not on file  . Emotionally Abused: Not on file  .  Physically Abused: Not on file  . Sexually Abused: Not on file      PHYSICAL EXAM  Vitals:   05/11/20 0845  BP: 110/62  Pulse: (!) 58  Weight: 187 lb 12.8 oz (85.2 kg)  Height: 5\' 6"  (1.676 m)   Body mass index is 30.31 kg/m.  Generalized: Well developed, in no acute distress  Cardiology: normal rate and rhythm, no murmur noted Respiratory: clear to auscultation  bilaterally  Neurological examination  Mentation: Alert oriented to time, place, history taking. Follows all commands speech and language fluent Cranial nerve II-XII: Pupils were equal round reactive to light. Extraocular movements were full, visual field were full  Motor: The motor testing reveals 5 over 5 strength of all 4 extremities. Good symmetric motor tone is noted throughout.   Gait and station: Gait is normal.     DIAGNOSTIC DATA (LABS, IMAGING, TESTING) - I reviewed patient records, labs, notes, testing and imaging myself where available.  No flowsheet data found.   Lab Results  Component Value Date   WBC 3.3 (L) 11/05/2019   HGB 11.4 (L) 11/05/2019   HCT 34.8 (L) 11/05/2019   MCV 93 11/05/2019   PLT 213 11/05/2019      Component Value Date/Time   NA 140 11/05/2019 1540   K 4.4 11/05/2019 1540   CL 106 11/05/2019 1540   CO2 18 (L) 11/05/2019 1540   GLUCOSE 115 (H) 11/05/2019 1540   GLUCOSE 103 (H) 02/23/2018 1432   BUN 19 11/05/2019 1540   CREATININE 1.51 (H) 11/05/2019 1540   CREATININE 1.27 (H) 08/11/2017 1042   CALCIUM 8.8 11/05/2019 1540   PROT 6.6 11/05/2019 1540   ALBUMIN 4.3 11/05/2019 1540   AST 15 11/05/2019 1540   ALT 18 11/05/2019 1540   ALKPHOS 102 11/05/2019 1540   BILITOT 0.4 11/05/2019 1540   GFRNONAA 42 (L) 11/05/2019 1540   GFRNONAA 53 (L) 08/11/2017 1042   GFRAA 49 (L) 11/05/2019 1540   GFRAA 61 08/11/2017 1042   Lab Results  Component Value Date   CHOL 124 11/05/2019   HDL 50 11/05/2019   LDLCALC 45 11/05/2019   TRIG 175 (H) 11/05/2019   CHOLHDL 2.5 11/05/2019   Lab Results  Component Value Date   HGBA1C 6.1 (A) 11/05/2019   Lab Results  Component Value Date   VITAMINB12 1,161 11/05/2019   Lab Results  Component Value Date   TSH 1.960 11/05/2019       ASSESSMENT AND PLAN 85 y.o. year old male  has a past medical history of Arthritis, ED (erectile dysfunction), GERD (gastroesophageal reflux disease), Headache, History of  alcoholism ([place]), History of gout (09/2011), Hypercholesteremia, Hyperplasia of prostate with lower urinary tract symptoms (LUTS), Hypertension, OSA on CPAP (followed by dr Rexene Alberts), Pre-diabetes, Prostate cancer [facility]) (urologist-  dr Alyson Ingles  onologist-  dr Tammi Klippel), Seasonal allergic rhinitis, and Weak urinary stream. here with     ICD-10-CM   1. OSA on CPAP  G47.33 For home use only DME continuous positive airway pressure (CPAP)   Z99.89     He continues to work on compliance with CPAP therapy. He has increased compliance by 50% from last visit in 01/2020. He was encouraged to continue working towards using CPAP nightly and for greater than 4 hours each night.  Risk of untreated sleep apnea discussed.  He will continue close follow-up with primary care.  He will follow-up with [place] in 6 months, sooner if needed.  He verbalizes understanding and agreement with this plan.  Orders Placed This Encounter  Procedures  . For home use only DME continuous positive airway pressure (CPAP)    Supplies    Order Specific Question:   Length of Need    Answer:   Lifetime    Order Specific Question:   Patient has OSA or probable OSA    Answer:   Yes    Order Specific Question:   Is the patient currently using CPAP in the home    Answer:   Yes    Order Specific Question:   Settings    Answer:   Other see comments    Order Specific Question:   CPAP supplies needed    Answer:   Mask, headgear, cushions, filters, heated tubing and water chamber     No orders of the defined types were placed in this encounter.     I spent 15 minutes with the patient. 50% of this time was spent counseling and educating patient on plan of care and medications.    Debbora Presto, FNP-C 05/11/2020, 9:29 AM [facility] [address] [place], [place] [address] [phone]  I reviewed the above note and documentation by the Nurse Practitioner and agree with the history, exam, assessment and plan as  outlined above. I was available for consultation. Star Age, MD, PhD [facility])

## 2020-05-11 NOTE — Patient Instructions (Signed)
Please continue using your CPAP regularly. While your insurance requires that you use CPAP at least 4 hours each night on 70% of the nights, I recommend, that you not skip any nights and use it throughout the night if you can. Getting used to CPAP and staying with the treatment long term does take time and patience and discipline. Untreated obstructive sleep apnea when it is moderate to severe can have an adverse impact on cardiovascular health and raise her risk for heart disease, arrhythmias, hypertension, congestive heart failure, stroke and diabetes. Untreated obstructive sleep apnea causes sleep disruption, nonrestorative sleep, and sleep deprivation. This can have an impact on your day to day functioning and cause daytime sleepiness and impairment of cognitive function, memory loss, mood disturbance, and problems focussing. Using CPAP regularly can improve these symptoms.   Follow up in 6 months    Sleep Apnea Sleep apnea affects breathing during sleep. It causes breathing to stop for a short time or to become shallow. It can also increase the risk of:  Heart attack.  Stroke.  Being very overweight (obese).  Diabetes.  Heart failure.  Irregular heartbeat. The goal of treatment is to help you breathe normally again. What are the causes? There are three kinds of sleep apnea:  Obstructive sleep apnea. This is caused by a blocked or collapsed airway.  Central sleep apnea. This happens when the brain does not send the right signals to the muscles that control breathing.  Mixed sleep apnea. This is a combination of obstructive and central sleep apnea. The most common cause of this condition is a collapsed or blocked airway. This can happen if:  Your throat muscles are too relaxed.  Your tongue and tonsils are too large.  You are overweight.  Your airway is too small. What increases the risk?  Being overweight.  Smoking.  Having a small airway.  Being older.  Being  male.  Drinking alcohol.  Taking medicines to calm yourself (sedatives or tranquilizers).  Having family members with the condition. What are the signs or symptoms?  Trouble staying asleep.  Being sleepy or tired during the day.  Getting angry a lot.  Loud snoring.  Headaches in the morning.  Not being able to focus your mind (concentrate).  Forgetting things.  Less interest in sex.  Mood swings.  Personality changes.  Feelings of sadness (depression).  Waking up a lot during the night to pee (urinate).  Dry mouth.  Sore throat. How is this diagnosed?  Your medical history.  A physical exam.  A test that is done when you are sleeping (sleep study). The test is most often done in a sleep lab but may also be done at home. How is this treated?   Sleeping on your side.  Using a medicine to get rid of mucus in your nose (decongestant).  Avoiding the use of alcohol, medicines to help you relax, or certain pain medicines (narcotics).  Losing weight, if needed.  Changing your diet.  Not smoking.  Using a machine to open your airway while you sleep, such as: ? An oral appliance. This is a mouthpiece that shifts your lower jaw forward. ? A CPAP device. This device blows air through a mask when you breathe out (exhale). ? An EPAP device. This has valves that you put in each nostril. ? A BPAP device. This device blows air through a mask when you breathe in (inhale) and breathe out.  Having surgery if other treatments do not work. It   is important to get treatment for sleep apnea. Without treatment, it can lead to:  High blood pressure.  Coronary artery disease.  In men, not being able to have an erection (impotence).  Reduced thinking ability. Follow these instructions at home: Lifestyle  Make changes that your doctor recommends.  Eat a healthy diet.  Lose weight if needed.  Avoid alcohol, medicines to help you relax, and some pain  medicines.  Do not use any products that contain nicotine or tobacco, such as cigarettes, e-cigarettes, and chewing tobacco. If you need help quitting, ask your doctor. General instructions  Take over-the-counter and prescription medicines only as told by your doctor.  If you were given a machine to use while you sleep, use it only as told by your doctor.  If you are having surgery, make sure to tell your doctor you have sleep apnea. You may need to bring your device with you.  Keep all follow-up visits as told by your doctor. This is important. Contact a doctor if:  The machine that you were given to use during sleep bothers you or does not seem to be working.  You do not get better.  You get worse. Get help right away if:  Your chest hurts.  You have trouble breathing in enough air.  You have an uncomfortable feeling in your back, arms, or stomach.  You have trouble talking.  One side of your body feels weak.  A part of your face is hanging down. These symptoms may be an emergency. Do not wait to see if the symptoms will go away. Get medical help right away. Call your local emergency services (911 in the [place] Do not drive yourself to the hospital. Summary  This condition affects breathing during sleep.  The most common cause is a collapsed or blocked airway.  The goal of treatment is to help you breathe normally while you sleep. This information is not intended to replace advice given to you by your health care provider. Make sure you discuss any questions you have with your health care provider. Document Revised: 06/19/2018 Document Reviewed: 04/28/2018 Elsevier Patient Education  2020 [employer]  

## 2020-05-16 ENCOUNTER — Telehealth: Payer: Self-pay | Admitting: Family Medicine

## 2020-05-16 ENCOUNTER — Encounter: Payer: Self-pay | Admitting: Family Medicine

## 2020-05-16 ENCOUNTER — Other Ambulatory Visit: Payer: Self-pay

## 2020-05-16 ENCOUNTER — Ambulatory Visit (INDEPENDENT_AMBULATORY_CARE_PROVIDER_SITE_OTHER): Payer: Medicare Other | Admitting: Family Medicine

## 2020-05-16 VITALS — BP 117/68 | HR 75 | Temp 97.0°F | Resp 16 | Ht 66.0 in

## 2020-05-16 DIAGNOSIS — R102 Pelvic and perineal pain unspecified side: Secondary | ICD-10-CM

## 2020-05-16 DIAGNOSIS — I10 Essential (primary) hypertension: Secondary | ICD-10-CM

## 2020-05-16 DIAGNOSIS — Z202 Contact with and (suspected) exposure to infections with a predominantly sexual mode of transmission: Secondary | ICD-10-CM

## 2020-05-16 DIAGNOSIS — R3912 Poor urinary stream: Secondary | ICD-10-CM | POA: Diagnosis not present

## 2020-05-16 DIAGNOSIS — Z8546 Personal history of malignant neoplasm of prostate: Secondary | ICD-10-CM

## 2020-05-16 DIAGNOSIS — Z09 Encounter for follow-up examination after completed treatment for conditions other than malignant neoplasm: Secondary | ICD-10-CM

## 2020-05-16 DIAGNOSIS — R300 Dysuria: Secondary | ICD-10-CM

## 2020-05-16 LAB — POCT URINALYSIS DIPSTICK
Glucose, UA: NEGATIVE
Ketones, UA: NEGATIVE
Leukocytes, UA: NEGATIVE
Nitrite, UA: NEGATIVE
Protein, UA: POSITIVE — AB
Spec Grav, UA: >=1.03 — AB (ref 1.010–1.025)
Urobilinogen, UA: 1 E.U./dL
pH, UA: 5.5 (ref 5.0–8.0)

## 2020-05-16 NOTE — Progress Notes (Signed)
Patient [facility]   Established Patient Office Visit  Subjective:  Patient ID: Jorge Martin, male    DOB: 1935-05-15  Age: 85 y.o. MRN: [id number]  CC:  Chief Complaint  Patient presents with  . Follow-up  . Leg Pain    Pt states in the morning he can hardly get around.    HPI Jorge Martin is a 85 year old male who presents for Follow Up today.    Patient Active Problem List   Diagnosis Date Noted  . Pain in pelvis 01/14/2020  . Arthralgia of hip 01/14/2020  . Vitamin D deficiency 01/14/2020  . Weak urinary stream 11/07/2019  . Burning with urination 11/07/2019  . OSA on CPAP 10/21/2018  . Malignant neoplasm of prostate ([place]) 12/24/2017  . HTN (hypertension) 02/04/2017  . Acid reflux 02/04/2017  . Glaucoma 02/04/2017    Past Medical History:  Diagnosis Date  . Arthritis   . ED (erectile dysfunction)   . GERD (gastroesophageal reflux disease)   . Headache   . History of alcoholism ([place])    02-17-2018  per pt quit 1999  . History of gout 09/2011   right ankle  . Hypercholesteremia   . Hyperplasia of prostate with lower urinary tract symptoms (LUTS)   . Hypertension   . OSA on CPAP followed by dr Rexene Alberts   study 11-25-2017 moderate to severe osa (AHI 28.4/hr,  REM AHI 38/hr)  . Pre-diabetes   . Prostate cancer [facility]) urologist-  dr Alyson Ingles  onologist-  dr Tammi Klippel   dx 11-18-2017  Stage T2a,  Gleason 4+3,  PSA 16.90,  vol 124cc-- treatment plan external beam radiation  . Seasonal allergic rhinitis   . Weak urinary stream     Past Surgical History:  Procedure Laterality Date  . GOLD SEED IMPLANT N/A 02/23/2018   Procedure: GOLD SEED IMPLANT;  Surgeon: Cleon Gustin, MD;  Location: [facility];  Service: Urology;  Laterality: N/A;  . NO PAST SURGERIES    . PROSTATE BIOPSY  11-18-2017  dr Alyson Ingles  office  . SPACE OAR INSTILLATION N/A 02/23/2018   Procedure: SPACE OAR INSTILLATION;  Surgeon:  Cleon Gustin, MD;  Location: [facility];  Service: Urology;  Laterality: N/A;   Current Status: Since his last office visit, he is doing well with no complaints. He states that he continues to have chronic back and pelvic pain. He has history of Prostate Cancer where he has completed Radiation txs and continues to follow up with Urology as needed. He denies prostate swelling, tenderness, urinary frequency, dribble, discharge, urinary itching, burning, odor, hematuria, and suprapubic pain/discomfort. He denies fevers, chills, fatigue, recent infections, weight loss, and night sweats. He has not had any headaches, visual changes, dizziness, and falls. No chest pain, heart palpitations, cough and shortness of breath reported. Denies GI problems such as nausea, vomiting, diarrhea, and constipation. He has no reports of blood in stools.. No depression or anxiety reported today. He is taking all medications as prescribed. He denies pain today.   Family History  Problem Relation Age of Onset  . Hyperlipidemia Mother   . Hyperlipidemia Father   . Cancer Maternal Aunt        stomach    Social History   Socioeconomic History  . Marital status: Widowed    Spouse name: Not on file  . Number of children: 8  . Years of education: 6th grade  . Highest  education level: Not on file  Occupational History  . Not on file  Tobacco Use  . Smoking status: Never Smoker  . Smokeless tobacco: Never Used  Vaping Use  . Vaping Use: Never used  Substance and Sexual Activity  . Alcohol use: Not Currently    Comment: hx heavy alcohol  -- quit 1999  . Drug use: No  . Sexual activity: Yes  Other Topics Concern  . Not on file  Social History Narrative   He lives at home alone   Drinks 2 cups of coffee daily    Right handed   Social Determinants of Health   Financial Resource Strain:   . Difficulty of Paying Living Expenses: Not on file  Food Insecurity:   . Worried About Ship broker in the Last Year: Not on file  . Ran Out of Food in the Last Year: Not on file  Transportation Needs:   . Lack of Transportation (Medical): Not on file  . Lack of Transportation (Non-Medical): Not on file  Physical Activity:   . Days of Exercise per Week: Not on file  . Minutes of Exercise per Session: Not on file  Stress:   . Feeling of Stress : Not on file  Social Connections:   . Frequency of Communication with Friends and Family: Not on file  . Frequency of Social Gatherings with Friends and Family: Not on file  . Attends Religious Services: Not on file  . Active Member of Clubs or Organizations: Not on file  . Attends Archivist Meetings: Not on file  . Marital Status: Not on file  Intimate Partner Violence:   . Fear of Current or Ex-Partner: Not on file  . Emotionally Abused: Not on file  . Physically Abused: Not on file  . Sexually Abused: Not on file    Outpatient Medications Prior to Visit  Medication Sig Dispense Refill  . acetaminophen (TYLENOL) 500 MG tablet Take 1 tablet (500 mg total) by mouth in the morning and at bedtime. (May increase dose to 2 tablets (1,000 mg), 2 times a day if above dose is not effective). 60 tablet 6  . alfuzosin (UROXATRAL) 10 MG 24 hr tablet Take 1 tablet (10 mg total) by mouth 2 (two) times daily. 60 tablet 3  . amLODipine (NORVASC) 5 MG tablet TAKE 2 TABLETS BY MOUTH DAILY.----  takes in pm 90 tablet 0  . atorvastatin (LIPITOR) 20 MG tablet TAKE 1 TABLET(20 MG) BY MOUTH DAILY 90 tablet 1  . donepezil (ARICEPT) 10 MG tablet Take 10 mg by mouth at bedtime.    [name] esomeprazole (NEXIUM) 40 MG capsule TAKE 1 CAPSULE BY MOUTH DAILY BEFORE BREAKFAST. 90 capsule 1  . latanoprost (XALATAN) 0.005 % ophthalmic solution 1 drop at bedtime.    . solifenacin (VESICARE) 5 MG tablet Take 5 mg by mouth daily.    . Vitamin D, Ergocalciferol, (DRISDOL) 1.25 MG (50000 UNIT) CAPS capsule Take 1 capsule (50,000 Units total) by mouth every 7  (seven) days. 5 capsule 6   No facility-administered medications prior to visit.    No Known Allergies  ROS Review of Systems  Constitutional: Negative.   HENT: Negative.   Eyes: Negative.   Respiratory: Negative.   Cardiovascular: Negative.   Gastrointestinal: Positive for abdominal distention.  Endocrine: Negative.   Genitourinary: Positive for dysuria (occasional ) and urgency (occasional ).  Musculoskeletal: Negative.   Skin: Negative.   Allergic/Immunologic: Negative.   Neurological: Positive for dizziness (  occasional) and headaches (occasional ).  Hematological: Negative.   Psychiatric/Behavioral: Negative.    Objective:    Physical Exam Vitals and nursing note reviewed.  Constitutional:      Appearance: Normal appearance.  HENT:     Head: Normocephalic and atraumatic.     Nose: Nose normal.     Mouth/Throat:     Mouth: Mucous membranes are moist.     Pharynx: Oropharynx is clear.  Cardiovascular:     Rate and Rhythm: Normal rate and regular rhythm.     Pulses: Normal pulses.     Heart sounds: Normal heart sounds.  Pulmonary:     Effort: Pulmonary effort is normal.     Breath sounds: Normal breath sounds.  Abdominal:     General: Bowel sounds are normal.     Palpations: Abdomen is soft.  Musculoskeletal:        General: Normal range of motion.     Cervical back: Normal range of motion and neck supple.  Skin:    General: Skin is warm and dry.  Neurological:     General: No focal deficit present.     Mental Status: He is alert and oriented to person, place, and time.     Motor: Weakness (generalized) present.  Psychiatric:        Mood and Affect: Mood normal.        Behavior: Behavior normal.        Thought Content: Thought content normal.        Judgment: Judgment normal.     BP 117/68 (BP Location: Left Arm, Patient Position: Sitting, Cuff Size: Normal)   Pulse 75   Temp (!) 97 F (36.1 C)   Resp 16   SpO2 93%  Wt Readings from Last 3  Encounters:  05/11/20 187 lb 12.8 oz (85.2 kg)  02/03/20 185 lb (83.9 kg)  01/14/20 190 lb 3.2 oz (86.3 kg)     Health Maintenance Due  Topic Date Due  . COVID-19 Vaccine (1) Never done  . PNA vac Low Risk Adult (2 of 2 - PPSV23) 01/30/2018  . INFLUENZA VACCINE  04/16/2020    There are no preventive care reminders to display for this patient.  Lab Results  Component Value Date   TSH 1.960 11/05/2019   Lab Results  Component Value Date   WBC 3.3 (L) 11/05/2019   HGB 11.4 (L) 11/05/2019   HCT 34.8 (L) 11/05/2019   MCV 93 11/05/2019   PLT 213 11/05/2019   Lab Results  Component Value Date   NA 140 11/05/2019   K 4.4 11/05/2019   CO2 18 (L) 11/05/2019   GLUCOSE 115 (H) 11/05/2019   BUN 19 11/05/2019   CREATININE 1.51 (H) 11/05/2019   BILITOT 0.4 11/05/2019   ALKPHOS 102 11/05/2019   AST 15 11/05/2019   ALT 18 11/05/2019   PROT 6.6 11/05/2019   ALBUMIN 4.3 11/05/2019   CALCIUM 8.8 11/05/2019   ANIONGAP 11 07/25/2014   Lab Results  Component Value Date   CHOL 124 11/05/2019   Lab Results  Component Value Date   HDL 50 11/05/2019   Lab Results  Component Value Date   LDLCALC 45 11/05/2019   Lab Results  Component Value Date   TRIG 175 (H) 11/05/2019   Lab Results  Component Value Date   CHOLHDL 2.5 11/05/2019   Lab Results  Component Value Date   HGBA1C 6.1 (A) 11/05/2019    Assessment & Plan:   1. History of  prostate cancer Continue follow ups with Urology as needed.  2. Essential hypertension The current medical regimen is effective; blood pressure is stable at 117/68 today; continue present plan and medications as prescribed. He will continue to take medications as prescribed, to decrease high sodium intake, excessive alcohol intake, increase potassium intake, smoking cessation, and increase physical activity of at least 30 minutes of cardio activity daily. He will continue to follow Heart Healthy or DASH diet. - Urinalysis Dipstick  3. Pain  in pelvis Moderate.   4. Weak urinary stream  5. Dysuria Occasional   6. Possible exposure to STD We will re-evaluate symptoms.  - Chlamydia/Gonococcus/Trichomonas, NAA  7. Follow up He will follow up in 6 months.   No orders of the defined types were placed in this encounter.   Orders Placed This Encounter  Procedures  . Chlamydia/Gonococcus/Trichomonas, NAA  . Urinalysis Dipstick    Referral Orders  No referral(s) requested today    Kathe Becton,  MSN, FNP-BC [address], [place] [address] [phone]- fax   Problem List Items Addressed This Visit      Cardiovascular and Mediastinum   HTN (hypertension)   Relevant Orders   Urinalysis Dipstick (Completed)     Other   Pain in pelvis   Weak urinary stream    Other Visit Diagnoses    History of prostate cancer    -  Primary   Dysuria       Possible exposure to STD       Relevant Orders   Chlamydia/Gonococcus/Trichomonas, NAA (Completed)   Follow up          No orders of the defined types were placed in this encounter.   Follow-up: Return in about 6 months (around 11/13/2020).    Azzie Glatter, FNP

## 2020-05-16 NOTE — Progress Notes (Addendum)
CM sent to [facility] for new cpap order.  Got it! I have given this to [facility] for processing.

## 2020-05-18 ENCOUNTER — Other Ambulatory Visit: Payer: Self-pay | Admitting: Family Medicine

## 2020-05-18 ENCOUNTER — Encounter: Payer: Self-pay | Admitting: Family Medicine

## 2020-05-18 DIAGNOSIS — A599 Trichomoniasis, unspecified: Secondary | ICD-10-CM

## 2020-05-18 LAB — CHLAMYDIA/GONOCOCCUS/TRICHOMONAS, NAA
Chlamydia by NAA: NEGATIVE
Gonococcus by NAA: NEGATIVE
Trich vag by NAA: POSITIVE — AB

## 2020-05-18 MED ORDER — METRONIDAZOLE 500 MG PO TABS: 2000.0000 mg | ORAL_TABLET | Freq: Once | ORAL | 0 refills | Status: AC

## 2020-05-18 NOTE — Telephone Encounter (Signed)
-----   Message from Azzie Glatter, [place] sent at 05/18/2020  7:01 AM EDT ----- Regarding: "Positive for Trich" Patient is positive for Trich. This is his 2nd occurrence in 4 months. Please inform patient that it is important for 'partner' to be treated for Trichomoniasis also. He is to contact us if he needs additional dose for partner treatment. He is to refrain from sexual intercourse for at least 7 days. Thank you.

## 2020-05-18 NOTE — Progress Notes (Signed)
Pt as called about his positive for a STD and pt stated he understood and will pick up his medicine and will not have sex for the next 7 days and he will let his partner know.

## 2020-09-11 ENCOUNTER — Other Ambulatory Visit: Payer: Self-pay | Admitting: Family Medicine

## 2020-09-11 DIAGNOSIS — E559 Vitamin D deficiency, unspecified: Secondary | ICD-10-CM

## 2020-09-21 DIAGNOSIS — G4733 Obstructive sleep apnea (adult) (pediatric): Secondary | ICD-10-CM | POA: Diagnosis not present

## 2020-10-16 ENCOUNTER — Other Ambulatory Visit: Payer: Self-pay | Admitting: Family Medicine

## 2020-10-16 MED ORDER — ATORVASTATIN CALCIUM 20 MG PO TABS: ORAL_TABLET | ORAL | 3 refills | Status: AC

## 2020-10-16 NOTE — Telephone Encounter (Signed)
Please see patient refill request.

## 2020-11-13 ENCOUNTER — Ambulatory Visit (INDEPENDENT_AMBULATORY_CARE_PROVIDER_SITE_OTHER): Payer: Medicare Other | Admitting: Family Medicine

## 2020-11-13 ENCOUNTER — Ambulatory Visit: Payer: Medicare Other | Admitting: Family Medicine

## 2020-11-13 ENCOUNTER — Other Ambulatory Visit: Payer: Self-pay

## 2020-11-13 ENCOUNTER — Encounter: Payer: Self-pay | Admitting: Family Medicine

## 2020-11-13 VITALS — BP 131/66 | HR 79 | Temp 98.4°F | Ht 66.0 in | Wt 192.0 lb

## 2020-11-13 DIAGNOSIS — Z0000 Encounter for general adult medical examination without abnormal findings: Secondary | ICD-10-CM | POA: Diagnosis not present

## 2020-11-13 DIAGNOSIS — Z8546 Personal history of malignant neoplasm of prostate: Secondary | ICD-10-CM | POA: Diagnosis not present

## 2020-11-13 DIAGNOSIS — I10 Essential (primary) hypertension: Secondary | ICD-10-CM

## 2020-11-13 DIAGNOSIS — C61 Malignant neoplasm of prostate: Secondary | ICD-10-CM

## 2020-11-13 DIAGNOSIS — R3912 Poor urinary stream: Secondary | ICD-10-CM | POA: Diagnosis not present

## 2020-11-13 DIAGNOSIS — Z09 Encounter for follow-up examination after completed treatment for conditions other than malignant neoplasm: Secondary | ICD-10-CM | POA: Diagnosis not present

## 2020-11-13 DIAGNOSIS — Z789 Other specified health status: Secondary | ICD-10-CM

## 2020-11-13 DIAGNOSIS — R300 Dysuria: Secondary | ICD-10-CM | POA: Diagnosis not present

## 2020-11-13 DIAGNOSIS — R102 Pelvic and perineal pain unspecified side: Secondary | ICD-10-CM

## 2020-11-13 DIAGNOSIS — M1990 Unspecified osteoarthritis, unspecified site: Secondary | ICD-10-CM

## 2020-11-13 MED ORDER — ALFUZOSIN HCL ER 10 MG PO TB24: 10.0000 mg | ORAL_TABLET | Freq: Two times a day (BID) | ORAL | 3 refills | Status: DC

## 2020-11-13 MED ORDER — DICLOFENAC SODIUM 1 % EX GEL: 2.0000 g | Freq: Four times a day (QID) | CUTANEOUS | 6 refills | Status: DC

## 2020-11-13 NOTE — Progress Notes (Signed)
Patient [facility]   Annual Physical   Subjective:  Patient ID: Jorge Martin, male    DOB: 06-Aug-1935  Age: 86 y.o. MRN: [id number]  CC:  Chief Complaint  Patient presents with  . Follow-up    6 month follow up , pt has no new concerns , need form fill out for handicap     HPI Jorge Martin is a 86 year old male who presents for his Annual Physical today.  Patient Active Problem List   Diagnosis Date Noted  . Pain in pelvis 01/14/2020  . Arthralgia of hip 01/14/2020  . Vitamin D deficiency 01/14/2020  . Weak urinary stream 11/07/2019  . Burning with urination 11/07/2019  . OSA on CPAP 10/21/2018  . Malignant neoplasm of prostate ([place]) 12/24/2017  . HTN (hypertension) 02/04/2017  . Acid reflux 02/04/2017  . Glaucoma 02/04/2017   Current Status: Since his last office visit, he has c/o chronic Arthritis. He is doing well with no complaints. He continues to follow up with Urology as needed for history of Prostate Cancer. He continues to use CPAP qhs for sleep apnea. He denies fevers, chills, fatigue, recent infections, weight loss, and night sweats. He has not had any headaches, visual changes, dizziness, and falls. No chest pain, heart palpitations, cough and shortness of breath reported. Denies GI problems such as nausea, vomiting, diarrhea, and constipation. He has no reports of blood in stools, dysuria and hematuria. No depression or anxiety reported today. He is taking all medications as prescribed.  Past Medical History:  Diagnosis Date  . Arthritis   . ED (erectile dysfunction)   . GERD (gastroesophageal reflux disease)   . Headache   . History of alcoholism ([place])    02-17-2018  per pt quit 1999  . History of gout 09/2011   right ankle  . Hypercholesteremia   . Hyperplasia of prostate with lower urinary tract symptoms (LUTS)   . Hypertension   . OSA on CPAP followed by dr Rexene Alberts   study 11-25-2017 moderate to severe  osa (AHI 28.4/hr,  REM AHI 38/hr)  . Pre-diabetes   . Prostate cancer [facility]) urologist-  dr Alyson Ingles  onologist-  dr Tammi Klippel   dx 11-18-2017  Stage T2a,  Gleason 4+3,  PSA 16.90,  vol 124cc-- treatment plan external beam radiation  . Seasonal allergic rhinitis   . Weak urinary stream     Past Surgical History:  Procedure Laterality Date  . GOLD SEED IMPLANT N/A 02/23/2018   Procedure: GOLD SEED IMPLANT;  Surgeon: Cleon Gustin, MD;  Location: [facility];  Service: Urology;  Laterality: N/A;  . NO PAST SURGERIES    . PROSTATE BIOPSY  11-18-2017  dr Alyson Ingles  office  . SPACE OAR INSTILLATION N/A 02/23/2018   Procedure: SPACE OAR INSTILLATION;  Surgeon: Cleon Gustin, MD;  Location: [facility];  Service: Urology;  Laterality: N/A;    Family History  Problem Relation Age of Onset  . Hyperlipidemia Mother   . Hyperlipidemia Father   . Cancer Maternal Aunt        stomach    Social History   Socioeconomic History  . Marital status: Widowed    Spouse name: Not on file  . Number of children: 8  . Years of education: 6th grade  . Highest education level: Not on file  Occupational History  . Not on file  Tobacco Use  . Smoking status: Never Smoker  .  Smokeless tobacco: Never Used  Vaping Use  . Vaping Use: Never used  Substance and Sexual Activity  . Alcohol use: Not Currently    Comment: hx heavy alcohol  -- quit 1999  . Drug use: No  . Sexual activity: Yes  Other Topics Concern  . Not on file  Social History Narrative   He lives at home alone   Drinks 2 cups of coffee daily    Right handed   Social Determinants of Health   Financial Resource Strain: Not on file  Food Insecurity: Not on file  Transportation Needs: Not on file  Physical Activity: Not on file  Stress: Not on file  Social Connections: Not on file  Intimate Partner Violence: Not on file    Outpatient Medications Prior to Visit  Medication Sig Dispense Refill   . acetaminophen (TYLENOL) 500 MG tablet Take 1 tablet (500 mg total) by mouth in the morning and at bedtime. (May increase dose to 2 tablets (1,000 mg), 2 times a day if above dose is not effective). 60 tablet 6  . amLODipine (NORVASC) 5 MG tablet TAKE 2 TABLETS BY MOUTH DAILY.----  takes in pm 90 tablet 0  . atorvastatin (LIPITOR) 20 MG tablet Take 1 tablet by mouth every evening. 90 tablet 3  . donepezil (ARICEPT) 10 MG tablet Take 10 mg by mouth at bedtime.    [name] esomeprazole (NEXIUM) 40 MG capsule TAKE 1 CAPSULE BY MOUTH DAILY BEFORE BREAKFAST. 90 capsule 1  . latanoprost (XALATAN) 0.005 % ophthalmic solution 1 drop at bedtime.    . Vitamin D, Ergocalciferol, (DRISDOL) 1.25 MG (50000 UNIT) CAPS capsule TAKE 1 CAPSULE BY MOUTH EVERY 7 DAYS 5 capsule 6  . alfuzosin (UROXATRAL) 10 MG 24 hr tablet Take 1 tablet (10 mg total) by mouth 2 (two) times daily. 60 tablet 3  . solifenacin (VESICARE) 5 MG tablet Take 5 mg by mouth daily. (Patient not taking: Reported on 11/13/2020)     No facility-administered medications prior to visit.    No Known Allergies  ROS Review of Systems  Constitutional: Negative.   HENT: Negative.   Eyes: Negative.   Respiratory: Positive for shortness of breath (on exertion).   Cardiovascular: Negative.   Gastrointestinal: Positive for abdominal distention (obese).  Endocrine: Negative.   Genitourinary: Positive for dysuria, frequency and urgency.       Prostate cancer  Musculoskeletal: Positive for arthralgias (arthritis).  Skin: Negative.   Allergic/Immunologic: Negative.   Neurological: Positive for dizziness (occasional ) and headaches (occasional ).  Hematological: Negative.   Psychiatric/Behavioral: Negative.       Objective:    Physical Exam Vitals and nursing note reviewed.  Constitutional:      Appearance: Normal appearance.  HENT:     Head: Normocephalic and atraumatic.     Nose: Nose normal.     Mouth/Throat:     Mouth: Mucous membranes are  moist.  Cardiovascular:     Rate and Rhythm: Normal rate and regular rhythm.     Pulses: Normal pulses.     Heart sounds: Normal heart sounds.  Pulmonary:     Effort: Pulmonary effort is normal.     Breath sounds: Normal breath sounds.  Abdominal:     General: Bowel sounds are normal.     Palpations: Abdomen is soft.  Musculoskeletal:     Cervical back: Normal range of motion and neck supple.     Comments: Limited ROM in all extremities r/t age  Skin:  General: Skin is warm and dry.  Neurological:     General: No focal deficit present.     Mental Status: He is alert and oriented to person, place, and time.  Psychiatric:        Mood and Affect: Mood normal.        Behavior: Behavior normal.        Thought Content: Thought content normal.        Judgment: Judgment normal.     BP 131/66 (BP Location: Right Arm, Patient Position: Sitting, Cuff Size: Large)   Pulse 79   Temp 98.4 F (36.9 C) (Temporal)   Ht 5\' 6"  (1.676 m)   Wt 192 lb (87.1 kg)   SpO2 98%   BMI 30.99 kg/m  Wt Readings from Last 3 Encounters:  11/13/20 192 lb (87.1 kg)  05/11/20 187 lb 12.8 oz (85.2 kg)  02/03/20 185 lb (83.9 kg)     Health Maintenance Due  Topic Date Due  . COVID-19 Vaccine (1) Never done  . PNA vac Low Risk Adult (2 of 2 - PPSV23) 01/30/2018  . INFLUENZA VACCINE  04/16/2020    There are no preventive care reminders to display for this patient.  Lab Results  Component Value Date   TSH 1.960 11/05/2019   Lab Results  Component Value Date   WBC 3.3 (L) 11/05/2019   HGB 11.4 (L) 11/05/2019   HCT 34.8 (L) 11/05/2019   MCV 93 11/05/2019   PLT 213 11/05/2019   Lab Results  Component Value Date   NA 140 11/05/2019   K 4.4 11/05/2019   CO2 18 (L) 11/05/2019   GLUCOSE 115 (H) 11/05/2019   BUN 19 11/05/2019   CREATININE 1.51 (H) 11/05/2019   BILITOT 0.4 11/05/2019   ALKPHOS 102 11/05/2019   AST 15 11/05/2019   ALT 18 11/05/2019   PROT 6.6 11/05/2019   ALBUMIN 4.3  11/05/2019   CALCIUM 8.8 11/05/2019   ANIONGAP 11 07/25/2014   Lab Results  Component Value Date   CHOL 124 11/05/2019   Lab Results  Component Value Date   HDL 50 11/05/2019   Lab Results  Component Value Date   LDLCALC 45 11/05/2019   Lab Results  Component Value Date   TRIG 175 (H) 11/05/2019   Lab Results  Component Value Date   CHOLHDL 2.5 11/05/2019   Lab Results  Component Value Date   HGBA1C 6.1 (A) 11/05/2019   Assessment & Plan:   1. Annual physical exam Physical assessment within normal for age. Basic Neurology assessment normal.  Recommend monthly self testicular exam Recommend daily multivitamin for men Recommend strength training in 150 minutes of cardiovascular exercise per week as tolerated.  2. Essential hypertension The current medical regimen is effective; blood pressure is stable at 131/66 today; continue present plan and medications as prescribed. He will continue to take medications as prescribed, to decrease high sodium intake, excessive alcohol intake, increase potassium intake, smoking cessation, and increase physical activity of at least 30 minutes of cardio activity daily. He will continue to follow Heart Healthy or DASH diet.  3. History of prostate cancer Stable He will continue to follow up with Urology as needed.  - alfuzosin (UROXATRAL) 10 MG 24 hr tablet; Take 1 tablet (10 mg total) by mouth 2 (two) times daily.  Dispense: 60 tablet; Refill: 3  4. Pain in pelvis  5. Weak urinary stream  6. Dysuria - alfuzosin (UROXATRAL) 10 MG 24 hr tablet; Take 1 tablet (10  mg total) by mouth 2 (two) times daily.  Dispense: 60 tablet; Refill: 3  7. Memory intact Memory assessment exam passed with no problems.   8. Chronic arthritis - diclofenac Sodium (VOLTAREN) 1 % GEL; Apply 2 g topically 4 (four) times daily.  Dispense: 50 g; Refill: 6  9. Malignant neoplasm of prostate ([facility]) - alfuzosin (UROXATRAL) 10 MG 24 hr tablet; Take 1 tablet (10 mg  total) by mouth 2 (two) times daily.  Dispense: 60 tablet; Refill: 3  10. Health care maintenance - Vitamin B12 - Vitamin D, 25-hydroxy - CBC with Differential - Comprehensive metabolic panel - Lipid Panel - TSH - PSA - Urinalysis  11. Follow up He will follow up in 6 months.   Meds ordered this encounter  Medications  . diclofenac Sodium (VOLTAREN) 1 % GEL    Sig: Apply 2 g topically 4 (four) times daily.    Dispense:  50 g    Refill:  6  . alfuzosin (UROXATRAL) 10 MG 24 hr tablet    Sig: Take 1 tablet (10 mg total) by mouth 2 (two) times daily.    Dispense:  60 tablet    Refill:  3    Orders Placed This Encounter  Procedures  . Vitamin B12  . Vitamin D, 25-hydroxy  . CBC with Differential  . Comprehensive metabolic panel  . Lipid Panel  . TSH  . PSA  . Urinalysis    Referral Orders  No referral(s) requested today    Kathe Becton, MSN, ANE, FNP-BC [facility] [place], [place] [address] [phone]- fax   Problem List Items Addressed This Visit      Genitourinary   Malignant neoplasm of prostate ([place])   Relevant Medications   alfuzosin (UROXATRAL) 10 MG 24 hr tablet     Other   Pain in pelvis   Weak urinary stream    Other Visit Diagnoses    Annual physical exam    -  Primary   Essential hypertension       History of prostate cancer       Relevant Medications   alfuzosin (UROXATRAL) 10 MG 24 hr tablet   Dysuria       Relevant Medications   alfuzosin (UROXATRAL) 10 MG 24 hr tablet   Memory intact       Chronic arthritis       Relevant Medications   diclofenac Sodium (VOLTAREN) 1 % GEL   Health care maintenance       Relevant Orders   Vitamin B12   Vitamin D, 25-hydroxy   CBC with Differential   Comprehensive metabolic panel   Lipid Panel   TSH   PSA   Urinalysis   Follow up          Meds ordered this encounter   Medications  . diclofenac Sodium (VOLTAREN) 1 % GEL    Sig: Apply 2 g topically 4 (four) times daily.    Dispense:  50 g    Refill:  6  . alfuzosin (UROXATRAL) 10 MG 24 hr tablet    Sig: Take 1 tablet (10 mg total) by mouth 2 (two) times daily.    Dispense:  60 tablet    Refill:  3    Follow-up: No follow-ups on file.    Azzie Glatter, FNP

## 2020-11-14 DIAGNOSIS — R7989 Other specified abnormal findings of blood chemistry: Secondary | ICD-10-CM

## 2020-11-14 HISTORY — DX: Other specified abnormal findings of blood chemistry: R79.89

## 2020-11-14 LAB — LIPID PANEL
Chol/HDL Ratio: 2.8 ratio (ref 0.0–5.0)
Cholesterol, Total: 154 mg/dL (ref 100–199)
HDL: 55 mg/dL (ref >39)
LDL Chol Calc (NIH): 72 mg/dL (ref 0–99)
Triglycerides: 158 mg/dL — ABNORMAL HIGH (ref 0–149)
VLDL Cholesterol Cal: 27 mg/dL (ref 5–40)

## 2020-11-14 LAB — CBC WITH DIFFERENTIAL/PLATELET
Basophils Absolute: 0 10*3/uL (ref 0.0–0.2)
Basos: 1 %
EOS (ABSOLUTE): 0.2 10*3/uL (ref 0.0–0.4)
Eos: 5 %
Hematocrit: 38.6 % (ref 37.5–51.0)
Hemoglobin: 12.8 g/dL — ABNORMAL LOW (ref 13.0–17.7)
Immature Grans (Abs): 0 10*3/uL (ref 0.0–0.1)
Immature Granulocytes: 1 %
Lymphocytes Absolute: 1.3 10*3/uL (ref 0.7–3.1)
Lymphs: 34 %
MCH: 30 pg (ref 26.6–33.0)
MCHC: 33.2 g/dL (ref 31.5–35.7)
MCV: 91 fL (ref 79–97)
Monocytes Absolute: 0.4 10*3/uL (ref 0.1–0.9)
Monocytes: 10 %
Neutrophils Absolute: 2 10*3/uL (ref 1.4–7.0)
Neutrophils: 49 %
Platelets: 226 10*3/uL (ref 150–450)
RBC: 4.26 x10E6/uL (ref 4.14–5.80)
RDW: 14.1 % (ref 11.6–15.4)
WBC: 4 10*3/uL (ref 3.4–10.8)

## 2020-11-14 LAB — URINALYSIS
Bilirubin, UA: NEGATIVE
Glucose, UA: NEGATIVE
Leukocytes,UA: NEGATIVE
Nitrite, UA: NEGATIVE
RBC, UA: NEGATIVE
Specific Gravity, UA: >=1.03 — AB (ref 1.005–1.030)
Urobilinogen, Ur: 0.2 mg/dL (ref 0.2–1.0)
pH, UA: 6 (ref 5.0–7.5)

## 2020-11-14 LAB — COMPREHENSIVE METABOLIC PANEL
ALT: 20 IU/L (ref 0–44)
AST: 18 IU/L (ref 0–40)
Albumin/Globulin Ratio: 1.9 (ref 1.2–2.2)
Albumin: 4.5 g/dL (ref 3.6–4.6)
Alkaline Phosphatase: 86 IU/L (ref 44–121)
BUN/Creatinine Ratio: 13 (ref 10–24)
BUN: 23 mg/dL (ref 8–27)
Bilirubin Total: 0.6 mg/dL (ref 0.0–1.2)
CO2: 18 mmol/L — ABNORMAL LOW (ref 20–29)
Calcium: 9.3 mg/dL (ref 8.6–10.2)
Chloride: 106 mmol/L (ref 96–106)
Creatinine, Ser: 1.8 mg/dL — ABNORMAL HIGH (ref 0.76–1.27)
Globulin, Total: 2.4 g/dL (ref 1.5–4.5)
Glucose: 113 mg/dL — ABNORMAL HIGH (ref 65–99)
Potassium: 3.9 mmol/L (ref 3.5–5.2)
Sodium: 141 mmol/L (ref 134–144)
Total Protein: 6.9 g/dL (ref 6.0–8.5)
eGFR: 37 mL/min/{1.73_m2} — ABNORMAL LOW (ref >59)

## 2020-11-14 LAB — PSA: Prostate Specific Ag, Serum: 0.4 ng/mL (ref 0.0–4.0)

## 2020-11-14 LAB — TSH: TSH: 1.99 u[IU]/mL (ref 0.450–4.500)

## 2020-11-14 LAB — VITAMIN D 25 HYDROXY (VIT D DEFICIENCY, FRACTURES): Vit D, 25-Hydroxy: 68.9 ng/mL (ref 30.0–100.0)

## 2020-11-14 LAB — VITAMIN B12: Vitamin B-12: 954 pg/mL (ref 232–1245)

## 2020-11-20 ENCOUNTER — Ambulatory Visit: Payer: Medicare Other | Admitting: Family Medicine

## 2020-11-20 ENCOUNTER — Encounter: Payer: Self-pay | Admitting: Family Medicine

## 2020-11-20 ENCOUNTER — Other Ambulatory Visit: Payer: Self-pay | Admitting: Family Medicine

## 2020-11-20 DIAGNOSIS — R7989 Other specified abnormal findings of blood chemistry: Secondary | ICD-10-CM

## 2020-11-20 NOTE — Progress Notes (Deleted)
PATIENT: Jorge Martin DOB: 07/26/1935  REASON FOR VISIT: follow up HISTORY FROM: patient  No chief complaint on file.    HISTORY OF PRESENT ILLNESS: 11/20/20 ALL:    Compliance report dated 10/17/2020 through 11/15/2020 shows that he used CPAP 25/30 days for compliance of 83%. He used CPAP greater than 4 hours 17/30 days for compliance of 57%. Average usage was 6hr. Residual AHI was 0.9/hr on set pressure of 9cmH20 and EPR of 3. No significant leak noted.    05/11/2020 ALL:  Jorge Martin is a 86 y.o. male here today for follow up for OSA on CPAP therapy. He reports that he is doing well. He is getting more comfortable with therapy. He denies concerns with machine or supplies.   Compliance report dated 04/10/2020 through 05/09/2020 reveals that he used CPAP 18 of the past 30 days for compliance of 60%.  He used CPAP greater than 4 hours 14 of the last 30 days for compliance of 47%.  Average usage on days used was 5 hours and 59 minutes.  Residual AHI was 0.8 at a set pressure of 9 cm of water and EPR of 3.  There was no significant leak noted.  HISTORY: (copied from my note on 02/03/2020)  Jorge Martin is a 86 y.o. male here today for follow up for OSA on CPAP.  He reports that he is doing well on CPAP therapy.  He does feel better when he is using CPAP he has not had any difficulty with headaches.  He feels that he has more difficulty using CPAP if he is bothered by something.  Sometimes he lays in the bed and worries at night.  He admits that usage has been sporadic over the past 30 days.  He is motivated to continue using CPAP.  He understands risk factors of untreated sleep apnea.  Compliance report dated 01/03/2020 through 02/01/2020 reveals that he used CPAP 12 of the past 30 days for compliance of 40%.  He used CPAP greater than 4 hours 8 of the past 30 days for compliance of 27%.  Average usage on days used was 4 hours and 9 minutes.  Residual AHI was 2.3 on 9 cm of water and  an EPR of 3.  There was no significant leak noted.  HISTORY: (copied from my note on 11/03/2019)   Jorge Penna Harrisonis a 86 y.o.malehere today for follow up for OSA on CPAP. He reports that he is doing well overall. He does admit that he has not usedCPAP consistently over the past few months. He denies any difficulty with his machine. He has all the supplies he needs. He just states that there are days he does not want to use it. He states that he does not usually sleep for 4 hours each night. He does not nap during the day. He feels that he just does not need much sleep. He does continue to have persistent headaches. He feels that overall, headaches have improved. He does have about 2 mild headaches a week. These are easily treated with Tylenol.  Compliance report dated 08/04/2019 through 11/01/2019 reveals that he used CPAP 25 of the last 90 days. He used CPAP 15 of the last 90 days for greater than 4 hours for compliance of 17%. Average usage on days used was 4 hours and 27 minutes. Residual AHI was 1.5 on 9 cm of water and an EPR of 3. There was no significant leak noted.   HISTORY: (copied  frommynote on 10/28/2018)  Jorge Crysler Harrisonis a 86 y.o.malehere today for follow up. He has history of daily headaches and OSA.He reports that he is feeling well. He is using cpap regularly and feels benefit. Download reports shows that he is using CPAP 28/30 nights for compliance of 93%. He is only using CPAP greater than 4 hours 8/30 nights for compliance of 27%. His AHI is 0.8 on 9cm H2O with EPR of 3. There is no significant leak. He reports that he rarely sleeps for more than 3-4 hours at night. He has BPH and is up multiple times at night. He does states that he naps frequently during the day.  HISTORY: (copied fromDr Athar'snote on 04/01/2018)  Today, 04/01/2018: I reviewed his CPAP compliance data from 03/01/2018 through 03/30/2018, which is a total of 30 days, during which  time he used his CPAP 99 days with percent used days greater than 4 hours at 57%, indicating suboptimal compliance with an average usage of 4 hours and 27 minutes residual AHI at goal at 0.8 per hour, leak acceptable with the 95thpercentile at 13.2 L/m on a pressure of 10 cm with EPR of 2. In the past 90 days his compliance for more than 4 hours has been 42%. In the very first month of starting CPAP therapy his compliance was better, in fact between 02/05/2018 and 03/06/2018 his compliance for more than 4 hours was 90% which is excellent. He reports doing well. CPAP is working well for him.He reports an improvement in his sleep quality and daytime sleepiness. His headaches are better as well. He has had some issues tolerating the pressure and had some change in his humidity setting as moisture was accumulating in his nasal mask. Nevertheless, he struggles with nasal burning and has to take the mask off in the middle of the night, triestoputit back onlater.   REVIEW OF SYSTEMS: Out of a complete 14 system review of symptoms, the patient complains only of the following symptoms, none and all other reviewed systems are negative.   ALLERGIES: No Known Allergies  HOME MEDICATIONS: Outpatient Medications Prior to Visit  Medication Sig Dispense Refill  . acetaminophen (TYLENOL) 500 MG tablet Take 1 tablet (500 mg total) by mouth in the morning and at bedtime. (May increase dose to 2 tablets (1,000 mg), 2 times a day if above dose is not effective). 60 tablet 6  . alfuzosin (UROXATRAL) 10 MG 24 hr tablet Take 1 tablet (10 mg total) by mouth 2 (two) times daily. 60 tablet 3  . amLODipine (NORVASC) 5 MG tablet TAKE 2 TABLETS BY MOUTH DAILY.----  takes in pm 90 tablet 0  . atorvastatin (LIPITOR) 20 MG tablet Take 1 tablet by mouth every evening. 90 tablet 3  . diclofenac Sodium (VOLTAREN) 1 % GEL Apply 2 g topically 4 (four) times daily. 50 g 6  . donepezil (ARICEPT) 10 MG tablet Take 10 mg by mouth at  bedtime.    [name] esomeprazole (NEXIUM) 40 MG capsule TAKE 1 CAPSULE BY MOUTH DAILY BEFORE BREAKFAST. 90 capsule 1  . latanoprost (XALATAN) 0.005 % ophthalmic solution 1 drop at bedtime.    . solifenacin (VESICARE) 5 MG tablet Take 5 mg by mouth daily. (Patient not taking: Reported on 11/13/2020)    . Vitamin D, Ergocalciferol, (DRISDOL) 1.25 MG (50000 UNIT) CAPS capsule TAKE 1 CAPSULE BY MOUTH EVERY 7 DAYS 5 capsule 6   No facility-administered medications prior to visit.    PAST MEDICAL HISTORY: Past Medical History:  Diagnosis Date  . Arthritis   . ED (erectile dysfunction)   . GERD (gastroesophageal reflux disease)   . Headache   . History of alcoholism ([place])    02-17-2018  per pt quit 1999  . History of gout 09/2011   right ankle  . Hypercholesteremia   . Hyperplasia of prostate with lower urinary tract symptoms (LUTS)   . Hypertension   . OSA on CPAP followed by dr Rexene Alberts   study 11-25-2017 moderate to severe osa (AHI 28.4/hr,  REM AHI 38/hr)  . Pre-diabetes   . Prostate cancer [facility]) urologist-  dr Alyson Ingles  onologist-  dr Tammi Klippel   dx 11-18-2017  Stage T2a,  Gleason 4+3,  PSA 16.90,  vol 124cc-- treatment plan external beam radiation  . Seasonal allergic rhinitis   . Weak urinary stream     PAST SURGICAL HISTORY: Past Surgical History:  Procedure Laterality Date  . GOLD SEED IMPLANT N/A 02/23/2018   Procedure: GOLD SEED IMPLANT;  Surgeon: Cleon Gustin, MD;  [facility];  Service: Urology;  Laterality: N/A;  . NO PAST SURGERIES    . PROSTATE BIOPSY  11-18-2017  dr Alyson Ingles  office  . SPACE OAR INSTILLATION N/A 02/23/2018   Procedure: SPACE OAR INSTILLATION;  Surgeon: Cleon Gustin, MD;  Location[facility];  Service: Urology;  Laterality: N/A;    FAMILY HISTORY: Family History  Problem Relation Age of Onset  . Hyperlipidemia Mother   . Hyperlipidemia Father   . Cancer Maternal Aunt        stomach    SOCIAL  HISTORY: Social History   Socioeconomic History  . Marital status: Widowed    Spouse name: Not on file  . Number of children: 8  . Years of education: 6th grade  . Highest education level: Not on file  Occupational History  . Not on file  Tobacco Use  . Smoking status: Never Smoker  . Smokeless tobacco: Never Used  Vaping Use  . Vaping Use: Never used  Substance and Sexual Activity  . Alcohol use: Not Currently    Comment: hx heavy alcohol  -- quit 1999  . Drug use: No  . Sexual activity: Yes  Other Topics Concern  . Not on file  Social History Narrative   He lives at home alone   Drinks 2 cups of coffee daily    Right handed   Social Determinants of Health   Financial Resource Strain: Not on file  Food Insecurity: Not on file  Transportation Needs: Not on file  Physical Activity: Not on file  Stress: Not on file  Social Connections: Not on file  Intimate Partner Violence: Not on file      PHYSICAL EXAM  There were no vitals filed for this visit. There is no height or weight on file to calculate BMI.  Generalized: Well developed, in no acute distress  Cardiology: normal rate and rhythm, no murmur noted Respiratory: clear to auscultation bilaterally  Neurological examination  Mentation: Alert oriented to time, place, history taking. Follows all commands speech and language fluent Cranial nerve II-XII: Pupils were equal round reactive to light. Extraocular movements were full, visual field were full  Motor: The motor testing reveals 5 over 5 strength of all 4 extremities. Good symmetric motor tone is noted throughout.   Gait and station: Gait is normal.     DIAGNOSTIC DATA (LABS, IMAGING, TESTING) - I reviewed patient records, labs, notes, testing and imaging myself where  available.  No flowsheet data found.   Lab Results  Component Value Date   WBC 4.0 11/13/2020   HGB 12.8 (L) 11/13/2020   HCT 38.6 11/13/2020   MCV 91 11/13/2020   PLT 226 11/13/2020       Component Value Date/Time   NA 141 11/13/2020 0928   K 3.9 11/13/2020 0928   CL 106 11/13/2020 0928   CO2 18 (L) 11/13/2020 0928   GLUCOSE 113 (H) 11/13/2020 0928   GLUCOSE 103 (H) 02/23/2018 1432   BUN 23 11/13/2020 0928   CREATININE 1.80 (H) 11/13/2020 0928   CREATININE 1.27 (H) 08/11/2017 1042   CALCIUM 9.3 11/13/2020 0928   PROT 6.9 11/13/2020 0928   ALBUMIN 4.5 11/13/2020 0928   AST 18 11/13/2020 0928   ALT 20 11/13/2020 0928   ALKPHOS 86 11/13/2020 0928   BILITOT 0.6 11/13/2020 0928   GFRNONAA 42 (L) 11/05/2019 1540   GFRNONAA 53 (L) 08/11/2017 1042   GFRAA 49 (L) 11/05/2019 1540   GFRAA 61 08/11/2017 1042   Lab Results  Component Value Date   CHOL 154 11/13/2020   HDL 55 11/13/2020   LDLCALC 72 11/13/2020   TRIG 158 (H) 11/13/2020   CHOLHDL 2.8 11/13/2020   Lab Results  Component Value Date   HGBA1C 6.1 (A) 11/05/2019   Lab Results  Component Value Date   GYFVCBSW96 759 11/13/2020   Lab Results  Component Value Date   TSH 1.990 11/13/2020       ASSESSMENT AND PLAN 86 y.o. year old male  has a past medical history of Arthritis, ED (erectile dysfunction), GERD (gastroesophageal reflux disease), Headache, History of alcoholism ([place]), History of gout (09/2011), Hypercholesteremia, Hyperplasia of prostate with lower urinary tract symptoms (LUTS), Hypertension, OSA on CPAP (followed by dr Rexene Alberts), Pre-diabetes, Prostate cancer [facility]) (urologist-  dr Alyson Ingles  onologist-  dr Tammi Klippel), Seasonal allergic rhinitis, and Weak urinary stream. here with     ICD-10-CM   1. OSA on CPAP  G47.33    Z99.89      He continues to work on compliance with CPAP therapy. He has increased compliance from 60% to 83% daily usage and 47 to 57% four hour usage. He was encouraged to continue working towards using CPAP nightly and for greater than 4 hours each night.  Risk of untreated sleep apnea discussed.  He will continue close follow-up with primary care.  He will follow-up  with [place] in 6 months, sooner if needed.  He verbalizes understanding and agreement with this plan.   No orders of the defined types were placed in this encounter.    No orders of the defined types were placed in this encounter.     I spent 15 minutes with the patient. 50% of this time was spent counseling and educating patient on plan of care and medications.    Debbora Presto, FNP-C 11/20/2020, 10:42 AM [facility] [address], [place], [place] [address] [phone]

## 2021-01-02 DIAGNOSIS — H43812 Vitreous degeneration, left eye: Secondary | ICD-10-CM | POA: Diagnosis not present

## 2021-01-02 DIAGNOSIS — D631 Anemia in chronic kidney disease: Secondary | ICD-10-CM | POA: Diagnosis not present

## 2021-01-02 DIAGNOSIS — H401132 Primary open-angle glaucoma, bilateral, moderate stage: Secondary | ICD-10-CM | POA: Diagnosis not present

## 2021-01-02 DIAGNOSIS — N1832 Chronic kidney disease, stage 3b: Secondary | ICD-10-CM | POA: Diagnosis not present

## 2021-01-02 DIAGNOSIS — H5010 Unspecified exotropia: Secondary | ICD-10-CM | POA: Diagnosis not present

## 2021-01-02 DIAGNOSIS — E785 Hyperlipidemia, unspecified: Secondary | ICD-10-CM | POA: Diagnosis not present

## 2021-01-02 DIAGNOSIS — R809 Proteinuria, unspecified: Secondary | ICD-10-CM | POA: Diagnosis not present

## 2021-01-02 DIAGNOSIS — H02034 Senile entropion of left upper eyelid: Secondary | ICD-10-CM | POA: Diagnosis not present

## 2021-01-02 DIAGNOSIS — N2581 Secondary hyperparathyroidism of renal origin: Secondary | ICD-10-CM | POA: Diagnosis not present

## 2021-01-02 DIAGNOSIS — I129 Hypertensive chronic kidney disease with stage 1 through stage 4 chronic kidney disease, or unspecified chronic kidney disease: Secondary | ICD-10-CM | POA: Diagnosis not present

## 2021-01-02 DIAGNOSIS — H02054 Trichiasis without entropian left upper eyelid: Secondary | ICD-10-CM | POA: Diagnosis not present

## 2021-01-02 DIAGNOSIS — H25813 Combined forms of age-related cataract, bilateral: Secondary | ICD-10-CM | POA: Diagnosis not present

## 2021-01-03 ENCOUNTER — Other Ambulatory Visit: Payer: Self-pay | Admitting: Nephrology

## 2021-01-03 DIAGNOSIS — N1832 Chronic kidney disease, stage 3b: Secondary | ICD-10-CM

## 2021-01-12 ENCOUNTER — Telehealth: Payer: Self-pay

## 2021-01-12 NOTE — Telephone Encounter (Signed)
Med refill .  solisenacin 5 mg clotimazole cream

## 2021-01-15 NOTE — Telephone Encounter (Signed)
Called and spoke with we don't write that rx  for him he will need his urology

## 2021-01-17 ENCOUNTER — Ambulatory Visit
Admission: RE | Admit: 2021-01-17 | Discharge: 2021-01-17 | Disposition: A | Discharge status: Home / Self Care | Payer: Medicare Other | Source: Ambulatory Visit | Attending: Nephrology | Admitting: Nephrology

## 2021-01-17 ENCOUNTER — Telehealth: Payer: Self-pay

## 2021-01-17 DIAGNOSIS — N1832 Chronic kidney disease, stage 3b: Secondary | ICD-10-CM

## 2021-01-17 DIAGNOSIS — N281 Cyst of kidney, acquired: Secondary | ICD-10-CM | POA: Diagnosis not present

## 2021-01-17 DIAGNOSIS — N189 Chronic kidney disease, unspecified: Secondary | ICD-10-CM | POA: Diagnosis not present

## 2021-01-17 NOTE — Telephone Encounter (Signed)
Med refill Fungus medicine

## 2021-01-24 DIAGNOSIS — G4733 Obstructive sleep apnea (adult) (pediatric): Secondary | ICD-10-CM | POA: Diagnosis not present

## 2021-02-21 DIAGNOSIS — N1832 Chronic kidney disease, stage 3b: Secondary | ICD-10-CM | POA: Diagnosis not present

## 2021-02-21 DIAGNOSIS — Z7689 Persons encountering health services in other specified circumstances: Secondary | ICD-10-CM | POA: Diagnosis not present

## 2021-02-26 ENCOUNTER — Ambulatory Visit: Payer: Medicare Other | Admitting: Family Medicine

## 2021-02-26 ENCOUNTER — Encounter: Payer: Self-pay | Admitting: Family Medicine

## 2021-02-26 VITALS — BP 132/69 | HR 84 | Ht 66.0 in | Wt 192.8 lb

## 2021-02-26 DIAGNOSIS — Z9989 Dependence on other enabling machines and devices: Secondary | ICD-10-CM

## 2021-02-26 DIAGNOSIS — G4733 Obstructive sleep apnea (adult) (pediatric): Secondary | ICD-10-CM | POA: Diagnosis not present

## 2021-02-26 NOTE — Progress Notes (Addendum)
PATIENT: Jorge Martin DOB: Mar 02, 1935  REASON FOR VISIT: follow up HISTORY FROM: patient  Chief Complaint  Patient presents with   Follow-up    Rm 2, alone. CPAP f/u, pt reports doing well w/ cpap thrapy, no issues or concerns at this time.      HISTORY OF PRESENT ILLNESS: 02/26/2021 ALL:  He returns for follow up for OSA on CPAP. He reports that he is doing well. He continues to increase compliance. He is now 78% compliance with daily and 60% compliant with 4 hour use. He denies difficulty with machine or supplies. He states that he doesn't use it on nights when he has company over. He does feel that he sleeps better and wakes feeling rested when using CPAP.      05/11/2020 ALL:  Jorge Martin is a 86 y.o. male here today for follow up for OSA on CPAP therapy. He reports that he is doing well. He is getting more comfortable with therapy. He denies concerns with machine or supplies.   Compliance report dated 04/10/2020 through 05/09/2020 reveals that he used CPAP 18 of the past 30 days for compliance of 60%.  He used CPAP greater than 4 hours 14 of the last 30 days for compliance of 47%.  Average usage on days used was 5 hours and 59 minutes.  Residual AHI was 0.8 at a set pressure of 9 cm of water and EPR of 3.  There was no significant leak noted.  HISTORY: (copied from my note on 02/03/2020)  Jorge Martin is a 86 y.o. male here today for follow up for OSA on CPAP.  He reports that he is doing well on CPAP therapy.  He does feel better when he is using CPAP he has not had any difficulty with headaches.  He feels that he has more difficulty using CPAP if he is bothered by something.  Sometimes he lays in the bed and worries at night.  He admits that usage has been sporadic over the past 30 days.  He is motivated to continue using CPAP.  He understands risk factors of untreated sleep apnea.   Compliance report dated 01/03/2020 through 02/01/2020 reveals that he used CPAP 12 of  the past 30 days for compliance of 40%.  He used CPAP greater than 4 hours 8 of the past 30 days for compliance of 27%.  Average usage on days used was 4 hours and 9 minutes.  Residual AHI was 2.3 on 9 cm of water and an EPR of 3.  There was no significant leak noted.   HISTORY: (copied from my note on 11/03/2019)    Jorge Martin is a 86 y.o. male here today for follow up for OSA on CPAP. He reports that he is doing well overall. He does admit that he has not used CPAP consistently over the past few months.  He denies any difficulty with his machine.  He has all the supplies he needs.  He just states that there are days he does not want to use it.  He states that he does not usually sleep for 4 hours each night.  He does not nap during the day.  He feels that he just does not need much sleep.  He does continue to have persistent headaches.  He feels that overall, headaches have improved.  He does have about 2 mild headaches a week.  These are easily treated with Tylenol.   Compliance report dated 08/04/2019 through  11/01/2019 reveals that he used CPAP 25 of the last 90 days.  He used CPAP 15 of the last 90 days for greater than 4 hours for compliance of 17%.  Average usage on days used was 4 hours and 27 minutes.  Residual AHI was 1.5 on 9 cm of water and an EPR of 3.  There was no significant leak noted.     HISTORY: (copied from my note on 10/28/2018)   Jorge Martin is a 86 y.o. male here today for follow up. He has history of daily headaches and OSA. He reports that he is feeling well. He is using cpap regularly and feels benefit. Download reports shows that he is using CPAP 28/30 nights for compliance of 93%. He is only using CPAP greater than 4 hours 8/30 nights for compliance of 27%. His AHI is 0.8 on 9cm H2O with EPR of 3. There is no significant leak. He reports that he rarely sleeps for more than 3-4 hours at night. He has BPH and is up multiple times at night. He does states that he naps  frequently during the day.    HISTORY: (copied from Dr Guadelupe Sabin note on 04/01/2018)   Today, 04/01/2018: I reviewed his CPAP compliance data from 03/01/2018 through 03/30/2018, which is a total of 30 days, during which time he used his CPAP 99 days with percent used days greater than 4 hours at 57%, indicating suboptimal compliance with an average usage of 4 hours and 27 minutes residual AHI at goal at 0.8 per hour, leak acceptable with the 95th percentile at 13.2 L/m on a pressure of 10 cm with EPR of 2. In the past 90 days his compliance for more than 4 hours has been 42%. In the very first month of starting CPAP therapy his compliance was better, in fact between 02/05/2018 and 03/06/2018 his compliance for more than 4 hours was 90% which is excellent. He reports doing well. CPAP is working well for him. He reports an improvement in his sleep quality and daytime sleepiness. His headaches are better as well. He has had some issues tolerating the pressure and had some change in his humidity setting as moisture was accumulating in his nasal mask. Nevertheless, he struggles with nasal burning and has to take the mask off in the middle of the night, tries to put it back on later.   REVIEW OF SYSTEMS: Out of a complete 14 system review of symptoms, the patient complains only of the following symptoms, none and all other reviewed systems are negative.  ESS:8   ALLERGIES: No Known Allergies  HOME MEDICATIONS: Outpatient Medications Prior to Visit  Medication Sig Dispense Refill   acetaminophen (TYLENOL) 500 MG tablet Take 1 tablet (500 mg total) by mouth in the morning and at bedtime. (May increase dose to 2 tablets (1,000 mg), 2 times a day if above dose is not effective). 60 tablet 6   alfuzosin (UROXATRAL) 10 MG 24 hr tablet Take 1 tablet (10 mg total) by mouth 2 (two) times daily. 60 tablet 3   amLODipine (NORVASC) 5 MG tablet TAKE 2 TABLETS BY MOUTH DAILY.----  takes in pm 90 tablet 0    atorvastatin (LIPITOR) 20 MG tablet Take 1 tablet by mouth every evening. 90 tablet 3   diclofenac Sodium (VOLTAREN) 1 % GEL Apply 2 g topically 4 (four) times daily. 50 g 6   donepezil (ARICEPT) 10 MG tablet Take 10 mg by mouth at bedtime.     esomeprazole (  NEXIUM) 40 MG capsule TAKE 1 CAPSULE BY MOUTH DAILY BEFORE BREAKFAST. 90 capsule 1   latanoprost (XALATAN) 0.005 % ophthalmic solution 1 drop at bedtime.     solifenacin (VESICARE) 5 MG tablet Take 5 mg by mouth daily.     Vitamin D, Ergocalciferol, (DRISDOL) 1.25 MG (50000 UNIT) CAPS capsule TAKE 1 CAPSULE BY MOUTH EVERY 7 DAYS 5 capsule 6   No facility-administered medications prior to visit.    PAST MEDICAL HISTORY: Past Medical History:  Diagnosis Date   Arthritis    ED (erectile dysfunction)    Elevated serum creatinine 11/2020   GERD (gastroesophageal reflux disease)    Headache    History of alcoholism ([place])    02-17-2018  per pt quit 1999   History of gout 09/2011   right ankle   Hypercholesteremia    Hyperplasia of prostate with lower urinary tract symptoms (LUTS)    Hypertension    OSA on CPAP followed by dr Rexene Alberts   study 11-25-2017 moderate to severe osa (AHI 28.4/hr,  REM AHI 38/hr)   Pre-diabetes    Prostate cancer [facility]) urologist-  dr Alyson Ingles  onologist-  dr Tammi Klippel   dx 11-18-2017  Stage T2a,  Gleason 4+3,  PSA 16.90,  vol 124cc-- treatment plan external beam radiation   Seasonal allergic rhinitis    Weak urinary stream     PAST SURGICAL HISTORY: Past Surgical History:  Procedure Laterality Date   GOLD SEED IMPLANT N/A 02/23/2018   Procedure: GOLD SEED IMPLANT;  Surgeon: Cleon Gustin, MD;  Location: [facility];  Service: Urology;  Laterality: N/A;   NO PAST SURGERIES     PROSTATE BIOPSY  11-18-2017  dr Alyson Ingles  office   SPACE OAR INSTILLATION N/A 02/23/2018   Procedure: SPACE OAR INSTILLATION;  Surgeon: Cleon Gustin, MD;  Location: [facility];  Service:  Urology;  Laterality: N/A;    FAMILY HISTORY: Family History  Problem Relation Age of Onset   Hyperlipidemia Mother    Hyperlipidemia Father    Cancer Maternal Aunt        stomach    SOCIAL HISTORY: Social History   Socioeconomic History   Marital status: Widowed    Spouse name: Not on file   Number of children: 8   Years of education: 6th grade   Highest education level: Not on file  Occupational History   Not on file  Tobacco Use   Smoking status: Never   Smokeless tobacco: Never  Vaping Use   Vaping Use: Never used  Substance and Sexual Activity   Alcohol use: Not Currently    Comment: hx heavy alcohol  -- quit 1999   Drug use: No   Sexual activity: Yes  Other Topics Concern   Not on file  Social History Narrative   He lives at home alone   Drinks 2 cups of coffee daily    Right handed   Social Determinants of Health   Financial Resource Strain: Not on file  Food Insecurity: Not on file  Transportation Needs: Not on file  Physical Activity: Not on file  Stress: Not on file  Social Connections: Not on file  Intimate Partner Violence: Not on file      PHYSICAL EXAM  Vitals:   02/26/21 1057  BP: 132/69  Pulse: 84  Weight: 192 lb 12.8 oz (87.5 kg)  Height: 5\' 6"  (1.676 m)   Body mass index is 31.12 kg/m.  Generalized: Well developed, in no  acute distress  Cardiology: normal rate and rhythm, no murmur noted Respiratory: clear to auscultation bilaterally  Neurological examination  Mentation: Alert oriented to time, place, history taking. Follows all commands speech and language fluent Cranial nerve II-XII: Pupils were equal round reactive to light. Extraocular movements were full, visual field were full  Motor: The motor testing reveals 5 over 5 strength of all 4 extremities. Good symmetric motor tone is noted throughout.   Gait and station: Gait is normal.     DIAGNOSTIC DATA (LABS, IMAGING, TESTING) - I reviewed patient records, labs, notes,  testing and imaging myself where available.  No flowsheet data found.   Lab Results  Component Value Date   WBC 4.0 11/13/2020   HGB 12.8 (L) 11/13/2020   HCT 38.6 11/13/2020   MCV 91 11/13/2020   PLT 226 11/13/2020      Component Value Date/Time   NA 141 11/13/2020 0928   K 3.9 11/13/2020 0928   CL 106 11/13/2020 0928   CO2 18 (L) 11/13/2020 0928   GLUCOSE 113 (H) 11/13/2020 0928   GLUCOSE 103 (H) 02/23/2018 1432   BUN 23 11/13/2020 0928   CREATININE 1.80 (H) 11/13/2020 0928   CREATININE 1.27 (H) 08/11/2017 1042   CALCIUM 9.3 11/13/2020 0928   PROT 6.9 11/13/2020 0928   ALBUMIN 4.5 11/13/2020 0928   AST 18 11/13/2020 0928   ALT 20 11/13/2020 0928   ALKPHOS 86 11/13/2020 0928   BILITOT 0.6 11/13/2020 0928   GFRNONAA 42 (L) 11/05/2019 1540   GFRNONAA 53 (L) 08/11/2017 1042   GFRAA 49 (L) 11/05/2019 1540   GFRAA 61 08/11/2017 1042   Lab Results  Component Value Date   CHOL 154 11/13/2020   HDL 55 11/13/2020   LDLCALC 72 11/13/2020   TRIG 158 (H) 11/13/2020   CHOLHDL 2.8 11/13/2020   Lab Results  Component Value Date   HGBA1C 6.1 (A) 11/05/2019   Lab Results  Component Value Date   TTSVXBLT90 300 11/13/2020   Lab Results  Component Value Date   TSH 1.990 11/13/2020       ASSESSMENT AND PLAN 86 y.o. year old male  has a past medical history of Arthritis, ED (erectile dysfunction), Elevated serum creatinine (11/2020), GERD (gastroesophageal reflux disease), Headache, History of alcoholism ([place]), History of gout (09/2011), Hypercholesteremia, Hyperplasia of prostate with lower urinary tract symptoms (LUTS), Hypertension, OSA on CPAP (followed by dr Rexene Alberts), Pre-diabetes, Prostate cancer [facility]) (urologist-  dr Alyson Ingles  onologist-  dr Tammi Klippel), Seasonal allergic rhinitis, and Weak urinary stream. here with     ICD-10-CM   1. OSA on CPAP  G47.33 For home use only DME continuous positive airway pressure (CPAP)   Z99.89        He continues to work on  compliance with CPAP therapy. He has increased compliance to 78% with daily and 60% with 4 hour use. He was encouraged to continue working towards using CPAP nightly and for greater than 4 hours each night.  Risk of untreated sleep apnea discussed.  He will continue close follow-up with primary care.  He will follow-up with [place] in 1 year, sooner if needed.  He verbalizes understanding and agreement with this plan.   Orders Placed This Encounter  Procedures   For home use only DME continuous positive airway pressure (CPAP)    Supplies    Order Specific Question:   Length of Need    Answer:   Lifetime    Order Specific Question:   Patient has  OSA or probable OSA    Answer:   Yes    Order Specific Question:   Is the patient currently using CPAP in the home    Answer:   Yes    Order Specific Question:   Settings    Answer:   Other see comments    Order Specific Question:   CPAP supplies needed    Answer:   Mask, headgear, cushions, filters, heated tubing and water chamber      No orders of the defined types were placed in this encounter.     Debbora Presto, FNP-C 02/26/2021, 11:19 AM [facility] [address] [place], [place] [address] [phone]  I reviewed the above note and documentation by the Nurse Practitioner and agree with the history, exam, assessment and plan as outlined above. I was available for consultation. Star Age, MD, PhD [facility])

## 2021-02-26 NOTE — Patient Instructions (Signed)
Please continue using your CPAP regularly. While your insurance requires that you use CPAP at least 4 hours each night on 70% of the nights, I recommend, that you not skip any nights and use it throughout the night if you can. Getting used to CPAP and staying with the treatment long term does take time and patience and discipline. Untreated obstructive sleep apnea when it is moderate to severe can have an adverse impact on cardiovascular health and raise her risk for heart disease, arrhythmias, hypertension, congestive heart failure, stroke and diabetes. Untreated obstructive sleep apnea causes sleep disruption, nonrestorative sleep, and sleep deprivation. This can have an impact on your day to day functioning and cause daytime sleepiness and impairment of cognitive function, memory loss, mood disturbance, and problems focussing. Using CPAP regularly can improve these symptoms.    Please continue working on meeting 4 hour compliance goal of 70%. Just focus on using it every night for at least 4 hours. Call me with any concerns.

## 2021-02-27 ENCOUNTER — Ambulatory Visit: Payer: Medicare Other | Admitting: Family Medicine

## 2021-02-27 NOTE — Progress Notes (Signed)
CM sent to [facility] 

## 2021-03-02 ENCOUNTER — Telehealth: Payer: Self-pay | Admitting: Nurse Practitioner

## 2021-03-02 ENCOUNTER — Other Ambulatory Visit: Payer: Self-pay | Admitting: Nurse Practitioner

## 2021-03-02 MED ORDER — AMLODIPINE BESYLATE 5 MG PO TABS: ORAL_TABLET | ORAL | 0 refills | Status: DC

## 2021-03-02 NOTE — Telephone Encounter (Signed)
Amodipine refilled. Patient aware.

## 2021-03-02 NOTE — Telephone Encounter (Signed)
Good morning, this patient have appt on 05/14/21 with Crystal. But is out of B/P meds. They want to know what to do. His  MRN: [id number]  3days left . Please call and let him know.  [facility] on [address]

## 2021-03-06 ENCOUNTER — Telehealth: Payer: Self-pay

## 2021-03-06 DIAGNOSIS — E785 Hyperlipidemia, unspecified: Secondary | ICD-10-CM | POA: Diagnosis not present

## 2021-03-06 DIAGNOSIS — D631 Anemia in chronic kidney disease: Secondary | ICD-10-CM | POA: Diagnosis not present

## 2021-03-06 DIAGNOSIS — N281 Cyst of kidney, acquired: Secondary | ICD-10-CM | POA: Diagnosis not present

## 2021-03-06 DIAGNOSIS — R809 Proteinuria, unspecified: Secondary | ICD-10-CM | POA: Diagnosis not present

## 2021-03-06 DIAGNOSIS — I129 Hypertensive chronic kidney disease with stage 1 through stage 4 chronic kidney disease, or unspecified chronic kidney disease: Secondary | ICD-10-CM | POA: Diagnosis not present

## 2021-03-06 DIAGNOSIS — N2581 Secondary hyperparathyroidism of renal origin: Secondary | ICD-10-CM | POA: Diagnosis not present

## 2021-03-06 DIAGNOSIS — N1832 Chronic kidney disease, stage 3b: Secondary | ICD-10-CM | POA: Diagnosis not present

## 2021-03-06 NOTE — Telephone Encounter (Signed)
Bp medicine  [facility] on [address]

## 2021-03-09 ENCOUNTER — Other Ambulatory Visit: Payer: Self-pay | Admitting: Nephrology

## 2021-03-09 DIAGNOSIS — N281 Cyst of kidney, acquired: Secondary | ICD-10-CM

## 2021-03-30 ENCOUNTER — Ambulatory Visit
Admission: RE | Admit: 2021-03-30 | Discharge: 2021-03-30 | Disposition: A | Discharge status: Home / Self Care | Payer: Medicare Other | Source: Ambulatory Visit | Attending: Nephrology | Admitting: Nephrology

## 2021-03-30 ENCOUNTER — Other Ambulatory Visit: Payer: Self-pay

## 2021-03-30 DIAGNOSIS — N281 Cyst of kidney, acquired: Secondary | ICD-10-CM

## 2021-03-30 MED ORDER — GADOBENATE DIMEGLUMINE 529 MG/ML IV SOLN
17.0000 mL | Freq: Once | INTRAVENOUS | Status: AC | PRN
  Administered 2021-03-30: 17 mL via INTRAVENOUS

## 2021-05-14 ENCOUNTER — Ambulatory Visit: Payer: Medicare Other | Admitting: Family Medicine

## 2021-05-14 ENCOUNTER — Ambulatory Visit: Payer: Medicare Other | Admitting: Nurse Practitioner

## 2021-06-01 ENCOUNTER — Telehealth: Payer: Self-pay

## 2021-06-01 NOTE — Telephone Encounter (Signed)
Unsuccessful call to patient, he is due for Medicare Annual Visit. No answer or voicemail to leave a message.

## 2022-01-08 ENCOUNTER — Encounter: Payer: Self-pay | Admitting: Family Medicine

## 2022-02-27 ENCOUNTER — Ambulatory Visit: Payer: Medicare Other | Admitting: Family Medicine

## 2022-04-10 IMAGING — MR MR ABDOMEN WO/W CM
15 series · 48 of 48 positions shown · IV contrast (17 ml multihance)
Comparison: Ultrasound 01/17/2021 and CT AP 12/10/2017.

CLINICAL DATA: Evaluate complex left kidney cyst.

EXAM:
MRI ABDOMEN WITHOUT AND WITH CONTRAST
TECHNIQUE: Multiplanar multisequence MR imaging of the abdomen was performed
both before and after the administration of intravenous contrast.
CONTRAST:  17mL MULTIHANCE GADOBENATE DIMEGLUMINE 529 MG/ML IV SOLN

[Series 3: T2 · coronal · 5.0mm · 1.56mm/px · 3 of 36 slices shown (1 of 3)]
[im 1/36]
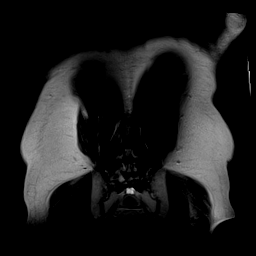
[im 18/36]
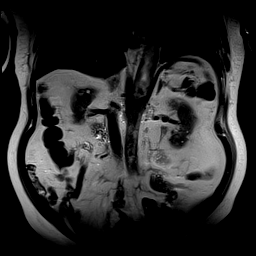
[im 36/36]
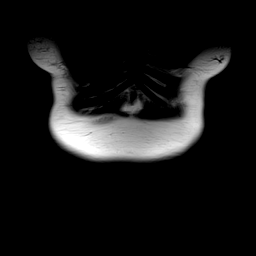

[Series 4: T1 · axial · 3.0mm · 1.19mm/px · z∈[+19,+225]mm · 8 of 144 slices shown]
[im 1/144]
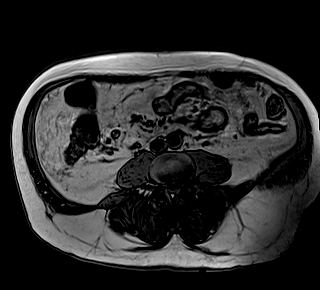
[im 21/144]
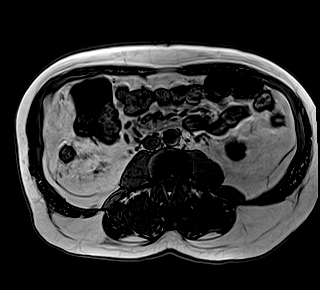
[im 41/144]
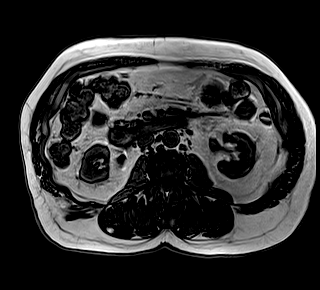
[im 62/144]
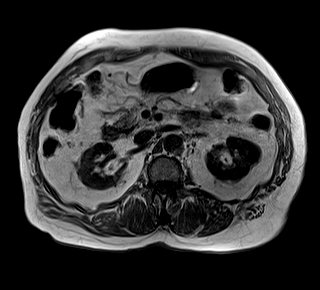
[im 82/144]
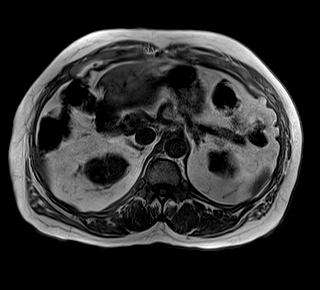
[im 103/144]
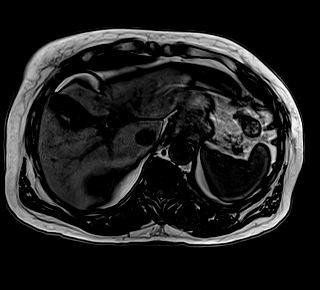
[im 123/144]
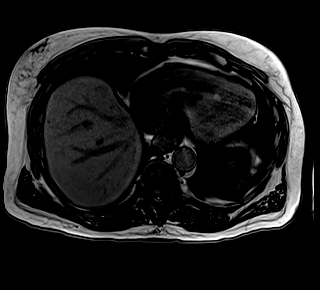
[im 144/144]
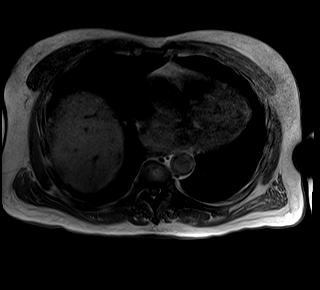

[Series 5: T2 · axial · 5.0mm · 1.48mm/px · z∈[-4,+213]mm · 3 of 38 slices shown (2 of 3)]
[im 1/38]
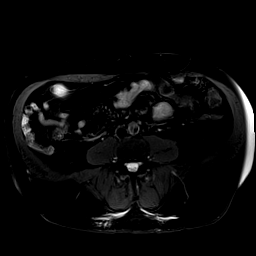
[im 19/38]
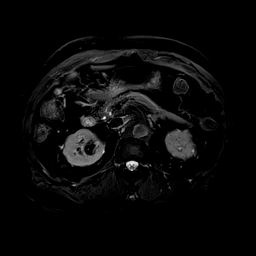
[im 38/38]
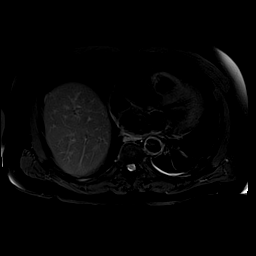

[Series 6: DWI · axial · 5.0mm · 1.42mm/px · z∈[-12,+206]mm · 5 of 114 slices shown (1 of 2)]
[im 1/114]
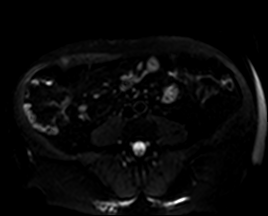
[im 29/114]
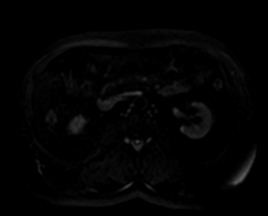
[im 57/114]
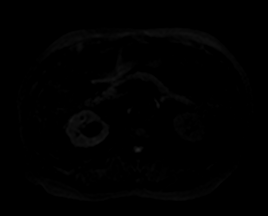
[im 85/114]
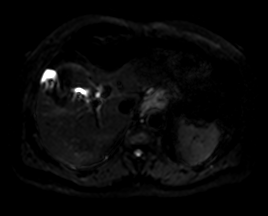
[im 114/114]
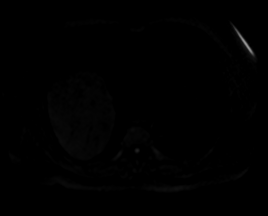

[Series 7: DWI · axial · 5.0mm · 1.42mm/px · z∈[-12,+206]mm · 2 of 38 slices shown (2 of 2)]
[im 1/38]
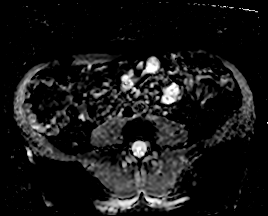
[im 38/38]
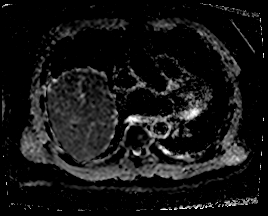

[Series 8: T2 · axial · 6.0mm · 1.22mm/px · 1 of 30 slices shown (3 of 3)]
[im 1/30]
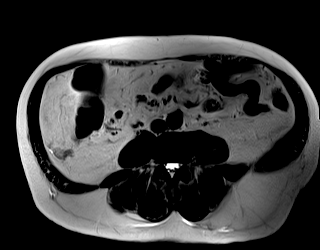

[Series 9: bSSFP · axial · 5.0mm · 1.25mm/px · z∈[+15,+229]mm · 2 of 38 slices shown]
[im 1/38]
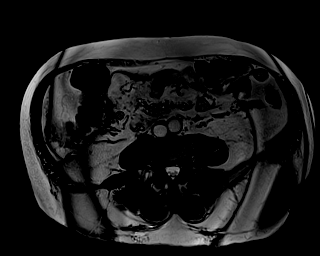
[im 38/38]
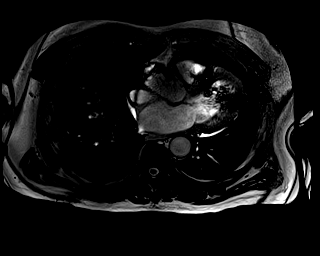

[Series 10: T1 dynamic · axial · non-contrast · 3.0mm · 1.25mm/px · z∈[+19,+225]mm · 3 of 72 slices shown]
[im 1/72]
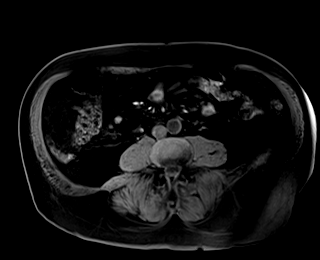
[im 36/72]
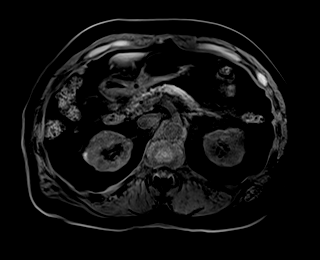
[im 72/72]
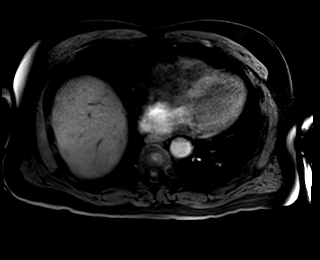

[Series 11: T1 dynamic post-contrast · axial · 3.0mm · 1.25mm/px · z∈[+19,+225]mm · 3 of 72 slices shown (1 of 7)]
[im 1/72]
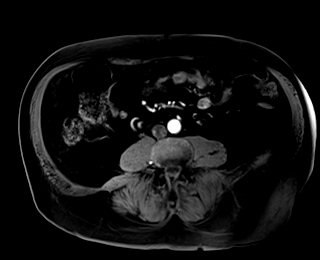
[im 36/72]
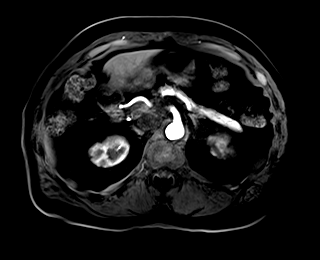
[im 72/72]
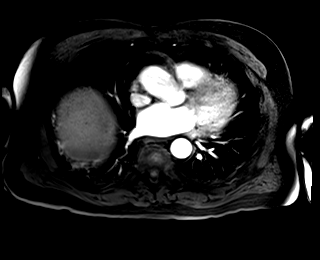

[Series 12: T1 dynamic post-contrast · axial · 3.0mm · 1.25mm/px · z∈[+19,+225]mm · 3 of 72 slices shown (2 of 7)]
[im 1/72]
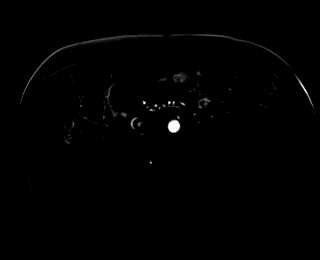
[im 36/72]
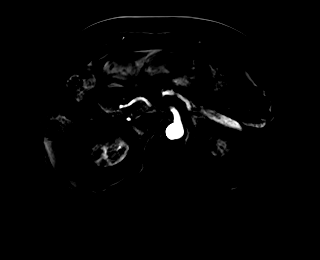
[im 72/72]
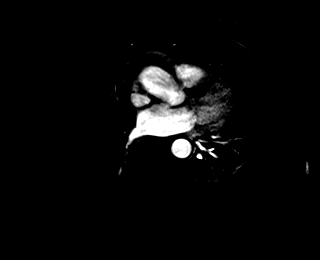

[Series 13: T1 dynamic post-contrast · axial · 3.0mm · 1.25mm/px · z∈[+19,+225]mm · 3 of 72 slices shown (3 of 7)]
[im 1/72]
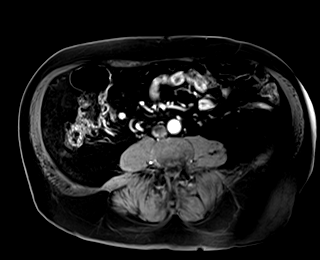
[im 36/72]
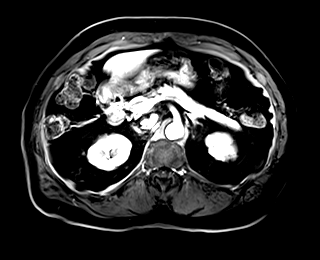
[im 72/72]
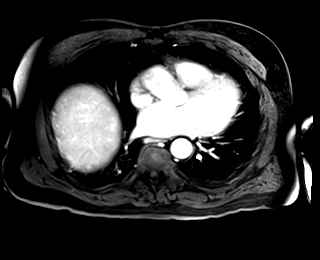

[Series 14: T1 dynamic post-contrast · axial · 3.0mm · 1.25mm/px · z∈[+19,+225]mm · 3 of 72 slices shown (4 of 7)]
[im 1/72]
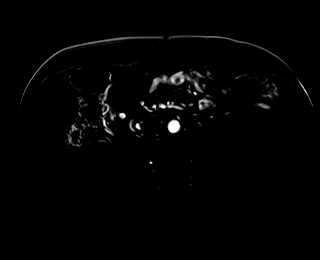
[im 36/72]
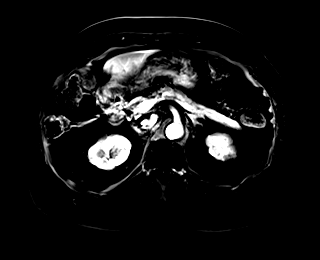
[im 72/72]
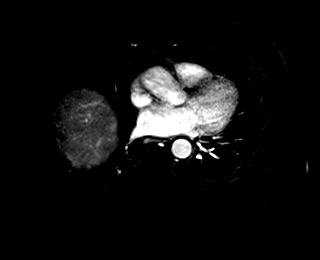

[Series 15: T1 dynamic post-contrast · axial · 3.0mm · 1.25mm/px · z∈[+19,+225]mm · 3 of 72 slices shown (5 of 7)]
[im 1/72]
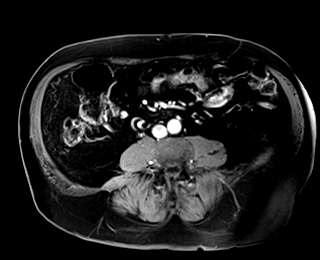
[im 36/72]
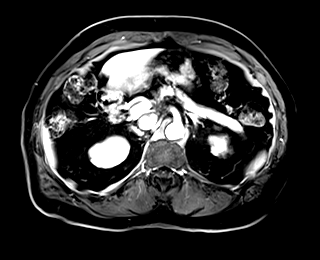
[im 72/72]
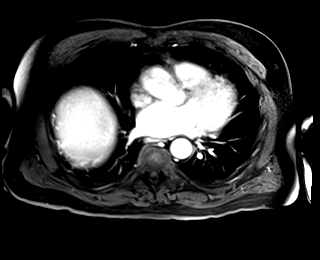

[Series 16: T1 dynamic post-contrast · axial · 3.0mm · 1.25mm/px · z∈[+19,+225]mm · 3 of 72 slices shown (6 of 7)]
[im 1/72]
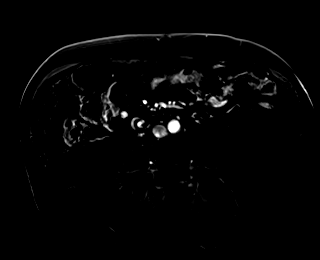
[im 36/72]
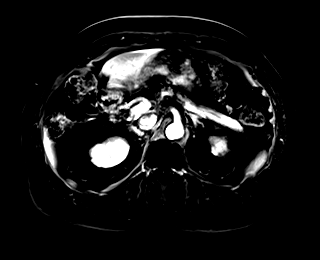
[im 72/72]
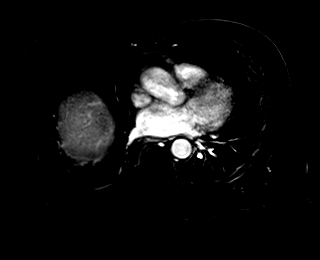

[Series 17: T1 dynamic post-contrast · coronal · 3.0mm · 1.25mm/px · 3 of 72 slices shown (7 of 7)]
[im 1/72]
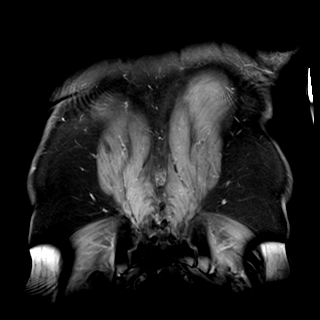
[im 36/72]
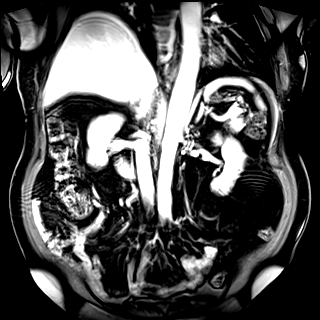
[im 72/72]
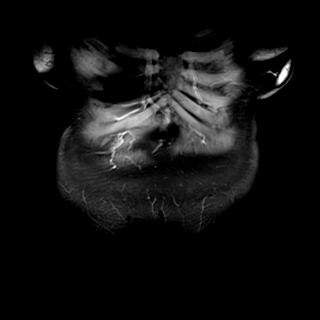

[48 of 48 positions shown; findings below may reference images not displayed]

FINDINGS: Lower chest: Trace pleural fluid identified overlying the posterior
left base.

Hepatobiliary: Mild hepatic steatosis. No suspicious enhancing liver
lesions identified. A few tiny millimetric cysts are noted. Multiple
gallstones are identified measuring up to 6 mm. No signs of
gallbladder wall inflammation. No bile duct dilatation.

Pancreas: No mass, inflammatory changes, or other parenchymal
abnormality identified.

Spleen:  Within normal limits in size and appearance.

Adrenals/Urinary Tract: Normal appearance of the adrenal glands.
Multiple tiny cysts are identified within both kidneys. These are
technically too small to reliably characterize measuring less than 1
cm. T1 hyperintense exophytic lesion arising off the lateral cortex
of the upper pole of right kidney is noted measuring 5 mm, image
35/10. This is also too small to characterize but favored to
represent a hemorrhagic cyst.

Corresponding to the ultrasound findings is a subcapsular lesion
arising off the inferior pole of the left kidney. This measures
x 2.2 cm, image [DATE]. No measurable enhancement identified within
this lesion. On the subtraction images no enhancing septation or
mural nodule identified.

No hydronephrosis identified bilaterally.

Stomach/Bowel: Visualized portions within the abdomen are
unremarkable.

Vascular/Lymphatic: Aortic atherosclerosis without aneurysm. No
adenopathy. Patent upper abdominal vasculature.

Other:  None.

Musculoskeletal: No suspicious bone lesions identified.
IMPRESSION: 1. The cystic lesion arising off the inferior pole of the left
kidney has signal and enhancement characteristics compatible with a
benign Bosniak category 1 cyst.
2. Additional, less than 1 cm cystic lesions are noted in both
kidneys which are too small to reliably characterize. There is also
a small subcentimeter hemorrhagic cyst arising off the right kidney.
Also too small to characterize.
3. Mild hepatic steatosis.
4. Cholelithiasis.

## 2022-04-30 ENCOUNTER — Encounter: Payer: Self-pay | Admitting: Family Medicine

## 2022-04-30 ENCOUNTER — Ambulatory Visit (INDEPENDENT_AMBULATORY_CARE_PROVIDER_SITE_OTHER): Payer: Medicare Other | Admitting: Family Medicine

## 2022-04-30 VITALS — BP 136/70 | HR 58 | Ht 66.0 in | Wt 174.0 lb

## 2022-04-30 DIAGNOSIS — Z9989 Dependence on other enabling machines and devices: Secondary | ICD-10-CM | POA: Diagnosis not present

## 2022-04-30 DIAGNOSIS — G4733 Obstructive sleep apnea (adult) (pediatric): Secondary | ICD-10-CM

## 2022-04-30 NOTE — Patient Instructions (Addendum)

## 2022-04-30 NOTE — Progress Notes (Signed)
PATIENT: Jorge Martin DOB: 24-Sep-1934  REASON FOR VISIT: follow up HISTORY FROM: patient  Chief Complaint  Patient presents with   Obstructive Sleep Apnea    EMG rm 3, alone. Here for yearly CPAP f/u. Pt reports doing well on CPAP. Has no issues or concerns.      HISTORY OF PRESENT ILLNESS:  04/30/2022 Jorge Martin Martin for follow up for OSA on CPAP. Jorge continues to do well on therapy. His only difficulty is with falling asleep before starting therapy. Jorge does note feeling better rested and more energized when using therapy. Jorge denies concerns with supplies or machine.   90 day compliance review shows 73% daily and 61% 4 hour usage. AHI 2.3/hr. No leak.     02/26/2021 ALL:  Jorge Martin for follow up for OSA on CPAP. Jorge reports that Jorge is doing well. Jorge continues to increase compliance. Jorge is now 78% compliance with daily and 60% compliant with 4 hour use. Jorge denies difficulty with machine or supplies. Jorge states that Jorge doesn't use it on nights when Jorge has company over. Jorge does feel that Jorge sleeps better and wakes feeling rested when using CPAP.      05/11/2020 ALL:  Jorge Martin is a 87 y.o. male here today for follow up for OSA on CPAP therapy. Jorge reports that Jorge is doing well. Jorge is getting more comfortable with therapy. Jorge denies concerns with machine or supplies.   Compliance report dated 04/10/2020 through 05/09/2020 reveals that Jorge used CPAP 18 of the past 30 days for compliance of 60%.  Jorge used CPAP greater than 4 hours 14 of the last 30 days for compliance of 47%.  Average usage on days used was 5 hours and 59 minutes.  Residual AHI was 0.8 at a set pressure of 9 cm of water and EPR of 3.  There was no significant leak noted.  HISTORY: (copied from my note on 02/03/2020)  Jorge Martin is a 87 y.o. male here today for follow up for OSA on CPAP.  Jorge reports that Jorge is doing well on CPAP therapy.  Jorge does feel better when Jorge is using CPAP Jorge has not had any  difficulty with headaches.  Jorge feels that Jorge has more difficulty using CPAP if Jorge is bothered by something.  Sometimes Jorge lays in the bed and worries at night.  Jorge admits that usage has been sporadic over the past 30 days.  Jorge is motivated to continue using CPAP.  Jorge understands risk factors of untreated sleep apnea.   Compliance report dated 01/03/2020 through 02/01/2020 reveals that Jorge used CPAP 12 of the past 30 days for compliance of 40%.  Jorge used CPAP greater than 4 hours 8 of the past 30 days for compliance of 27%.  Average usage on days used was 4 hours and 9 minutes.  Residual AHI was 2.3 on 9 cm of water and an EPR of 3.  There was no significant leak noted.   HISTORY: (copied from my note on 11/03/2019)    Jorge Martin is a 87 y.o. male here today for follow up for OSA on CPAP. Jorge reports that Jorge is doing well overall. Jorge does admit that Jorge has not used CPAP consistently over the past few months.  Jorge denies any difficulty with his machine.  Jorge has all the supplies Jorge needs.  Jorge just states that there are days Jorge does not want to use it.  Jorge states that Jorge does not usually sleep for 4 hours each night.  Jorge does not nap during the day.  Jorge feels that Jorge just does not need much sleep.  Jorge does continue to have persistent headaches.  Jorge feels that overall, headaches have improved.  Jorge does have about 2 mild headaches a week.  These are easily treated with Tylenol.   Compliance report dated 08/04/2019 through 11/01/2019 reveals that Jorge used CPAP 25 of the last 90 days.  Jorge used CPAP 15 of the last 90 days for greater than 4 hours for compliance of 17%.  Average usage on days used was 4 hours and 27 minutes.  Residual AHI was 1.5 on 9 cm of water and an EPR of 3.  There was no significant leak noted.   HISTORY: (copied from my note on 10/28/2018)   Jorge Martin is a 87 y.o. male here today for follow up. Jorge has history of daily headaches and OSA. Jorge reports that Jorge is feeling well. Jorge is using  cpap regularly and feels benefit. Download reports shows that Jorge is using CPAP 28/30 nights for compliance of 93%. Jorge is only using CPAP greater than 4 hours 8/30 nights for compliance of 27%. His AHI is 0.8 on 9cm H2O with EPR of 3. There is no significant leak. Jorge reports that Jorge rarely sleeps for more than 3-4 hours at night. Jorge has BPH and is up multiple times at night. Jorge does states that Jorge naps frequently during the day.    HISTORY: (copied from Dr Guadelupe Sabin note on 04/01/2018)   Today, 04/01/2018: I reviewed his CPAP compliance data from 03/01/2018 through 03/30/2018, which is a total of 30 days, during which time Jorge used his CPAP 99 days with percent used days greater than 4 hours at 57%, indicating suboptimal compliance with an average usage of 4 hours and 27 minutes residual AHI at goal at 0.8 per hour, leak acceptable with the 95th percentile at 13.2 L/m on a pressure of 10 cm with EPR of 2. In the past 90 days his compliance for more than 4 hours has been 42%. In the very first month of starting CPAP therapy his compliance was better, in fact between 02/05/2018 and 03/06/2018 his compliance for more than 4 hours was 90% which is excellent. Jorge reports doing well. CPAP is working well for him. Jorge reports an improvement in his sleep quality and daytime sleepiness. His headaches are better as well. Jorge has had some issues tolerating the pressure and had some change in his humidity setting as moisture was accumulating in his nasal mask. Nevertheless, Jorge struggles with nasal burning and has to take the mask off in the middle of the night, tries to put it back on later.   REVIEW OF SYSTEMS: Out of a complete 14 system review of symptoms, the patient complains only of the following symptoms, none and all other reviewed systems are negative.  ESS:8   ALLERGIES: No Known Allergies  HOME MEDICATIONS: Outpatient Medications Prior to Visit  Medication Sig Dispense Refill   acetaminophen (TYLENOL) 500  MG tablet Take 1 tablet (500 mg total) by mouth in the morning and at bedtime. (May increase dose to 2 tablets (1,000 mg), 2 times a day if above dose is not effective). 60 tablet 6   alfuzosin (UROXATRAL) 10 MG 24 hr tablet Take 1 tablet (10 mg total) by mouth 2 (two) times daily. 60 tablet 3   amLODipine (NORVASC) 5  MG tablet TAKE 2 TABLETS BY MOUTH DAILY.----  takes in pm 90 tablet 0   atorvastatin (LIPITOR) 20 MG tablet Take 1 tablet by mouth every evening. 90 tablet 3   diclofenac Sodium (VOLTAREN) 1 % GEL Apply 2 g topically 4 (four) times daily. 50 g 6   donepezil (ARICEPT) 10 MG tablet Take 10 mg by mouth at bedtime.     esomeprazole (NEXIUM) 40 MG capsule TAKE 1 CAPSULE BY MOUTH DAILY BEFORE BREAKFAST. 90 capsule 1   latanoprost (XALATAN) 0.005 % ophthalmic solution 1 drop at bedtime.     solifenacin (VESICARE) 5 MG tablet Take 5 mg by mouth daily.     Vitamin D, Ergocalciferol, (DRISDOL) 1.25 MG (50000 UNIT) CAPS capsule TAKE 1 CAPSULE BY MOUTH EVERY 7 DAYS 5 capsule 6   No facility-administered medications prior to visit.    PAST MEDICAL HISTORY: Past Medical History:  Diagnosis Date   Arthritis    ED (erectile dysfunction)    Elevated serum creatinine 11/2020   GERD (gastroesophageal reflux disease)    Headache    History of alcoholism ([place])    02-17-2018  per pt quit 1999   History of gout 09/2011   right ankle   Hypercholesteremia    Hyperplasia of prostate with lower urinary tract symptoms (LUTS)    Hypertension    OSA on CPAP followed by dr Rexene Alberts   study 11-25-2017 moderate to severe osa (AHI 28.4/hr,  REM AHI 38/hr)   Pre-diabetes    Prostate cancer [facility]) urologist-  dr Alyson Ingles  onologist-  dr Tammi Klippel   dx 11-18-2017  Stage T2a,  Gleason 4+3,  PSA 16.90,  vol 124cc-- treatment plan external beam radiation   Seasonal allergic rhinitis    Weak urinary stream     PAST SURGICAL HISTORY: Past Surgical History:  Procedure Laterality Date   GOLD SEED IMPLANT N/A  02/23/2018   Procedure: GOLD SEED IMPLANT;  Surgeon: Cleon Gustin, MD;  Location: [facility];  Service: Urology;  Laterality: N/A;   NO PAST SURGERIES     PROSTATE BIOPSY  11-18-2017  dr Alyson Ingles  office   SPACE OAR INSTILLATION N/A 02/23/2018   Procedure: SPACE OAR INSTILLATION;  Surgeon: Cleon Gustin, MD;  Location: [facility];  Service: Urology;  Laterality: N/A;    FAMILY HISTORY: Family History  Problem Relation Age of Onset   Hyperlipidemia Mother    Hyperlipidemia Father    Cancer Maternal Aunt        stomach    SOCIAL HISTORY: Social History   Socioeconomic History   Marital status: Widowed    Spouse name: Not on file   Number of children: 8   Years of education: 6th grade   Highest education level: Not on file  Occupational History   Not on file  Tobacco Use   Smoking status: Never   Smokeless tobacco: Never  Vaping Use   Vaping Use: Never used  Substance and Sexual Activity   Alcohol use: Not Currently    Comment: hx heavy alcohol  -- quit 1999   Drug use: No   Sexual activity: Yes  Other Topics Concern   Not on file  Social History Narrative   Jorge lives at home alone   Drinks 2 cups of coffee daily    Right handed   Social Determinants of Health   Financial Resource Strain: Not on file  Food Insecurity: Not on file  Transportation Needs: Not on file  Physical Activity: Not on file  Stress: Not on file  Social Connections: Not on file  Intimate Partner Violence: Not At Risk (06/12/2018)   Humiliation, Afraid, Rape, and Kick questionnaire    Fear of Current or Ex-Partner: No    Emotionally Abused: No    Physically Abused: No    Sexually Abused: No      PHYSICAL EXAM  Vitals:   04/30/22 1055  BP: 136/70  Pulse: (!) 58  Weight: 174 lb (78.9 kg)  Height: '5\' 6"'$  (1.676 m)    Body mass index is 28.08 kg/m.  Generalized: Well developed, in no acute distress  Cardiology: normal rate and rhythm, no  murmur noted Respiratory: clear to auscultation bilaterally  Neurological examination  Mentation: Alert oriented to time, place, history taking. Follows all commands speech and language fluent Cranial nerve II-XII: Pupils were equal round reactive to light. Extraocular movements were full, visual field were full  Motor: The motor testing reveals 5 over 5 strength of all 4 extremities. Good symmetric motor tone is noted throughout.   Gait and station: Gait is normal.     DIAGNOSTIC DATA (LABS, IMAGING, TESTING) - I reviewed patient records, labs, notes, testing and imaging myself where available.      No data to display           Lab Results  Component Value Date   WBC 4.0 11/13/2020   HGB 12.8 (L) 11/13/2020   HCT 38.6 11/13/2020   MCV 91 11/13/2020   PLT 226 11/13/2020      Component Value Date/Time   NA 141 11/13/2020 0928   K 3.9 11/13/2020 0928   CL 106 11/13/2020 0928   CO2 18 (L) 11/13/2020 0928   GLUCOSE 113 (H) 11/13/2020 0928   GLUCOSE 103 (H) 02/23/2018 1432   BUN 23 11/13/2020 0928   CREATININE 1.80 (H) 11/13/2020 0928   CREATININE 1.27 (H) 08/11/2017 1042   CALCIUM 9.3 11/13/2020 0928   PROT 6.9 11/13/2020 0928   ALBUMIN 4.5 11/13/2020 0928   AST 18 11/13/2020 0928   ALT 20 11/13/2020 0928   ALKPHOS 86 11/13/2020 0928   BILITOT 0.6 11/13/2020 0928   GFRNONAA 42 (L) 11/05/2019 1540   GFRNONAA 53 (L) 08/11/2017 1042   GFRAA 49 (L) 11/05/2019 1540   GFRAA 61 08/11/2017 1042   Lab Results  Component Value Date   CHOL 154 11/13/2020   HDL 55 11/13/2020   LDLCALC 72 11/13/2020   TRIG 158 (H) 11/13/2020   CHOLHDL 2.8 11/13/2020   Lab Results  Component Value Date   HGBA1C 6.1 (A) 11/05/2019   Lab Results  Component Value Date   UEAVWUJW11 914 11/13/2020   Lab Results  Component Value Date   TSH 1.990 11/13/2020       ASSESSMENT AND PLAN 87 y.o. year old male  has a past medical history of Arthritis, ED (erectile dysfunction), Elevated  serum creatinine (11/2020), GERD (gastroesophageal reflux disease), Headache, History of alcoholism ([place]), History of gout (09/2011), Hypercholesteremia, Hyperplasia of prostate with lower urinary tract symptoms (LUTS), Hypertension, OSA on CPAP (followed by dr Rexene Alberts), Pre-diabetes, Prostate cancer [facility]) (urologist-  dr Alyson Ingles  onologist-  dr Tammi Klippel), Seasonal allergic rhinitis, and Weak urinary stream. here with     ICD-10-CM   1. OSA on CPAP  G47.33 For home use only DME continuous positive airway pressure (CPAP)   Z99.89        Jorge continues to work on compliance with CPAP therapy. Compliance now 73%  with daily and 61% with 4 hour use over past 90 days. Jorge was encouraged to continue working towards using CPAP nightly and for greater than 4 hours each night.  Risk of untreated sleep apnea discussed.  Jorge will continue close follow-up with primary care.  Jorge will follow-up with [place] in 1 year, sooner if needed.  Jorge verbalizes understanding and agreement with this plan.   Orders Placed This Encounter  Procedures   For home use only DME continuous positive airway pressure (CPAP)    Supplies    Order Specific Question:   Length of Need    Answer:   Lifetime    Order Specific Question:   Patient has OSA or probable OSA    Answer:   Yes    Order Specific Question:   Is the patient currently using CPAP in the home    Answer:   Yes    Order Specific Question:   Settings    Answer:   Other see comments    Order Specific Question:   CPAP supplies needed    Answer:   Mask, headgear, cushions, filters, heated tubing and water chamber      No orders of the defined types were placed in this encounter.     Debbora Presto, FNP-C 04/30/2022, 11:43 AM [facility] [address] [place], [place] [address] [phone]

## 2022-04-30 NOTE — Progress Notes (Signed)
CM sent to [facility] for new order ?

## 2022-05-02 ENCOUNTER — Ambulatory Visit: Payer: Medicare Other | Admitting: Family Medicine

## 2022-05-31 ENCOUNTER — Emergency Department (HOSPITAL_COMMUNITY)
Admission: EM | Admit: 2022-05-31 | Discharge: 2022-05-31 | Discharge status: Against Medical Advice | Payer: Medicare Other | Attending: Emergency Medicine | Admitting: Emergency Medicine

## 2022-05-31 ENCOUNTER — Other Ambulatory Visit: Payer: Self-pay

## 2022-05-31 ENCOUNTER — Encounter: Payer: Self-pay | Admitting: Nephrology

## 2022-05-31 ENCOUNTER — Encounter (HOSPITAL_COMMUNITY): Payer: Self-pay

## 2022-05-31 DIAGNOSIS — Z5321 Procedure and treatment not carried out due to patient leaving prior to being seen by health care provider: Secondary | ICD-10-CM | POA: Diagnosis not present

## 2022-05-31 DIAGNOSIS — R319 Hematuria, unspecified: Secondary | ICD-10-CM | POA: Insufficient documentation

## 2022-05-31 DIAGNOSIS — R39198 Other difficulties with micturition: Secondary | ICD-10-CM | POA: Insufficient documentation

## 2022-05-31 DIAGNOSIS — Z8546 Personal history of malignant neoplasm of prostate: Secondary | ICD-10-CM | POA: Insufficient documentation

## 2022-05-31 LAB — URINALYSIS, ROUTINE W REFLEX MICROSCOPIC
Bilirubin Urine: NEGATIVE
Glucose, UA: NEGATIVE mg/dL
Ketones, ur: NEGATIVE mg/dL
Nitrite: NEGATIVE
Protein, ur: >=300 mg/dL — AB
RBC / HPF: >50 RBC/hpf — ABNORMAL HIGH (ref 0–5)
Specific Gravity, Urine: 1.03 (ref 1.005–1.030)
WBC, UA: >50 WBC/hpf — ABNORMAL HIGH (ref 0–5)
pH: 5 (ref 5.0–8.0)

## 2022-05-31 LAB — BASIC METABOLIC PANEL
Anion gap: 6 (ref 5–15)
BUN: 18 mg/dL (ref 8–23)
CO2: 21 mmol/L — ABNORMAL LOW (ref 22–32)
Calcium: 9 mg/dL (ref 8.9–10.3)
Chloride: 115 mmol/L — ABNORMAL HIGH (ref 98–111)
Creatinine, Ser: 1.63 mg/dL — ABNORMAL HIGH (ref 0.61–1.24)
GFR, Estimated: 41 mL/min — ABNORMAL LOW (ref >60)
Glucose, Bld: 117 mg/dL — ABNORMAL HIGH (ref 70–99)
Potassium: 3.8 mmol/L (ref 3.5–5.1)
Sodium: 142 mmol/L (ref 135–145)

## 2022-05-31 LAB — CBC WITH DIFFERENTIAL/PLATELET
Abs Immature Granulocytes: 0.02 10*3/uL (ref 0.00–0.07)
Basophils Absolute: 0 10*3/uL (ref 0.0–0.1)
Basophils Relative: 1 %
Eosinophils Absolute: 0.2 10*3/uL (ref 0.0–0.5)
Eosinophils Relative: 3 %
HCT: 36.2 % — ABNORMAL LOW (ref 39.0–52.0)
Hemoglobin: 11.6 g/dL — ABNORMAL LOW (ref 13.0–17.0)
Immature Granulocytes: 0 %
Lymphocytes Relative: 18 %
Lymphs Abs: 1 10*3/uL (ref 0.7–4.0)
MCH: 31.2 pg (ref 26.0–34.0)
MCHC: 32 g/dL (ref 30.0–36.0)
MCV: 97.3 fL (ref 80.0–100.0)
Monocytes Absolute: 0.4 10*3/uL (ref 0.1–1.0)
Monocytes Relative: 8 %
Neutro Abs: 4 10*3/uL (ref 1.7–7.7)
Neutrophils Relative %: 70 %
Platelets: 213 10*3/uL (ref 150–400)
RBC: 3.72 MIL/uL — ABNORMAL LOW (ref 4.22–5.81)
RDW: 14 % (ref 11.5–15.5)
WBC: 5.7 10*3/uL (ref 4.0–10.5)
nRBC: 0 % (ref 0.0–0.2)

## 2022-05-31 NOTE — ED Provider Triage Note (Signed)
Emergency Medicine Provider Triage Evaluation Note  Jorge Martin , a 87 y.o. male  was evaluated in triage.  Pt complains of intermittent bleeding with urination, difficulty urinating, burning with urination for 2 to 3 weeks.  Patient with history of enlarged prostate, prostate cancer.  He underwent radiation for prostate cancer sometime several years ago, daughter thinks maybe around 2016, did not have any chemotherapy or TURP procedure.  Patient with no previous history of urinary retention, no recurrent history of urinary tract infections, reports he has never had to be catheterized in the past.  Last passed some urine this morning around 7:30 AM but it was only a small quantity.  Patient reports significantly painful when attempting to pass urine, but no pain at rest.  Review of Systems  Positive: Difficulty urinating, hematuria Negative: Fever, chills, flank pain  Physical Exam  Ht '5\' 6"'$  (1.676 m)   Wt 77.1 kg   BMI 27.44 kg/m  Gen:   Awake, no distress   Resp:  Normal effort  MSK:   Moves extremities without difficulty  Other:  No TTP suprapubically or on flank  Medical Decision Making  Medically screening exam initiated at 11:09 AM.  Appropriate orders placed.  Newman Nip was informed that the remainder of the evaluation will be completed by another provider, this initial triage assessment does not replace that evaluation, and the importance of remaining in the ED until their evaluation is complete.  Workup initiated   Anselmo Pickler, [place] 05/31/22 1111

## 2022-05-31 NOTE — Progress Notes (Signed)
Patient seen and examined at our office for routine CKD3 care. Patient presented to the office today reporting a one week history of gross hematuria. He does report dysuria and passing clots intermittently. Does also report urinary urgency as well. Not on anticoagulation. No other symptoms. He does follow with [facility] for prostate cancer. Patient does want to go to the ER which I absolutely agree with therefore held off on any testing at our office. Advised him to proceed to [facility] ER. Discussed with ER Agricultural consultant. Pertinent labs from 05/27/2022: Cr 1.3, eGFR 54, Hgb 12.2.  Gean Quint, MD [facility]

## 2022-05-31 NOTE — ED Triage Notes (Signed)
Patient reports that he has had intermittent hematuria with clots x 3 weeks. Patient c/o burning when he urinates.

## 2022-06-01 LAB — URINE CULTURE: Culture: NO GROWTH

## 2023-04-15 ENCOUNTER — Telehealth: Payer: Self-pay | Admitting: Family Medicine

## 2023-04-15 ENCOUNTER — Encounter: Payer: Self-pay | Admitting: Family Medicine

## 2023-04-15 NOTE — Telephone Encounter (Signed)
LVM and sent letter in mail informing pt of need to reschedule 05/01/23 appt - NP out

## 2023-05-01 ENCOUNTER — Ambulatory Visit: Payer: Medicaid Other | Admitting: Family Medicine

## 2023-05-20 ENCOUNTER — Ambulatory Visit: Payer: 59 | Admitting: Family Medicine

## 2023-06-17 ENCOUNTER — Ambulatory Visit (INDEPENDENT_AMBULATORY_CARE_PROVIDER_SITE_OTHER): Payer: 59 | Admitting: Neurology

## 2023-06-17 ENCOUNTER — Encounter: Payer: Self-pay | Admitting: Neurology

## 2023-06-17 VITALS — BP 128/74 | HR 74 | Ht 66.0 in | Wt 168.0 lb

## 2023-06-17 DIAGNOSIS — G4733 Obstructive sleep apnea (adult) (pediatric): Secondary | ICD-10-CM

## 2023-06-17 DIAGNOSIS — Z9189 Other specified personal risk factors, not elsewhere classified: Secondary | ICD-10-CM

## 2023-06-17 NOTE — Progress Notes (Signed)
Subjective:    Patient ID: Jorge Martin is a 88 y.o. male.  HPI    Interim history:   Jorge Martin is an 88 year old male with an underlying medical history of hypertension, hyperlipidemia, glaucoma, prediabetes, reflux disease recurrent headaches and borderline obesity, who presents for follow-up consultation of his obstructive sleep apnea, well-established on CPAP therapy.  The patient is unaccompanied today and presents for his 1 year checkup.  He saw Shawnie Dapper, NP, on 04/30/2022, at which time he was slightly suboptimal with his compliance but still benefited from treatment.  He was motivated to continue with CPAP therapy.  Today, 06/17/2023: I reviewed his CPAP compliance data from 03/25/2023 through 04/23/2023, which is a total of 30 days, during which time he used his machine 28 days with percent use days greater than 4 hours at 87%, indicating very good compliance with an average usage of 7 hours and 11 minutes, residual AHI at goal at 2.4/h, leak acceptable with the 95th percentile at 13.4 L/min on a pressure of 9 cm with EPR of 3.  He reports doing well with his CPAP, no new concerns, he is UTD with his supplies.  He uses a nasal mask with good tolerance.  He has not seen an error message on his machine and is in favor of continuing with his current machine, not keen on getting a brand-new machine quite yet.  He has regular checkup with his primary care but has not seen a dentist on a regular basis.  His PCP is doing some testing as patient has had some appetite loss and some weight loss.  He does not recall his PCPs name, PCP is not listed in his electronic medical record.  He drives, reports no issues driving, drove himself to the appointment today, he lives alone.  His daughter checks his MyChart.  The patient's allergies, current medications, family history, past medical history, past social history, past surgical history and problem list were reviewed and updated as appropriate.    Previously:  He saw Shawnie Dapper, NP on 02/26/2021, at which time he reported benefiting from CPAP but was not fully compliant with it.  He is skipped occasional nights.  He saw Shawnie Dapper, NP on 05/11/2020, at which time he reported doing well, denied any significant headaches, he was not fully compliant with his CPAP at the time.  He saw Shawnie Dapper, NP, on 02/03/2020, at which time he was suboptimal with his CPAP compliance.  He was having a difficult time adjusting or using his CPAP.  He saw Shawnie Dapper, NP, on 11/03/2019, at which time he reported not being consistent with his CPAP, he was not fully compliant with treatment but motivated to be consistent.  He saw Shawnie Dapper, NP on 10/28/2018, at which time he was compliant with his CPAP usage but did not keep it on consistently for more than 4 hours.  04/01/2018 (SA): I reviewed his CPAP compliance data from 03/01/2018 through 03/30/2018, which is a total of 30 days, during which time he used his CPAP 99 days with percent used days greater than 4 hours at 57%, indicating suboptimal compliance with an average usage of 4 hours and 27 minutes residual AHI at goal at 0.8 per hour, leak acceptable with the 95th percentile at 13.2 L/m on a pressure of 10 cm with EPR of 2. In the past 90 days his compliance for more than 4 hours has been 42%. In the very first month of starting CPAP therapy his compliance  was better, in fact between 02/05/2018 and 03/06/2018 his compliance for more than 4 hours was 90% which is excellent. He reports doing well. CPAP is working well for him. He reports an improvement in his sleep quality and daytime sleepiness. His headaches are better as well. He has had some issues tolerating the pressure and had some change in his humidity setting as moisture was accumulating in his nasal mask. Nevertheless, he struggles with nasal burning and has to take the mask off in the middle of the night, tries to put it back on later.   I first met him  on 10/15/2017 at the request of Dr. Lucia Gaskins, at which time he reported snoring and daytime somnolence. He was advised to proceed with a sleep study. He had a baseline sleep study, followed by a CPAP titration study. I went over his test results with him in detail today. Baseline sleep study from 11/25/2017 showed a sleep latency of 19 minutes, REM latency was 64 minutes, sleep efficiency reduced at 57.8%. He had absence of slow-wave sleep, an increased percentage of stage II sleep and near normal percentage of REM sleep. Total AHI was 28.4 per hour, rising to 32.6 per hour during supine sleep and REM sleep AHI was 38 per hour. Average oxygen saturation was 93%, nadir was 76% with significant time below 89% saturation of over 1 hour. He had no significant PLMS. Based on his test results I suggested he proceed with a CPAP titration study. He had this on 12/29/2017. Sleep efficiency was 69.8%. Sleep latency was 32.5 minutes, REM latency 25.5 minutes. He had an increased percentage of slow-wave sleep and increased percentage of REM sleep, in keeping with rebound. CPAP was titrated via nasal cradle from 5 cm to 10 cm. On the final pressure his AHI was 0.3 per hour with brief supine REM sleep achieved an O2 nadir of 91%. He did not have any significant PLMS. Based on his test results I suggested a home CPAP treatment pressure of 10 cm.      10/15/2017: (He) reports snoring and daytime somnolence. I reviewed your office note from 09/02/2017. He reports having had a sleep study many years ago, results unknown. His Epworth sleepiness score is 13 out of 24 today, fatigue score is 33 out of 63. He is widowed and lives alone. He has 5 grandchildren. He has worked in [employer], currently works part-time in [employer]. His wife died 2 years ago. He was incarcerated for 5 years. He is a nonsmoker and does not drink alcohol currently, has a history of heavy drinking in the past by self-report, drinks caffeine in the form of  coffee, 2 cups per day on average. He does not have a Hx of migraine. No FHx of OSA. He has a bedtime of around 8:30 to 9. He has a TV on in the bedroom which tends to stay on all night. His rise time is around 3. He does have significant nocturia about 3 times per average night, he has had some morning headaches as well.   His Past Medical History Is Significant For: Past Medical History:  Diagnosis Date   Arthritis    ED (erectile dysfunction)    Elevated serum creatinine 11/2020   GERD (gastroesophageal reflux disease)    Headache    History of alcoholism (HCC)    02-17-2018  per pt quit 1999   History of gout 09/2011   right ankle   Hypercholesteremia    Hyperplasia of prostate with lower urinary  tract symptoms (LUTS)    Hypertension    OSA on CPAP followed by dr Frances Furbish   study 11-25-2017 moderate to severe osa (AHI 28.4/hr,  REM AHI 38/hr)   Pre-diabetes    Prostate cancer [facility]) urologist-  dr Ronne Binning  onologist-  dr Kathrynn Running   dx 11-18-2017  Stage T2a,  Gleason 4+3,  PSA 16.90,  vol 124cc-- treatment plan external beam radiation   Seasonal allergic rhinitis    Weak urinary stream     His Past Surgical History Is Significant For: Past Surgical History:  Procedure Laterality Date   GOLD SEED IMPLANT N/A 02/23/2018   Procedure: GOLD SEED IMPLANT;  Surgeon: Malen Gauze, MD;  Location: [facility];  Service: Urology;  Laterality: N/A;   NO PAST SURGERIES     PROSTATE BIOPSY  11-18-2017  dr Ronne Binning  office   SPACE OAR INSTILLATION N/A 02/23/2018   Procedure: SPACE OAR INSTILLATION;  Surgeon: Malen Gauze, MD;  Location: [facility];  Service: Urology;  Laterality: N/A;    His Family History Is Significant For: Family History  Problem Relation Age of Onset   Hyperlipidemia Mother    Hyperlipidemia Father    Cancer Maternal Aunt        stomach    His Social History Is Significant For: Social History   Socioeconomic History    Marital status: Widowed    Spouse name: Not on file   Number of children: 8   Years of education: 6th grade   Highest education level: Not on file  Occupational History   Not on file  Tobacco Use   Smoking status: Never   Smokeless tobacco: Never  Vaping Use   Vaping status: Never Used  Substance and Sexual Activity   Alcohol use: Not Currently    Comment: hx heavy alcohol  -- quit 1999   Drug use: No   Sexual activity: Yes  Other Topics Concern   Not on file  Social History Narrative   He lives at home alone   Drinks 2 cups of coffee daily    Right handed   Social Determinants of Health   Financial Resource Strain: Not on file  Food Insecurity: Not on file  Transportation Needs: Not on file  Physical Activity: Not on file  Stress: Not on file  Social Connections: Unknown (04/16/2023)   Received from [facility]   Social Network    Social Network: Not on file    His Allergies Are:  No Known Allergies:   His Current Medications Are:  Outpatient Encounter Medications as of 06/17/2023  Medication Sig   acetaminophen (TYLENOL) 500 MG tablet Take 1 tablet (500 mg total) by mouth in the morning and at bedtime. (May increase dose to 2 tablets (1,000 mg), 2 times a day if above dose is not effective).   alfuzosin (UROXATRAL) 10 MG 24 hr tablet Take 1 tablet (10 mg total) by mouth 2 (two) times daily.   amLODipine (NORVASC) 5 MG tablet TAKE 2 TABLETS BY MOUTH DAILY.----  takes in pm   atorvastatin (LIPITOR) 20 MG tablet Take 1 tablet by mouth every evening.   diclofenac Sodium (VOLTAREN) 1 % GEL Apply 2 g topically 4 (four) times daily.   donepezil (ARICEPT) 10 MG tablet Take 10 mg by mouth at bedtime.   esomeprazole (NEXIUM) 40 MG capsule TAKE 1 CAPSULE BY MOUTH DAILY BEFORE BREAKFAST.   latanoprost (XALATAN) 0.005 % ophthalmic solution 1 drop at bedtime.  solifenacin (VESICARE) 5 MG tablet Take 5 mg by mouth daily.   Vitamin D, Ergocalciferol, (DRISDOL) 1.25 MG (50000  UNIT) CAPS capsule TAKE 1 CAPSULE BY MOUTH EVERY 7 DAYS   No facility-administered encounter medications on file as of 06/17/2023.  :  Review of Systems:  Out of a complete 14 point review of systems, all are reviewed and negative with the exception of these symptoms as listed below:  Review of Systems  Neurological:        Patient is here alone for annual cpap compliance visit. He denies any issues with cpap and states he can easily get his supplies from his DME.     Objective:  Neurological Exam  Physical Exam Physical Examination:   Vitals:   06/17/23 0803  BP: 128/74  Pulse: 74    General Examination: The patient is a very pleasant 88 y.o. male in no acute distress. He appears well-developed and well-nourished and well groomed.   HEENT: Normocephalic, atraumatic, pupils are equal, round and reactive to light and accommodation. Extraocular tracking is good without limitation to gaze excursion or nystagmus noted. Normal smooth pursuit is noted. Hearing is grossly intact. Face is symmetric with normal facial animation and normal facial sensation. Speech is clear with no dysarthria noted. There is no hypophonia. There is no lip, neck/head, jaw or voice tremor. Neck is supple with full range of passive and active motion.  No carotid bruits.  Oropharynx exam reveals: mild mouth dryness, marginal dental hygiene with several teeth missing, and moderate airway crowding. Tongue protrudes centrally and palate elevates symmetrically.   Chest: Clear to auscultation without wheezing, rhonchi or crackles noted.   Heart: S1+S2+0, regular and normal without murmurs, rubs or gallops noted.    Abdomen: Soft, non-tender and non-distended.   Extremities: There is no pitting edema in the distal lower extremities bilaterally.    Skin: Warm and dry without trophic changes noted.   Musculoskeletal: exam reveals no obvious joint deformities, with the exception of arthritic changes in his hands.      Neurologically:  Mental status: The patient is awake, alert and oriented in all 4 spheres. His immediate and remote memory, attention, language skills and fund of knowledge are appropriate. There is no evidence of aphasia, agnosia, apraxia or anomia. Speech is clear with normal prosody and enunciation. Thought process is linear. Mood is normal and affect is normal.  Cranial nerves II - XII are as described above under HEENT exam. Motor exam: Normal bulk, strength and tone is noted. There is no obvious resting or action tremor.   Fine motor skills and coordination: intact for age.   Cerebellar testing: No dysmetria or intention tremor. There is no truncal or gait ataxia.  Sensory exam: intact to light touch in the upper and lower extremities.  Gait, station and balance: He stands slowly and pushes himself up.  Posture is mildly stooped but appears age-appropriate.  He walks slowly, no walking aid.    Assessment and Plan:  In summary, ALIF PETRAK is a very pleasant 88 year old male with an underlying medical history of hypertension, hyperlipidemia, glaucoma, prediabetes, reflux disease recurrent headaches and borderline obesity, who presents for follow-up consultation of his obstructive sleep apnea, well-established on CPAP therapy at a pressure of 9 cm with good apnea control and good tolerance of treatment via nasal mask.  He is compliant with treatment and up-to-date with his supplies.  He should be eligible for a new machine at this point but we mutually agreed  to maintain his treatment on the current machine as he has not had any problems and has not seen an error message on the display.  Should he have any problem with his CPAP or see an error message such as 'motor life exceeded' he is advised to get in touch with [place] via MyChart message with the assistance of his daughter or phone call.  We can start the process of getting him a new machine at the time, we would do a home sleep test for  reassessment of his sleep apnea and then issue a new PAP machine.  Of note, he had a baseline sleep study in March 2019 and a subsequent titration study in April 2019.  He had moderate obstructive sleep apnea and has done well with CPAP of 9 cm, originally was started on 10 cm. For now, he is advised to maintain treatment on the current machine at the current settings.  He is advised to seek follow-up with his dentist as he has not seen a dentist on a regular basis and may need dental care.  I advised him that dental caries can affect the entire body including bloodstream and heart.  He demonstrated understanding.  He is advised to follow-up routinely in this clinic to see one Shawnie Dapper, NP in 1 year.  I answered all his questions today and he was in agreement with our plan.  I spent 30 minutes in total face-to-face time and in reviewing records during pre-charting, more than 50% of which was spent in counseling and coordination of care, reviewing test results, reviewing medications and treatment regimen and/or in discussing or reviewing the diagnosis of OSA, the prognosis and treatment options. Pertinent laboratory and imaging test results that were available during this visit with the patient were reviewed by me and considered in my medical decision making (see chart for details).

## 2023-06-17 NOTE — Patient Instructions (Signed)
Please continue using your CPAP regularly. While your insurance requires that you use CPAP at least 4 hours each night on 70% of the nights, I recommend, that you not skip any nights and use it throughout the night if you can. Getting used to CPAP and staying with the treatment long term does take time and patience and discipline. Untreated obstructive sleep apnea when it is moderate to severe can have an adverse impact on cardiovascular health and raise her risk for heart disease, arrhythmias, hypertension, congestive heart failure, stroke and diabetes. Untreated obstructive sleep apnea causes sleep disruption, nonrestorative sleep, and sleep deprivation. This can have an impact on your day to day functioning and cause daytime sleepiness and impairment of cognitive function, memory loss, mood disturbance, and problems focussing. Using CPAP regularly can improve these symptoms. Since your machine is working well and you have not seen an error message on it that the motor life is exceeded, I think we can maintain you on your current machine.  If you see an error message on it, you can let us know, we will proceed with a home sleep test for reevaluation of your sleep apnea and start the process of getting you a new machine at the time.  For now, please plan to follow-up to see Shawnie Dapper, NP routinely in 1 year.

## 2023-06-26 ENCOUNTER — Encounter: Payer: Self-pay | Admitting: Family Medicine

## 2023-08-20 ENCOUNTER — Other Ambulatory Visit: Payer: Self-pay | Admitting: Family Medicine

## 2023-08-20 DIAGNOSIS — K7460 Unspecified cirrhosis of liver: Secondary | ICD-10-CM

## 2023-08-28 ENCOUNTER — Ambulatory Visit
Admission: RE | Admit: 2023-08-28 | Discharge: 2023-08-28 | Disposition: A | Discharge status: Home / Self Care | Payer: 59 | Source: Ambulatory Visit | Attending: Family Medicine | Admitting: Family Medicine

## 2023-08-28 DIAGNOSIS — K7460 Unspecified cirrhosis of liver: Secondary | ICD-10-CM

## 2023-09-05 ENCOUNTER — Other Ambulatory Visit: Payer: 59

## 2023-09-08 ENCOUNTER — Ambulatory Visit
Admission: RE | Admit: 2023-09-08 | Discharge: 2023-09-08 | Disposition: A | Discharge status: Home / Self Care | Payer: 59 | Source: Ambulatory Visit | Attending: Family Medicine | Admitting: Family Medicine

## 2023-12-26 ENCOUNTER — Inpatient Hospital Stay (HOSPITAL_COMMUNITY)
Admission: EM | Admit: 2023-12-26 | Discharge: 2023-12-31 | DRG: 853 | Disposition: A | Discharge status: Skilled Nursing Facility | Attending: Internal Medicine | Admitting: Internal Medicine

## 2023-12-26 ENCOUNTER — Emergency Department (HOSPITAL_COMMUNITY)

## 2023-12-26 ENCOUNTER — Encounter (HOSPITAL_COMMUNITY): Payer: Self-pay | Admitting: Pulmonary Disease

## 2023-12-26 ENCOUNTER — Other Ambulatory Visit: Payer: Self-pay

## 2023-12-26 DIAGNOSIS — N400 Enlarged prostate without lower urinary tract symptoms: Secondary | ICD-10-CM | POA: Diagnosis present

## 2023-12-26 DIAGNOSIS — A419 Sepsis, unspecified organism: Secondary | ICD-10-CM | POA: Diagnosis present

## 2023-12-26 DIAGNOSIS — E876 Hypokalemia: Secondary | ICD-10-CM | POA: Diagnosis not present

## 2023-12-26 DIAGNOSIS — F10130 Alcohol abuse with withdrawal, uncomplicated: Secondary | ICD-10-CM | POA: Diagnosis not present

## 2023-12-26 DIAGNOSIS — W19XXXA Unspecified fall, initial encounter: Secondary | ICD-10-CM | POA: Diagnosis present

## 2023-12-26 DIAGNOSIS — G309 Alzheimer's disease, unspecified: Secondary | ICD-10-CM | POA: Diagnosis present

## 2023-12-26 DIAGNOSIS — E7800 Pure hypercholesterolemia, unspecified: Secondary | ICD-10-CM | POA: Diagnosis present

## 2023-12-26 DIAGNOSIS — K660 Peritoneal adhesions (postprocedural) (postinfection): Secondary | ICD-10-CM | POA: Diagnosis present

## 2023-12-26 DIAGNOSIS — D689 Coagulation defect, unspecified: Secondary | ICD-10-CM | POA: Diagnosis present

## 2023-12-26 DIAGNOSIS — K859 Acute pancreatitis without necrosis or infection, unspecified: Secondary | ICD-10-CM | POA: Diagnosis present

## 2023-12-26 DIAGNOSIS — K219 Gastro-esophageal reflux disease without esophagitis: Secondary | ICD-10-CM | POA: Diagnosis present

## 2023-12-26 DIAGNOSIS — Z923 Personal history of irradiation: Secondary | ICD-10-CM

## 2023-12-26 DIAGNOSIS — G4733 Obstructive sleep apnea (adult) (pediatric): Secondary | ICD-10-CM | POA: Diagnosis not present

## 2023-12-26 DIAGNOSIS — I4891 Unspecified atrial fibrillation: Secondary | ICD-10-CM | POA: Diagnosis present

## 2023-12-26 DIAGNOSIS — J9601 Acute respiratory failure with hypoxia: Secondary | ICD-10-CM | POA: Diagnosis not present

## 2023-12-26 DIAGNOSIS — R6521 Severe sepsis with septic shock: Secondary | ICD-10-CM | POA: Diagnosis present

## 2023-12-26 DIAGNOSIS — E8721 Acute metabolic acidosis: Secondary | ICD-10-CM | POA: Diagnosis not present

## 2023-12-26 DIAGNOSIS — Z8546 Personal history of malignant neoplasm of prostate: Secondary | ICD-10-CM

## 2023-12-26 DIAGNOSIS — N179 Acute kidney failure, unspecified: Secondary | ICD-10-CM

## 2023-12-26 DIAGNOSIS — R609 Edema, unspecified: Secondary | ICD-10-CM | POA: Diagnosis not present

## 2023-12-26 DIAGNOSIS — N1832 Chronic kidney disease, stage 3b: Secondary | ICD-10-CM | POA: Diagnosis present

## 2023-12-26 DIAGNOSIS — D649 Anemia, unspecified: Secondary | ICD-10-CM | POA: Diagnosis present

## 2023-12-26 DIAGNOSIS — R7401 Elevation of levels of liver transaminase levels: Secondary | ICD-10-CM

## 2023-12-26 DIAGNOSIS — K7589 Other specified inflammatory liver diseases: Secondary | ICD-10-CM | POA: Diagnosis not present

## 2023-12-26 DIAGNOSIS — Z83438 Family history of other disorder of lipoprotein metabolism and other lipidemia: Secondary | ICD-10-CM

## 2023-12-26 DIAGNOSIS — R1011 Right upper quadrant pain: Secondary | ICD-10-CM | POA: Diagnosis not present

## 2023-12-26 DIAGNOSIS — K8000 Calculus of gallbladder with acute cholecystitis without obstruction: Secondary | ICD-10-CM | POA: Diagnosis present

## 2023-12-26 DIAGNOSIS — R531 Weakness: Principal | ICD-10-CM

## 2023-12-26 DIAGNOSIS — I1310 Hypertensive heart and chronic kidney disease without heart failure, with stage 1 through stage 4 chronic kidney disease, or unspecified chronic kidney disease: Secondary | ICD-10-CM | POA: Diagnosis present

## 2023-12-26 DIAGNOSIS — K82A1 Gangrene of gallbladder in cholecystitis: Secondary | ICD-10-CM | POA: Diagnosis present

## 2023-12-26 DIAGNOSIS — I10 Essential (primary) hypertension: Secondary | ICD-10-CM | POA: Diagnosis not present

## 2023-12-26 DIAGNOSIS — E872 Acidosis, unspecified: Secondary | ICD-10-CM

## 2023-12-26 DIAGNOSIS — Z79899 Other long term (current) drug therapy: Secondary | ICD-10-CM

## 2023-12-26 DIAGNOSIS — K7040 Alcoholic hepatic failure without coma: Secondary | ICD-10-CM | POA: Diagnosis present

## 2023-12-26 DIAGNOSIS — I480 Paroxysmal atrial fibrillation: Secondary | ICD-10-CM | POA: Diagnosis not present

## 2023-12-26 DIAGNOSIS — Y92009 Unspecified place in unspecified non-institutional (private) residence as the place of occurrence of the external cause: Secondary | ICD-10-CM | POA: Diagnosis not present

## 2023-12-26 DIAGNOSIS — K810 Acute cholecystitis: Secondary | ICD-10-CM | POA: Diagnosis not present

## 2023-12-26 DIAGNOSIS — R7989 Other specified abnormal findings of blood chemistry: Secondary | ICD-10-CM

## 2023-12-26 DIAGNOSIS — K828 Other specified diseases of gallbladder: Secondary | ICD-10-CM | POA: Diagnosis present

## 2023-12-26 DIAGNOSIS — E8722 Chronic metabolic acidosis: Secondary | ICD-10-CM | POA: Diagnosis present

## 2023-12-26 DIAGNOSIS — Z66 Do not resuscitate: Secondary | ICD-10-CM | POA: Diagnosis present

## 2023-12-26 DIAGNOSIS — K8309 Other cholangitis: Secondary | ICD-10-CM | POA: Diagnosis present

## 2023-12-26 DIAGNOSIS — F02A0 Dementia in other diseases classified elsewhere, mild, without behavioral disturbance, psychotic disturbance, mood disturbance, and anxiety: Secondary | ICD-10-CM | POA: Diagnosis present

## 2023-12-26 DIAGNOSIS — K7460 Unspecified cirrhosis of liver: Secondary | ICD-10-CM | POA: Diagnosis present

## 2023-12-26 DIAGNOSIS — R7303 Prediabetes: Secondary | ICD-10-CM | POA: Diagnosis present

## 2023-12-26 DIAGNOSIS — F10239 Alcohol dependence with withdrawal, unspecified: Secondary | ICD-10-CM | POA: Diagnosis present

## 2023-12-26 DIAGNOSIS — Z602 Problems related to living alone: Secondary | ICD-10-CM | POA: Diagnosis present

## 2023-12-26 DIAGNOSIS — F10939 Alcohol use, unspecified with withdrawal, unspecified: Secondary | ICD-10-CM

## 2023-12-26 DIAGNOSIS — R262 Difficulty in walking, not elsewhere classified: Secondary | ICD-10-CM | POA: Diagnosis present

## 2023-12-26 LAB — URINALYSIS, ROUTINE W REFLEX MICROSCOPIC
Bilirubin Urine: NEGATIVE
Glucose, UA: NEGATIVE mg/dL
Hgb urine dipstick: NEGATIVE
Ketones, ur: NEGATIVE mg/dL
Nitrite: NEGATIVE
Protein, ur: 100 mg/dL — AB
Specific Gravity, Urine: 1.021 (ref 1.005–1.030)
pH: 5 (ref 5.0–8.0)

## 2023-12-26 LAB — RAPID URINE DRUG SCREEN, HOSP PERFORMED
Amphetamines: NOT DETECTED
Barbiturates: NOT DETECTED
Benzodiazepines: NOT DETECTED
Cocaine: NOT DETECTED
Opiates: NOT DETECTED
Tetrahydrocannabinol: NOT DETECTED

## 2023-12-26 LAB — I-STAT CG4 LACTIC ACID, ED
Lactic Acid, Venous: 8.9 mmol/L (ref 0.5–1.9)
Lactic Acid, Venous: 7 mmol/L (ref 0.5–1.9)

## 2023-12-26 LAB — TSH: TSH: 0.662 u[IU]/mL (ref 0.350–4.500)

## 2023-12-26 LAB — I-STAT VENOUS BLOOD GAS, ED
Acid-base deficit: 15 mmol/L — ABNORMAL HIGH (ref 0.0–2.0)
Bicarbonate: 12.8 mmol/L — ABNORMAL LOW (ref 20.0–28.0)
Calcium, Ion: 1.06 mmol/L — ABNORMAL LOW (ref 1.15–1.40)
HCT: 43 % (ref 39.0–52.0)
Hemoglobin: 14.6 g/dL (ref 13.0–17.0)
O2 Saturation: 82 %
Potassium: 4 mmol/L (ref 3.5–5.1)
Sodium: 137 mmol/L (ref 135–145)
TCO2: 14 mmol/L — ABNORMAL LOW (ref 22–32)
pCO2, Ven: 34.7 mmHg — ABNORMAL LOW (ref 44–60)
pH, Ven: 7.174 — CL (ref 7.25–7.43)
pO2, Ven: 58 mmHg — ABNORMAL HIGH (ref 32–45)

## 2023-12-26 LAB — LACTIC ACID, PLASMA: Lactic Acid, Venous: 3 mmol/L (ref 0.5–1.9)

## 2023-12-26 LAB — CBC WITH DIFFERENTIAL/PLATELET
Abs Immature Granulocytes: 0.19 10*3/uL — ABNORMAL HIGH (ref 0.00–0.07)
Basophils Absolute: 0 10*3/uL (ref 0.0–0.1)
Basophils Relative: 0 %
Eosinophils Absolute: 0 10*3/uL (ref 0.0–0.5)
Eosinophils Relative: 0 %
HCT: 43.2 % (ref 39.0–52.0)
Hemoglobin: 13.5 g/dL (ref 13.0–17.0)
Immature Granulocytes: 2 %
Lymphocytes Relative: 9 %
Lymphs Abs: 0.8 10*3/uL (ref 0.7–4.0)
MCH: 30.6 pg (ref 26.0–34.0)
MCHC: 31.3 g/dL (ref 30.0–36.0)
MCV: 98 fL (ref 80.0–100.0)
Monocytes Absolute: 0.8 10*3/uL (ref 0.1–1.0)
Monocytes Relative: 9 %
Neutro Abs: 6.8 10*3/uL (ref 1.7–7.7)
Neutrophils Relative %: 80 %
Platelets: 189 10*3/uL (ref 150–400)
RBC: 4.41 MIL/uL (ref 4.22–5.81)
RDW: 18.5 % — ABNORMAL HIGH (ref 11.5–15.5)
WBC: 8.6 10*3/uL (ref 4.0–10.5)
nRBC: 0 % (ref 0.0–0.2)

## 2023-12-26 LAB — ACETAMINOPHEN LEVEL: Acetaminophen (Tylenol), Serum: <10 ug/mL — ABNORMAL LOW (ref 10–30)

## 2023-12-26 LAB — BLOOD GAS, VENOUS
Acid-base deficit: 5.1 mmol/L — ABNORMAL HIGH (ref 0.0–2.0)
Bicarbonate: 19.9 mmol/L — ABNORMAL LOW (ref 20.0–28.0)
Drawn by: 7344
O2 Saturation: 55.7 %
Patient temperature: 36.8
pCO2, Ven: 36 mmHg — ABNORMAL LOW (ref 44–60)
pH, Ven: 7.35 (ref 7.25–7.43)
pO2, Ven: 34 mmHg (ref 32–45)

## 2023-12-26 LAB — RESP PANEL BY RT-PCR (RSV, FLU A&B, COVID)  RVPGX2
Influenza A by PCR: NEGATIVE
Influenza B by PCR: NEGATIVE
Resp Syncytial Virus by PCR: NEGATIVE
SARS Coronavirus 2 by RT PCR: NEGATIVE

## 2023-12-26 LAB — MAGNESIUM: Magnesium: 1.1 mg/dL — ABNORMAL LOW (ref 1.7–2.4)

## 2023-12-26 LAB — COMPREHENSIVE METABOLIC PANEL WITH GFR
ALT: 1128 U/L — ABNORMAL HIGH (ref 0–44)
ALT: 1076 U/L — ABNORMAL HIGH (ref 0–44)
AST: 1997 U/L — ABNORMAL HIGH (ref 15–41)
AST: 1866 U/L — ABNORMAL HIGH (ref 15–41)
Albumin: 3.4 g/dL — ABNORMAL LOW (ref 3.5–5.0)
Albumin: 2.6 g/dL — ABNORMAL LOW (ref 3.5–5.0)
Alkaline Phosphatase: 84 U/L (ref 38–126)
Alkaline Phosphatase: 62 U/L (ref 38–126)
Anion gap: 27 — ABNORMAL HIGH (ref 5–15)
Anion gap: 20 — ABNORMAL HIGH (ref 5–15)
BUN: 33 mg/dL — ABNORMAL HIGH (ref 8–23)
BUN: 36 mg/dL — ABNORMAL HIGH (ref 8–23)
CO2: 9 mmol/L — ABNORMAL LOW (ref 22–32)
CO2: 14 mmol/L — ABNORMAL LOW (ref 22–32)
Calcium: 9.5 mg/dL (ref 8.9–10.3)
Calcium: 8.3 mg/dL — ABNORMAL LOW (ref 8.9–10.3)
Chloride: 100 mmol/L (ref 98–111)
Chloride: 105 mmol/L (ref 98–111)
Creatinine, Ser: 3.18 mg/dL — ABNORMAL HIGH (ref 0.61–1.24)
Creatinine, Ser: 2.8 mg/dL — ABNORMAL HIGH (ref 0.61–1.24)
GFR, Estimated: 18 mL/min — ABNORMAL LOW (ref >60)
GFR, Estimated: 21 mL/min — ABNORMAL LOW (ref >60)
Glucose, Bld: 135 mg/dL — ABNORMAL HIGH (ref 70–99)
Glucose, Bld: 163 mg/dL — ABNORMAL HIGH (ref 70–99)
Potassium: 4.3 mmol/L (ref 3.5–5.1)
Potassium: 3.9 mmol/L (ref 3.5–5.1)
Sodium: 140 mmol/L (ref 135–145)
Sodium: 139 mmol/L (ref 135–145)
Total Bilirubin: 3.4 mg/dL — ABNORMAL HIGH (ref 0.0–1.2)
Total Bilirubin: 2.5 mg/dL — ABNORMAL HIGH (ref 0.0–1.2)
Total Protein: 6.9 g/dL (ref 6.5–8.1)
Total Protein: 5.4 g/dL — ABNORMAL LOW (ref 6.5–8.1)

## 2023-12-26 LAB — LIPASE, BLOOD: Lipase: 76 U/L — ABNORMAL HIGH (ref 11–51)

## 2023-12-26 LAB — AMMONIA: Ammonia: 35 umol/L (ref 9–35)

## 2023-12-26 LAB — TYPE AND SCREEN
ABO/RH(D): O POS
Antibody Screen: NEGATIVE

## 2023-12-26 LAB — APTT: aPTT: 30 s (ref 24–36)

## 2023-12-26 LAB — GLUCOSE, CAPILLARY: Glucose-Capillary: 153 mg/dL — ABNORMAL HIGH (ref 70–99)

## 2023-12-26 LAB — POC OCCULT BLOOD, ED: Fecal Occult Bld: NEGATIVE

## 2023-12-26 LAB — CBG MONITORING, ED: Glucose-Capillary: 116 mg/dL — ABNORMAL HIGH (ref 70–99)

## 2023-12-26 LAB — HEPATITIS PANEL, ACUTE
HCV Ab: NONREACTIVE
Hep A IgM: NONREACTIVE
Hep B C IgM: NONREACTIVE
Hepatitis B Surface Ag: NONREACTIVE

## 2023-12-26 LAB — ETHANOL: Alcohol, Ethyl (B): <10 mg/dL (ref <10)

## 2023-12-26 LAB — MRSA NEXT GEN BY PCR, NASAL: MRSA by PCR Next Gen: NOT DETECTED

## 2023-12-26 LAB — CK: Total CK: 32 U/L — ABNORMAL LOW (ref 49–397)

## 2023-12-26 LAB — PROTIME-INR
INR: 1.7 — ABNORMAL HIGH (ref 0.8–1.2)
Prothrombin Time: 20.4 s — ABNORMAL HIGH (ref 11.4–15.2)

## 2023-12-26 MED ORDER — ONDANSETRON HCL 4 MG/2ML IJ SOLN
4.0000 mg | Freq: Once | INTRAMUSCULAR | Status: AC
  Administered 2023-12-26: 4 mg via INTRAVENOUS
  Filled 2023-12-26: qty 2

## 2023-12-26 MED ORDER — VANCOMYCIN VARIABLE DOSE PER UNSTABLE RENAL FUNCTION (PHARMACIST DOSING): Status: DC

## 2023-12-26 MED ORDER — LACTATED RINGERS IV BOLUS (SEPSIS)
1000.0000 mL | Freq: Once | INTRAVENOUS | Status: AC
  Administered 2023-12-26: 1000 mL via INTRAVENOUS

## 2023-12-26 MED ORDER — SODIUM CHLORIDE 0.9 % IV SOLN
2.0000 g | Freq: Once | INTRAVENOUS | Status: AC
  Administered 2023-12-26: 2 g via INTRAVENOUS
  Filled 2023-12-26: qty 12.5

## 2023-12-26 MED ORDER — DONEPEZIL HCL 10 MG PO TABS
10.0000 mg | ORAL_TABLET | Freq: Every day | ORAL | Status: DC
  Administered 2023-12-27 – 2023-12-30 (×4): 10 mg via ORAL
  Filled 2023-12-26 (×6): qty 1

## 2023-12-26 MED ORDER — THIAMINE MONONITRATE 100 MG PO TABS
100.0000 mg | ORAL_TABLET | Freq: Every day | ORAL | Status: DC
  Administered 2023-12-27 – 2023-12-31 (×4): 100 mg via ORAL
  Filled 2023-12-26 (×4): qty 1

## 2023-12-26 MED ORDER — VANCOMYCIN HCL IN DEXTROSE 1-5 GM/200ML-% IV SOLN
1000.0000 mg | Freq: Once | INTRAVENOUS | Status: AC
  Administered 2023-12-26: 1000 mg via INTRAVENOUS
  Filled 2023-12-26: qty 200

## 2023-12-26 MED ORDER — AMIODARONE HCL IN DEXTROSE 360-4.14 MG/200ML-% IV SOLN
30.0000 mg/h | INTRAVENOUS | Status: DC
  Administered 2023-12-27 – 2023-12-28 (×3): 30 mg/h via INTRAVENOUS
  Filled 2023-12-26 (×3): qty 200

## 2023-12-26 MED ORDER — AMIODARONE HCL IN DEXTROSE 360-4.14 MG/200ML-% IV SOLN
INTRAVENOUS | Status: AC
  Filled 2023-12-26: qty 200

## 2023-12-26 MED ORDER — AMIODARONE LOAD VIA INFUSION
150.0000 mg | Freq: Once | INTRAVENOUS | Status: AC
  Administered 2023-12-26: 150 mg via INTRAVENOUS

## 2023-12-26 MED ORDER — LORAZEPAM 2 MG/ML IJ SOLN
1.0000 mg | Freq: Once | INTRAMUSCULAR | Status: AC
  Administered 2023-12-26: 1 mg via INTRAVENOUS
  Filled 2023-12-26: qty 1

## 2023-12-26 MED ORDER — DOCUSATE SODIUM 100 MG PO CAPS: 100.0000 mg | ORAL_CAPSULE | Freq: Two times a day (BID) | ORAL | Status: DC | PRN

## 2023-12-26 MED ORDER — PANTOPRAZOLE SODIUM 40 MG IV SOLR
40.0000 mg | INTRAVENOUS | Status: DC
  Administered 2023-12-26: 40 mg via INTRAVENOUS
  Filled 2023-12-26: qty 10

## 2023-12-26 MED ORDER — CHLORHEXIDINE GLUCONATE CLOTH 2 % EX PADS
6.0000 | MEDICATED_PAD | Freq: Every day | CUTANEOUS | Status: DC
Start: 2023-12-27 — End: 2023-12-31
  Administered 2023-12-27 – 2023-12-31 (×4): 6 via TOPICAL

## 2023-12-26 MED ORDER — ADULT MULTIVITAMIN W/MINERALS CH
1.0000 | ORAL_TABLET | Freq: Every day | ORAL | Status: DC
  Administered 2023-12-27 – 2023-12-31 (×4): 1 via ORAL
  Filled 2023-12-26 (×4): qty 1

## 2023-12-26 MED ORDER — POLYETHYLENE GLYCOL 3350 17 G PO PACK: 17.0000 g | PACK | Freq: Every day | ORAL | Status: DC | PRN

## 2023-12-26 MED ORDER — DILTIAZEM HCL-DEXTROSE 125-5 MG/125ML-% IV SOLN (PREMIX)
5.0000 mg/h | INTRAVENOUS | Status: DC
  Administered 2023-12-26: 5 mg/h via INTRAVENOUS
  Filled 2023-12-26: qty 125

## 2023-12-26 MED ORDER — DILTIAZEM LOAD VIA INFUSION
15.0000 mg | Freq: Once | INTRAVENOUS | Status: AC
  Administered 2023-12-26: 15 mg via INTRAVENOUS
  Filled 2023-12-26: qty 15

## 2023-12-26 MED ORDER — METRONIDAZOLE 500 MG/100ML IV SOLN: 500.0000 mg | Freq: Two times a day (BID) | INTRAVENOUS | Status: DC

## 2023-12-26 MED ORDER — ATORVASTATIN CALCIUM 10 MG PO TABS
20.0000 mg | ORAL_TABLET | Freq: Every day | ORAL | Status: DC
  Administered 2023-12-27: 20 mg via ORAL
  Filled 2023-12-26 (×2): qty 2

## 2023-12-26 MED ORDER — AMIODARONE HCL IN DEXTROSE 360-4.14 MG/200ML-% IV SOLN
60.0000 mg/h | INTRAVENOUS | Status: DC
  Administered 2023-12-26 (×2): 60 mg/h via INTRAVENOUS
  Filled 2023-12-26: qty 200

## 2023-12-26 MED ORDER — SODIUM CHLORIDE 0.9 % IV SOLN
2.0000 g | INTRAVENOUS | Status: DC
  Administered 2023-12-27: 2 g via INTRAVENOUS
  Filled 2023-12-26: qty 12.5

## 2023-12-26 MED ORDER — METRONIDAZOLE 500 MG/100ML IV SOLN
500.0000 mg | Freq: Once | INTRAVENOUS | Status: AC
  Administered 2023-12-26: 500 mg via INTRAVENOUS
  Filled 2023-12-26: qty 100

## 2023-12-26 MED ORDER — PANTOPRAZOLE SODIUM 40 MG IV SOLR
40.0000 mg | Freq: Two times a day (BID) | INTRAVENOUS | Status: DC
  Administered 2023-12-26 – 2023-12-30 (×8): 40 mg via INTRAVENOUS
  Filled 2023-12-26 (×8): qty 10

## 2023-12-26 MED ORDER — METRONIDAZOLE 500 MG/100ML IV SOLN
500.0000 mg | Freq: Two times a day (BID) | INTRAVENOUS | Status: DC
  Administered 2023-12-27 (×2): 500 mg via INTRAVENOUS
  Filled 2023-12-26 (×2): qty 100

## 2023-12-26 MED ORDER — LACTATED RINGERS IV BOLUS
500.0000 mL | Freq: Once | INTRAVENOUS | Status: AC
  Administered 2023-12-26: 500 mL via INTRAVENOUS

## 2023-12-26 NOTE — Consult Note (Signed)
 Jorge Martin August 03, 1935  [id number].    Requesting MD: Eloise Harman, MD Chief Complaint/Reason for Consult: cholangitis   HPI: Jorge Martin is a 89 y.o. M who presents to the ED via EMS due to generalized weakness and body shakes.  Patient is somnolent but arousable.  Seen with CCM.  History obtained by chart review and family at bedside as information obtained from patient was limited.  Family reports he was drinking more (unknown amount) of alcohol daily until 9 days ago. After this time he developed body shakes and generalized weakness. In the last 1-2 days he has developed nausea, vomiting, and diarrhea. BMs in the last 24 hours described as black and loose. He denies fever. No reports of abdominal pain. No reported uop since arrival - RN planning to place foley.   At baseline he lives alone in a house in [place], [place]. He has two daughters in town, [place] and [place].  In the ED patient afebrile.  Initially without tachycardia but now in A-fib with RVR.  Initially hypotensive and now normotensive after resuscitation.  Noted to have hypoxia now on O2.  WBC 8.6.  Lactic 8.9.  AKI with creatinine of 3.18.  Alk phos 84, AST 1997, ALT 1128, T. bili 3.4. Lipase pending.  RUQ [place] with cholelithiasis without evidence of choledocholithiasis.  CBD was dilated at 10 mm.  CT A/P pending.  Past Medical History: HTN, alcohol abuse, GERD, prostate CA (Dx 2019), gout, HLD, OSA on CPAP, prediabetes, headaches, and CKD3, ?dementia Prior Abdominal Surgeries: none, has had prostate seed placement and radiation therapy. Blood Thinners: none Alcohol Use: daily, reportedly quit 9 days ago  ROS: as above  Family History  Problem Relation Age of Onset   Hyperlipidemia Mother    Hyperlipidemia Father    Cancer Maternal Aunt        stomach    Past Medical History:  Diagnosis Date   Arthritis    ED (erectile dysfunction)    Elevated serum creatinine 11/2020   GERD (gastroesophageal reflux  disease)    Headache    History of alcoholism (HCC)    02-17-2018  per pt quit 1999   History of gout 09/2011   right ankle   Hypercholesteremia    Hyperplasia of prostate with lower urinary tract symptoms (LUTS)    Hypertension    OSA on CPAP followed by dr Frances Furbish   study 11-25-2017 moderate to severe osa (AHI 28.4/hr,  REM AHI 38/hr)   Pre-diabetes    Prostate cancer [facility]) urologist-  dr Ronne Binning  onologist-  dr Kathrynn Running   dx 11-18-2017  Stage T2a,  Gleason 4+3,  PSA 16.90,  vol 124cc-- treatment plan external beam radiation   Seasonal allergic rhinitis    Weak urinary stream     Past Surgical History:  Procedure Laterality Date   GOLD SEED IMPLANT N/A 02/23/2018   Procedure: GOLD SEED IMPLANT;  Surgeon: Malen Gauze, MD;  Location: [facility];  Service: Urology;  Laterality: N/A;   NO PAST SURGERIES     PROSTATE BIOPSY  11-18-2017  dr Ronne Binning  office   SPACE OAR INSTILLATION N/A 02/23/2018   Procedure: SPACE OAR INSTILLATION;  Surgeon: Malen Gauze, MD;  Location: [facility];  Service: Urology;  Laterality: N/A;    Social History:  reports that he has never smoked. He has never used smokeless tobacco. He reports that he does not currently use alcohol. He reports that he does not  use drugs.  Allergies: No Known Allergies  (Not in a hospital admission)    Physical Exam: Blood pressure 110/72, pulse (!) 133, temperature 97.6 F (36.4 C), temperature source Oral, resp. rate 20, SpO2 (!) 81%. General: pleasant, elderly mal HEENT: head is normocephalic, atraumatic.  Sclera are non-icteric.  Heart: Irr, irr  Lungs: Respiratory effort nonlabored Abd:  Soft, mild distension, ruq ttp on deep palpation - otherwise NT. +BS. No masses, hernias, or organomegaly MS: no BUE edema Skin: warm and dry    Results for orders placed or performed during the hospital encounter of 12/26/23 (from the past 48 hours)  Comprehensive metabolic panel      Status: Abnormal   Collection Time: 12/26/23  9:00 AM  Result Value Ref Range   Sodium 140 135 - 145 mmol/L   Potassium 4.3 3.5 - 5.1 mmol/L   Chloride 100 98 - 111 mmol/L   CO2 9 (L) 22 - 32 mmol/L   Glucose, Bld 135 (H) 70 - 99 mg/dL    Comment: Glucose reference range applies only to samples taken after fasting for at least 8 hours.   BUN 33 (H) 8 - 23 mg/dL   Creatinine, Ser 8.11 (H) 0.61 - 1.24 mg/dL   Calcium 9.5 8.9 - 91.4 mg/dL   Total Protein 6.9 6.5 - 8.1 g/dL   Albumin 3.4 (L) 3.5 - 5.0 g/dL   AST 7,829 (H) 15 - 41 U/L   ALT 1,128 (H) 0 - 44 U/L   Alkaline Phosphatase 84 38 - 126 U/L   Total Bilirubin 3.4 (H) 0.0 - 1.2 mg/dL   GFR, Estimated 18 (L) >60 mL/min    Comment: (NOTE) Calculated using the CKD-EPI Creatinine Equation (2021)    Anion gap 27 (H) 5 - 15    Comment: ELECTROLYTES REPEATED TO VERIFY Performed at [facility], [address], [place], [place] [address]   CBC with Differential/Platelet     Status: Abnormal   Collection Time: 12/26/23  9:00 AM  Result Value Ref Range   WBC 8.6 4.0 - 10.5 K/uL   RBC 4.41 4.22 - 5.81 MIL/uL   Hemoglobin 13.5 13.0 - 17.0 g/dL   HCT 08.6 57.8 - 46.9 %   MCV 98.0 80.0 - 100.0 fL   MCH 30.6 26.0 - 34.0 pg   MCHC 31.3 30.0 - 36.0 g/dL   RDW 62.9 (H) 52.8 - 41.3 %   Platelets 189 150 - 400 K/uL   nRBC 0.0 0.0 - 0.2 %   Neutrophils Relative % 80 %   Neutro Abs 6.8 1.7 - 7.7 K/uL   Lymphocytes Relative 9 %   Lymphs Abs 0.8 0.7 - 4.0 K/uL   Monocytes Relative 9 %   Monocytes Absolute 0.8 0.1 - 1.0 K/uL   Eosinophils Relative 0 %   Eosinophils Absolute 0.0 0.0 - 0.5 K/uL   Basophils Relative 0 %   Basophils Absolute 0.0 0.0 - 0.1 K/uL   Immature Granulocytes 2 %   Abs Immature Granulocytes 0.19 (H) 0.00 - 0.07 K/uL    Comment: Performed at [facility], [address], [place], [place] [address]  I-Stat venous blood gas, ED     Status: Abnormal   Collection Time: 12/26/23  9:23 AM  Result Value Ref  Range   pH, Ven 7.174 (LL) 7.25 - 7.43   pCO2, Ven 34.7 (L) 44 - 60 mmHg   pO2, Ven 58 (H) 32 - 45 mmHg   Bicarbonate 12.8 (  L) 20.0 - 28.0 mmol/L   TCO2 14 (L) 22 - 32 mmol/L   O2 Saturation 82 %   Acid-base deficit 15.0 (H) 0.0 - 2.0 mmol/L   Sodium 137 135 - 145 mmol/L   Potassium 4.0 3.5 - 5.1 mmol/L   Calcium, Ion 1.06 (L) 1.15 - 1.40 mmol/L   HCT 43.0 39.0 - 52.0 %   Hemoglobin 14.6 13.0 - 17.0 g/dL   Sample type VENOUS    Comment NOTIFIED PHYSICIAN   Ammonia     Status: None   Collection Time: 12/26/23  9:33 AM  Result Value Ref Range   Ammonia 35 9 - 35 umol/L    Comment: Performed at [facility], [address], [place], [place] [address]  Ethanol     Status: None   Collection Time: 12/26/23  9:33 AM  Result Value Ref Range   Alcohol, Ethyl (B) <10 <10 mg/dL    Comment: (NOTE) Lowest detectable limit for serum alcohol is 10 mg/dL.  For medical purposes only. Performed at [facility], [address], [place], [place] [address]   Resp panel by RT-PCR (RSV, Flu A&B, Covid) Urine, Clean Catch     Status: None   Collection Time: 12/26/23 10:00 AM   Specimen: Urine, Clean Catch; Nasal Swab  Result Value Ref Range   SARS Coronavirus 2 by RT PCR NEGATIVE NEGATIVE   Influenza A by PCR NEGATIVE NEGATIVE   Influenza B by PCR NEGATIVE NEGATIVE    Comment: (NOTE) The Xpert Xpress SARS-CoV-2/FLU/RSV plus assay is intended as an aid in the diagnosis of influenza from Nasopharyngeal swab specimens and should not be used as a sole basis for treatment. Nasal washings and aspirates are unacceptable for Xpert Xpress SARS-CoV-2/FLU/RSV testing.  Fact Sheet for Patients: BloggerCourse.com  Fact Sheet for Healthcare Providers: SeriousBroker.it  This test is not yet approved or cleared by the [place] FDA and has been authorized for detection and/or diagnosis of SARS-CoV-2 by FDA under an Emergency Use  Authorization (EUA). This EUA will remain in effect (meaning this test can be used) for the duration of the COVID-19 declaration under Section 564(b)(1) of the Act, 21 U.S.C. section 360bbb-3(b)(1), unless the authorization is terminated or revoked.     Resp Syncytial Virus by PCR NEGATIVE NEGATIVE    Comment: (NOTE) Fact Sheet for Patients: BloggerCourse.com  Fact Sheet for Healthcare Providers: SeriousBroker.it  This test is not yet approved or cleared by the [place] FDA and has been authorized for detection and/or diagnosis of SARS-CoV-2 by FDA under an Emergency Use Authorization (EUA). This EUA will remain in effect (meaning this test can be used) for the duration of the COVID-19 declaration under Section 564(b)(1) of the Act, 21 U.S.C. section 360bbb-3(b)(1), unless the authorization is terminated or revoked.  Performed at [facility], [address], [place], [place] [address]   POC occult blood, ED     Status: None   Collection Time: 12/26/23 10:15 AM  Result Value Ref Range   Fecal Occult Bld NEGATIVE NEGATIVE  Type and screen [facility]     Status: None   Collection Time: 12/26/23 10:49 AM  Result Value Ref Range   ABO/RH(D) O POS    Antibody Screen NEG    Sample Expiration      12/29/2023,2359 Performed at [facility], [address], [place], [place] [address]   I-Stat Lactic Acid, ED     Status: Abnormal  Collection Time: 12/26/23 11:07 AM  Result Value Ref Range   Lactic Acid, Venous 8.9 (HH) 0.5 - 1.9 mmol/L   Comment NOTIFIED PHYSICIAN   CBG monitoring, ED     Status: Abnormal   Collection Time: 12/26/23 11:23 AM  Result Value Ref Range   Glucose-Capillary 116 (H) 70 - 99 mg/dL    Comment: Glucose reference range applies only to samples taken after fasting for at least 8 hours.   US Abdomen Limited RUQ (LIVER/GB) Result Date: 12/26/2023 CLINICAL DATA:  Elevated  liver function tests EXAM: ULTRASOUND ABDOMEN LIMITED RIGHT UPPER QUADRANT COMPARISON:  CT abdomen pelvis 06/19/2022 FINDINGS: Gallbladder: Gallstones: Present Sludge: Present Gallbladder Wall: Within normal limits Pericholecystic fluid: None Sonographic Murphy's Sign: Negative per technologist Common bile duct: Diameter: 10 mm Liver: Parenchymal echogenicity: Diffusely increased Contours: Normal Lesions: None Portal vein: Patent.  Hepatopetal flow Other: None. IMPRESSION: 1. Cholelithiasis without additional sonographic evidence of acute cholecystitis. 2. Dilated common bile duct measuring up to 10 mm. If the patient's liver function tests are suggestive of biliary obstruction, further evaluation with MRI/MRCP should be performed. 3. Diffuse increased echogenicity of the hepatic parenchyma is a nonspecific indicator of hepatocellular dysfunction, most commonly steatosis. Electronically Signed   By: Acquanetta Belling M.D.   On: 12/26/2023 12:19   DG Chest Port 1 View Result Date: 12/26/2023 CLINICAL DATA:  Possible sepsis. EXAM: PORTABLE CHEST 1 VIEW COMPARISON:  07/25/2014 FINDINGS: Normal sized heart. Mildly tortuous aorta. Stable large right diaphragmatic eventration with minimal right basilar atelectasis/scarring. Clear left lung. Unremarkable bones. IMPRESSION: 1. No acute abnormality. 2. Stable large right diaphragmatic eventration with minimal right basilar atelectasis/scarring. Electronically Signed   By: Beckie Salts M.D.   On: 12/26/2023 10:39   CT HEAD WO CONTRAST Result Date: 12/26/2023 CLINICAL DATA:  Tremors EXAM: CT HEAD WITHOUT CONTRAST TECHNIQUE: Contiguous axial images were obtained from the base of the skull through the vertex without intravenous contrast. RADIATION DOSE REDUCTION: This exam was performed according to the departmental dose-optimization program which includes automated exposure control, adjustment of the mA and/or kV according to patient size and/or use of iterative reconstruction  technique. COMPARISON:  Brain MRI 09/12/2017 FINDINGS: Brain: No evidence of acute infarction, hemorrhage, hydrocephalus, extra-axial collection or mass lesion/mass effect. Generalized brain atrophy similar to prior. Mild chronic small vessel ischemia in the deep white matter. Vascular: No hyperdense vessel or unexpected calcification. Skull: Normal. Negative for fracture or focal lesion. Sinuses/Orbits: No acute finding. IMPRESSION: Aging brain without acute or reversible finding. No convincing change since a 2018 MRI. Electronically Signed   By: Tiburcio Pea M.D.   On: 12/26/2023 10:01    Anti-infectives (From admission, onward)    Start     Dose/Rate Route Frequency Ordered Stop   12/26/23 1015  ceFEPIme (MAXIPIME) 2 g in sodium chloride 0.9 % 100 mL IVPB        2 g 200 mL/hr over 30 Minutes Intravenous  Once 12/26/23 1001 12/26/23 1206   12/26/23 1015  metroNIDAZOLE (FLAGYL) IVPB 500 mg        500 mg 100 mL/hr over 60 Minutes Intravenous  Once 12/26/23 1001     12/26/23 1015  vancomycin (VANCOCIN) IVPB 1000 mg/200 mL premix        1,000 mg 200 mL/hr over 60 Minutes Intravenous  Once 12/26/23 1001           Assessment/Plan Cholelithiasis  Elevated LFT's -  RUQ U/S shows cholelithiasis without features of cholecystitis, with a dilated common  bile duct (10 mm) - CT abdomen/pelvis W/O contrast shows a distended gallbladder with cholelithiasis and surrounding inflammatory changes, including edema of the pancreatitic head.  - Afebrile. WBC wnl. Lactic acid elevated but downtrending (8.9 > 7). Acidotic on VBG - LFT's elevated: AST 1,997, ALT 1,128, alk phos 84, t.bili 3.4 ( LFT's normal 3 years ago) - Add on serum lipase - Hepatitis panel pending - GI consult pending - Agree with abx. Cont - No indication for emergency surgery at this time. His US showed no evidence of Cholecystitis but there is some inflammatory changes on CT that is unclear if 2/2 pancreatitis (lipase pending).  Would continue abx, resuscitation and await GI recs given LFT's elevation of unclear etiology (in the setting of gallstones + dilated cbd on imaging, etoh use, and sepsis) to see if pt needs MRCP/ERCP to r/o choledocholithiasis/cholangitis. Continue to trend LFT's.   If pts clinical picture is not improving and felt to be coming from the GB would recommend perc chole drain.   FEN - NPO VTE - SCDs, okay for chem ppx from a general surgery standpoint. If felt to need therapeutic anticoagulation would favor heparin gtt or therapeutic lovenox  ID - Cefepime/Flagyl. Resp panel negative, Blood cultures and UA 4/11 pending  Foley - no uop - RN place Dispo - Admit to TRH/CCM.   Shock with signs of acute kidney and liver dysfunction Lactic acidosis  A.fib with RVR AKI on CKD  Reported black stools - fecal occult blood negative  EtOH abuse  HTN HLD OSA on CPAP prediabetes PMH Prostate CA s/p radiation therapy, Dr. Lucas Mallow. Kathrynn Running Headaches GERD  I reviewed nursing notes, ED provider notes, last 24 h vitals and pain scores, last 48 h intake and output, last 24 h labs and trends, and last 24 h imaging results.  Jacinto Halim, [facility] Surgery 12/26/2023, 1:05 PM Please see Amion for pager number during day hours 7:00am-4:30pm

## 2023-12-26 NOTE — H&P (Signed)
 NAME:  Jorge Martin, MRN:  [id number], DOB:  10/14/1934, LOS: 0 ADMISSION DATE:  12/26/2023, CONSULTATION DATE:  12/26/23 REFERRING MD:  Eloise Harman, CHIEF COMPLAINT:  weakness    History of Present Illness:  Jorge Martin is a 89 y.o. M with PMH of HTN and early Alzheimer's, HTN, OSA, Prostate Ca, CKD 3b  who has a history of ETOH abuse.  His daughter's are at the bedside and report that he had quit drinking for some time and then picked it up again a couple of months ago.  He quit again cold [place] sometime in the last 7-9 days.  He has not felt like eating much in that time, he had some tremors but no confusion (at baseline he just has mild memory loss and takes care of himself).  He denies fevers, chills or abdominal pain.  He was so weak that his family brought him to the ED this morning.  Since arrival he has vomited several times in the ED and converted to atrial fibrillation with RVR.  Work-up was significant for elevated lactic acid of 8.9, pH 7.17, creatinine 3.1 (baseline 1.2-1.4), bili 3.4, AST 1997, ALT 1128, WBC 8.6.  CT abd/pelvis significant for cholelithiasis without evidence of cholecystitis  Pertinent  Medical History   has a past medical history of Arthritis, ED (erectile dysfunction), Elevated serum creatinine (11/2020), GERD (gastroesophageal reflux disease), Headache, History of alcoholism (HCC), History of gout (09/2011), Hypercholesteremia, Hyperplasia of prostate with lower urinary tract symptoms (LUTS), Hypertension, OSA on CPAP (followed by dr Frances Furbish), Pre-diabetes, Prostate cancer [facility]) (urologist-  dr Ronne Binning  onologist-  dr Kathrynn Running), Seasonal allergic rhinitis, and Weak urinary stream.   Significant Hospital Events: Including procedures, antibiotic start and stop dates in addition to other pertinent events   4/11 admit to ICU with AKI, cholecystitis and Afib  Interim History / Subjective:  As above   Objective   Blood pressure 122/74, pulse (!) 140, temperature  97.6 F (36.4 C), temperature source Axillary, resp. rate (!) 23, SpO2 99%.       No intake or output data in the 24 hours ending 12/26/23 1336 There were no vitals filed for this visit.  General:  well nourished elderly M, resting in bed in NAD HEENT: MM pink/moist, sclera anicteric Neuro: sleeping, but arousable to voice, moving all extremities, oriented to person, confused to situation CV: s1s2 tachycardic, irregular, no m/r/g PULM:  clear bilaterally on RA GI: soft, no significant TTP  Extremities: warm/dry, no edema  Skin: no rashes or lesions   Resolved Hospital Problem list     Assessment & Plan:    New onset Atrial Fibrillation with RVR Lactic acidosis HTN Suspect afib is secondary to acute illness and volume depletion, no hypotension  -admit to ICU for close monitoring -continue diltiazem, watch hemodynamics -received 2L LR in the ED, give additional 500cc appears dry -check TSH, echo  -lactic down-trending, continue to follow -hold home norvasc and cozaar -continue statin -continue broad spectrum antibiotics for now, blood cultures are pending, attempt to obtain UC -NPO while vomiting   AKI superimposed on CKD  Suspect pre-renal volume depletion -additional 500cc LR -place foley -repeat CMP, monitor renal indices and electrolytes    ETOH abuse and withdrawal Now at least 7 days after last drink, not currently in DT's -do not think CIWA is currently indicated, he is sleepy and calm after 0.5mg  Ativan -thiamine 100mg  every day, folic acid and MV  Elevated Transaminases Hyperbilirubinemia Cholecystitis Surgery and GI consulted Differential  includes acute alcoholic hepatitis, pancreatitis, cholangitis -no immediate plans for the OR -acute hepatitis panel and tylenol panel pending  -lipase  -check coags   Baseline OSA -CPAP at bedtime when more awake   Dementia  -continue Aricept     Best Practice (right click and "Reselect all SmartList  Selections" daily)   Diet/type: NPO w/ oral meds DVT prophylaxis SCD Pressure ulcer(s): N/A GI prophylaxis: N/A Lines: N/A Foley:  Yes, and it is still needed Code Status:  DNR Last date of multidisciplinary goals of care discussion [Daughters updated at the bedside, 4/11]  Labs   CBC: Recent Labs  Lab 12/26/23 0900 12/26/23 0923  WBC 8.6  --   NEUTROABS 6.8  --   HGB 13.5 14.6  HCT 43.2 43.0  MCV 98.0  --   PLT 189  --     Basic Metabolic Panel: Recent Labs  Lab 12/26/23 0900 12/26/23 0923  NA 140 137  K 4.3 4.0  CL 100  --   CO2 9*  --   GLUCOSE 135*  --   BUN 33*  --   CREATININE 3.18*  --   CALCIUM 9.5  --    GFR: CrCl cannot be calculated (Unknown ideal weight.). Recent Labs  Lab 12/26/23 0900 12/26/23 1107  WBC 8.6  --   LATICACIDVEN  --  8.9*    Liver Function Tests: Recent Labs  Lab 12/26/23 0900  AST 1,997*  ALT 1,128*  ALKPHOS 84  BILITOT 3.4*  PROT 6.9  ALBUMIN 3.4*   No results for input(s): "LIPASE", "AMYLASE" in the last 168 hours. Recent Labs  Lab 12/26/23 0933  AMMONIA 35    ABG    Component Value Date/Time   HCO3 12.8 (L) 12/26/2023 0923   TCO2 14 (L) 12/26/2023 0923   ACIDBASEDEF 15.0 (H) 12/26/2023 0923   O2SAT 82 12/26/2023 0923     Coagulation Profile: No results for input(s): "INR", "PROTIME" in the last 168 hours.  Cardiac Enzymes: No results for input(s): "CKTOTAL", "CKMB", "CKMBINDEX", "TROPONINI" in the last 168 hours.  HbA1C: Hemoglobin A1C  Date/Time Value Ref Range Status  11/05/2019 02:55 PM 6.1 (A) 4.0 - 5.6 % Final  04/29/2018 10:17 AM 5.9 (A) 4.0 - 5.6 % Final    CBG: Recent Labs  Lab 12/26/23 1123  GLUCAP 116*    Review of Systems:   Please see the history of present illness. All other systems reviewed and are negative    Past Medical History:  He,  has a past medical history of Arthritis, ED (erectile dysfunction), Elevated serum creatinine (11/2020), GERD (gastroesophageal reflux  disease), Headache, History of alcoholism (HCC), History of gout (09/2011), Hypercholesteremia, Hyperplasia of prostate with lower urinary tract symptoms (LUTS), Hypertension, OSA on CPAP (followed by dr Frances Furbish), Pre-diabetes, Prostate cancer [facility]) (urologist-  dr Ronne Binning  onologist-  dr Kathrynn Running), Seasonal allergic rhinitis, and Weak urinary stream.   Surgical History:   Past Surgical History:  Procedure Laterality Date   GOLD SEED IMPLANT N/A 02/23/2018   Procedure: GOLD SEED IMPLANT;  Surgeon: Malen Gauze, MD;  Location: [facility];  Service: Urology;  Laterality: N/A;   NO PAST SURGERIES     PROSTATE BIOPSY  11-18-2017  dr Ronne Binning  office   SPACE OAR INSTILLATION N/A 02/23/2018   Procedure: SPACE OAR INSTILLATION;  Surgeon: Malen Gauze, MD;  Location: [facility];  Service: Urology;  Laterality: N/A;     Social History:   reports that he  has never smoked. He has never used smokeless tobacco. He reports that he does not currently use alcohol. He reports that he does not use drugs.   Family History:  His family history includes Cancer in his maternal aunt; Hyperlipidemia in his father and mother.   Allergies No Known Allergies   Home Medications  Prior to Admission medications   Medication Sig Start Date End Date Taking? Authorizing Provider  acetaminophen (TYLENOL) 500 MG tablet Take 1 tablet (500 mg total) by mouth in the morning and at bedtime. (May increase dose to 2 tablets (1,000 mg), 2 times a day if above dose is not effective). Patient taking differently: Take 500 mg by mouth 2 (two) times daily as needed for moderate pain (pain score 4-6), fever or headache. 02/07/20  Yes Kallie Locks, FNP  amLODipine (NORVASC) 10 MG tablet Take 10 mg by mouth daily.   Yes [provider]  atorvastatin (LIPITOR) 20 MG tablet Take 1 tablet by mouth every evening. Patient taking differently: Take 20 mg by mouth at bedtime. 10/16/20  Yes  Kallie Locks, FNP  Cyanocobalamin (VITAMIN B-12 PO) Take 1 tablet by mouth daily.   Yes [provider]  donepezil (ARICEPT) 10 MG tablet Take 10 mg by mouth at bedtime. 12/26/19  Yes [provider]  esomeprazole (NEXIUM) 40 MG capsule TAKE 1 CAPSULE BY MOUTH DAILY BEFORE BREAKFAST. Patient taking differently: Take 40 mg by mouth daily. 09/19/17  Yes Bing Neighbors, NP  fluticasone (FLONASE) 50 MCG/ACT nasal spray Place 2 sprays into both nostrils daily.   Yes [provider]  losartan (COZAAR) 50 MG tablet Take 50 mg by mouth daily.   Yes [provider]  mirtazapine (REMERON) 15 MG tablet Take 15 mg by mouth at bedtime.   Yes [provider]     Critical care time:         CRITICAL CARE Performed by: Darcella Gasman Kieley Akter   Total critical care time: 45 minutes  Critical care time was exclusive of separately billable procedures and treating other patients.  Critical care was necessary to treat or prevent imminent or life-threatening deterioration.  Critical care was time spent personally by me on the following activities: development of treatment plan with patient and/or surrogate as well as nursing, discussions with consultants, evaluation of patient's response to treatment, examination of patient, obtaining history from patient or surrogate, ordering and performing treatments and interventions, ordering and review of laboratory studies, ordering and review of radiographic studies, pulse oximetry and re-evaluation of patient's condition.  Darcella Gasman Janet Decesare, PA-C [facility] See Amion for pager If no response to pager , please call [phone] until 7pm After 7:00 pm call Elink  [phone]

## 2023-12-26 NOTE — ED Notes (Signed)
 Returned from lunch to find pt in afib 13-160's. Dr. Eloise Harman made aware. EKG obtained and exported by ED RN

## 2023-12-26 NOTE — ED Triage Notes (Signed)
 EMS from home with c/o general weakness and shaking x 2 days. Per family, pt is known ETOH abuse and stopped drinking 1 week ago. + N,V,D x 2 days as well. Pt A&Ox3, GCS 14. Able to state needs and follow commands.  PMH: Dementia ETOH HTN  Chronic back pain

## 2023-12-26 NOTE — Sepsis Progress Note (Signed)
 eLink is following this Code Sepsis.
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
## 2023-12-26 NOTE — ED Provider Notes (Signed)
 [place] [facility]GENC[facility] DE[facility]MENT AT [facility] Provider Note   CSN: [id number] Arrival date & time: 12/26/23  0845     History  Chief Complaint  Patient presents with   Fatigue   Weakness   Alcohol Problem    Jorge Martin is a 89 y.o. male.  89 year old male with history of mild Alzheimer's dementia, alcohol abuse, and hypertension who presents to the emergency department with generalized weakness, shaking, nausea, vomiting, and diarrhea.  Stopped drinking alcohol 9 days ago.  Was drinking a pint a day prior to this.  Since then has felt generally weak.  Has been having some shakes.  Yesterday started having nausea and vomiting and diarrhea.  Says that his diarrhea appeared black.  Not on any blood thinners.  He is not aware of any GI bleeds.  Did have a fall at home as well and says he is having some difficulty walking but denies any focal weakness and says it is more of a generalized weakness.  No head strike or injuries.       Home Medications Prior to Admission medications   Medication Sig Start Date End Date Taking? Authorizing Provider  acetaminophen (TYLENOL) 500 MG tablet Take 1 tablet (500 mg total) by mouth in the morning and at bedtime. (May increase dose to 2 tablets (1,000 mg), 2 times a day if above dose is not effective). Patient taking differently: Take 500 mg by mouth 2 (two) times daily as needed for moderate pain (pain score 4-6), fever or headache. 02/07/20  Yes Kallie Locks, FNP  amLODipine (NORVASC) 10 MG tablet Take 10 mg by mouth daily.   Yes [provider]  atorvastatin (LIPITOR) 20 MG tablet Take 1 tablet by mouth every evening. Patient taking differently: Take 20 mg by mouth at bedtime. 10/16/20  Yes Kallie Locks, FNP  Cyanocobalamin (VITAMIN B-12 PO) Take 1 tablet by mouth daily.   Yes [provider]  donepezil (ARICEPT) 10 MG tablet Take 10 mg by mouth at bedtime. 12/26/19  Yes [provider]   esomeprazole (NEXIUM) 40 MG capsule TAKE 1 CAPSULE BY MOUTH DAILY BEFORE BREAKFAST. Patient taking differently: Take 40 mg by mouth daily. 09/19/17  Yes Bing Neighbors, NP  fluticasone (FLONASE) 50 MCG/ACT nasal spray Place 2 sprays into both nostrils daily.   Yes [provider]  losartan (COZAAR) 50 MG tablet Take 50 mg by mouth daily.   Yes [provider]  mirtazapine (REMERON) 15 MG tablet Take 15 mg by mouth at bedtime.   Yes [provider]      Allergies    Patient has no known allergies.    Review of Systems   Review of Systems  Physical Exam Updated Vital Signs BP 112/84   Pulse 77   Temp 98.3 F (36.8 C) (Oral)   Resp 17   Ht 5\' 10"  (1.778 m)   Wt 69.4 kg   SpO2 100%   BMI 21.95 kg/m  Physical Exam Vitals and nursing note reviewed.  Constitutional:      General: He is not in acute distress.    Appearance: He is well-developed.     Comments: And oriented x 3.  Mild tremor and tongue fasciculation.  HENT:     Head: Normocephalic and atraumatic.     Right Ear: External ear normal.     Left Ear: External ear normal.     Nose: Nose normal.     Mouth/Throat:  Comments: Poor dentition Eyes:     Extraocular Movements: Extraocular movements intact.     Conjunctiva/sclera: Conjunctivae normal.     Pupils: Pupils are equal, round, and reactive to light.     Comments: 3 mm bilaterally  Cardiovascular:     Rate and Rhythm: Normal rate and regular rhythm.     Heart sounds: Normal heart sounds.  Pulmonary:     Effort: Pulmonary effort is normal. No respiratory distress.     Breath sounds: Normal breath sounds.  Abdominal:     General: There is distension.     Palpations: Abdomen is soft. There is no mass.     Tenderness: There is no abdominal tenderness. There is no guarding.  Genitourinary:    Comments: Chaperoned by patient's tech [place]. No external hemorrhoids or fissures noted on external inspection. Normal rectal tone. Brown  stool in rectal vault. No melena or gross blood.  Musculoskeletal:     Cervical back: Normal range of motion and neck supple.     Right lower leg: No edema.     Left lower leg: No edema.  Skin:    General: Skin is warm and dry.  Neurological:     Mental Status: He is alert and oriented to person, place, and time.     Cranial Nerves: No cranial nerve deficit.     Sensory: No sensory deficit.     Motor: Weakness (Antigravity in all 4 extremities) present.  Psychiatric:        Mood and Affect: Mood normal.        Behavior: Behavior normal.     ED Results / Procedures / Treatments   Labs (all labs ordered are listed, but only abnormal results are displayed) Labs Reviewed  COMPREHENSIVE METABOLIC PANEL WITH GFR - Abnormal; Notable for the following components:      Result Value   CO2 9 (*)    Glucose, Bld 135 (*)    BUN 33 (*)    Creatinine, Ser 3.18 (*)    Albumin 3.4 (*)    AST 1,997 (*)    ALT 1,128 (*)    Total Bilirubin 3.4 (*)    GFR, Estimated 18 (*)    Anion gap 27 (*)    All other components within normal limits  CBC WITH DIFFERENTIAL/PLATELET - Abnormal; Notable for the following components:   RDW 18.5 (*)    Abs Immature Granulocytes 0.19 (*)    All other components within normal limits  URINALYSIS, ROUTINE W REFLEX MICROSCOPIC - Abnormal; Notable for the following components:   Color, Urine AMBER (*)    APPearance HAZY (*)    Protein, ur 100 (*)    Leukocytes,Ua TRACE (*)    Bacteria, UA RARE (*)    All other components within normal limits  CK - Abnormal; Notable for the following components:   Total CK 32 (*)    All other components within normal limits  LIPASE, BLOOD - Abnormal; Notable for the following components:   Lipase 76 (*)    All other components within normal limits  GLUCOSE, CAPILLARY - Abnormal; Notable for the following components:   Glucose-Capillary 153 (*)    All other components within normal limits  CBG MONITORING, ED - Abnormal;  Notable for the following components:   Glucose-Capillary 116 (*)    All other components within normal limits  I-STAT VENOUS BLOOD GAS, ED - Abnormal; Notable for the following components:   pH, Ven 7.174 (*)    pCO2,  Ven 34.7 (*)    pO2, Ven 58 (*)    Bicarbonate 12.8 (*)    TCO2 14 (*)    Acid-base deficit 15.0 (*)    Calcium, Ion 1.06 (*)    All other components within normal limits  I-STAT CG4 LACTIC ACID, ED - Abnormal; Notable for the following components:   Lactic Acid, Venous 8.9 (*)    All other components within normal limits  I-STAT CG4 LACTIC ACID, ED - Abnormal; Notable for the following components:   Lactic Acid, Venous 7.0 (*)    All other components within normal limits  RESP PANEL BY RT-PCR (RSV, FLU A&B, COVID)  [id number]  CULTURE, BLOOD (ROUTINE X 2)  CULTURE, BLOOD (ROUTINE X 2)  MRSA NEXT GEN BY PCR, NASAL  AMMONIA  RAPID URINE DRUG SCREEN, HOSP PERFORMED  ETHANOL  BLOOD GAS, VENOUS  ACETAMINOPHEN LEVEL  TSH  APTT  COMPREHENSIVE METABOLIC PANEL WITH GFR  MAGNESIUM  HEPATITIS PANEL, ACUTE  PROTIME-INR  LACTIC ACID, PLASMA  LACTIC ACID, PLASMA  BLOOD GAS, VENOUS  POC OCCULT BLOOD, ED  CBG MONITORING, ED  TYPE AND SCREEN    EKG EKG Interpretation Date/Time:  Friday December 26 2023 12:47:54 EDT Ventricular Rate:  136 PR Interval:  158 QRS Duration:  78 QT Interval:  274 QTC Calculation: 413 R Axis:   73  Text Interpretation: Atrial fibrillation Ventricular premature complex Repolarization abnormality, prob rate related Confirmed by Vonita Moss [phone]) on 12/26/2023 1:08:19 PM  Radiology CT ABDOMEN PELVIS WO CONTRAST Result Date: 12/26/2023 CLINICAL DATA:  nvd, abd pain EXAM: CT ABDOMEN AND PELVIS WITHOUT CONTRAST TECHNIQUE: Multidetector CT imaging of the abdomen and pelvis was performed following the standard protocol without IV contrast. Of note, the lack of intravenous contrast limits evaluation of the solid organ parenchyma and vascularity.  RADIATION DOSE REDUCTION: This exam was performed according to the departmental dose-optimization program which includes automated exposure control, adjustment of the mA and/or kV according to patient size and/or use of iterative reconstruction technique. COMPARISON:  March 30, 2021, June 19, 2022 FINDINGS: Lower chest: No focal airspace consolidation or pleural effusion.Elevation of the right hemidiaphragm with subsegmental and posterior right basilar dependent atelectasis. Hepatobiliary: Severe diffuse hepatic steatosis. No mass.The gallbladder is distended and diffusely fluid-filled. Multiple small radiopaque stones. Subtle inflammation within the porta hepatis and gallbladder neck/downstream body.No intrahepatic or extrahepatic biliary ductal dilation. No obstructive, radiopaque choledocholithiasis. Pancreas: Mild parenchymal atrophy. No mass. Subtle fatty induration about the pancreatic head and uncinate process.No well-formed or drainable peripancreatic fluid collection. Spleen: Normal size. No mass. Adrenals/Urinary Tract: No adrenal masses. No mass. A few small cysts noted in both kidneys. No hydronephrosis or nephrolithiasis. Mild circumferential wall thickening of the urinary bladder. Stomach/Bowel:Small sliding-type hiatal hernia. The stomach contains ingested material without focal abnormality. No small bowel wall thickening or inflammation. No small bowel obstruction. Normal appendix. Vascular/Lymphatic: No aortic aneurysm. Diffuse aortoiliac atherosclerosis. No intraabdominal or pelvic lymphadenopathy. Reproductive: Moderate prostatomegaly. Likely fiducial markers within the prostate gland.No free pelvic fluid. Other: No pneumoperitoneum, ascites, or mesenteric inflammation. Musculoskeletal: No acute fracture or destructive lesion.Small volume symmetric bilateral gynecomastia. Multilevel degenerative disc disease of the spine. Mild grade 1 anterolisthesis of L4 on L5. IMPRESSION: 1. Fluid-filled,  distended gallbladder with multiple radiopaque stones. There is induration of the pericholecystic fat around the gallbladder neck and downstream body with similar inflammation surrounding the pancreatic head and uncinate process. This could represent changes of early acute cholecystitis or acute interstitial edematous pancreatitis. Alternatively, these changes  could also reflect findings of acute hepatitis. Correlation with serum lipase and liver enzymes recommended. 2. Circumferential wall thickening of the urinary bladder, which may be due to underdistension, chronic bladder outlet obstruction, or acute cystitis. Correlation with urinalysis is recommended. 3. Moderate prostatomegaly. 4. Small sliding-type hiatal hernia. Electronically Signed   By: Wallie Char M.D.   On: 12/26/2023 14:11   US Abdomen Limited RUQ (LIVER/GB) Result Date: 12/26/2023 CLINICAL DATA:  Elevated liver function tests EXAM: ULTRASOUND ABDOMEN LIMITED RIGHT UPPER QUADRANT COMPARISON:  CT abdomen pelvis 06/19/2022 FINDINGS: Gallbladder: Gallstones: Present Sludge: Present Gallbladder Wall: Within normal limits Pericholecystic fluid: None Sonographic Murphy's Sign: Negative per technologist Common bile duct: Diameter: 10 mm Liver: Parenchymal echogenicity: Diffusely increased Contours: Normal Lesions: None Portal vein: Patent.  Hepatopetal flow Other: None. IMPRESSION: 1. Cholelithiasis without additional sonographic evidence of acute cholecystitis. 2. Dilated common bile duct measuring up to 10 mm. If the patient's liver function tests are suggestive of biliary obstruction, further evaluation with MRI/MRCP should be performed. 3. Diffuse increased echogenicity of the hepatic parenchyma is a nonspecific indicator of hepatocellular dysfunction, most commonly steatosis. Electronically Signed   By: Acquanetta Belling M.D.   On: 12/26/2023 12:19   DG Chest Port 1 View Result Date: 12/26/2023 CLINICAL DATA:  Possible sepsis. EXAM: PORTABLE CHEST  1 VIEW COMPARISON:  07/25/2014 FINDINGS: Normal sized heart. Mildly tortuous aorta. Stable large right diaphragmatic eventration with minimal right basilar atelectasis/scarring. Clear left lung. Unremarkable bones. IMPRESSION: 1. No acute abnormality. 2. Stable large right diaphragmatic eventration with minimal right basilar atelectasis/scarring. Electronically Signed   By: Beckie Salts M.D.   On: 12/26/2023 10:39   CT HEAD WO CONTRAST Result Date: 12/26/2023 CLINICAL DATA:  Tremors EXAM: CT HEAD WITHOUT CONTRAST TECHNIQUE: Contiguous axial images were obtained from the base of the skull through the vertex without intravenous contrast. RADIATION DOSE REDUCTION: This exam was performed according to the departmental dose-optimization program which includes automated exposure control, adjustment of the mA and/or kV according to patient size and/or use of iterative reconstruction technique. COMPARISON:  Brain MRI 09/12/2017 FINDINGS: Brain: No evidence of acute infarction, hemorrhage, hydrocephalus, extra-axial collection or mass lesion/mass effect. Generalized brain atrophy similar to prior. Mild chronic small vessel ischemia in the deep white matter. Vascular: No hyperdense vessel or unexpected calcification. Skull: Normal. Negative for fracture or focal lesion. Sinuses/Orbits: No acute finding. IMPRESSION: Aging brain without acute or reversible finding. No convincing change since a 2018 MRI. Electronically Signed   By: Tiburcio Pea M.D.   On: 12/26/2023 10:01    Procedures Procedures    Medications Ordered in ED Medications  pantoprazole (PROTONIX) injection 40 mg (40 mg Intravenous Given 12/26/23 1133)    Followed by  pantoprazole (PROTONIX) injection 40 mg (has no administration in time range)  docusate sodium (COLACE) capsule 100 mg (has no administration in time range)  polyethylene glycol (MIRALAX / GLYCOLAX) packet 17 g (has no administration in time range)  thiamine (VITAMIN B1) tablet 100  mg (has no administration in time range)  multivitamin with minerals tablet 1 tablet (has no administration in time range)  amiodarone (NEXTERONE) 1.8 mg/mL load via infusion 150 mg (150 mg Intravenous Bolus from Bag 12/26/23 1546)    Followed by  amiodarone (NEXTERONE PREMIX) 360-4.14 MG/200ML-% (1.8 mg/mL) IV infusion (60 mg/hr Intravenous Infusion Verify 12/26/23 1600)    Followed by  amiodarone (NEXTERONE PREMIX) 360-4.14 MG/200ML-% (1.8 mg/mL) IV infusion (has no administration in time range)  atorvastatin (LIPITOR) tablet 20 mg (  has no administration in time range)  donepezil (ARICEPT) tablet 10 mg (has no administration in time range)  amiodarone (NEXTERONE PREMIX) 360-4.14 MG/200ML-% (1.8 mg/mL) IV infusion (has no administration in time range)  metroNIDAZOLE (FLAGYL) IVPB 500 mg (has no administration in time range)  lactated ringers bolus 1,000 mL (0 mLs Intravenous Stopped 12/26/23 1338)    And  lactated ringers bolus 1,000 mL (0 mLs Intravenous Stopped 12/26/23 1338)  ceFEPIme (MAXIPIME) 2 g in sodium chloride 0.9 % 100 mL IVPB (0 g Intravenous Stopped 12/26/23 1300)  metroNIDAZOLE (FLAGYL) IVPB 500 mg (0 mg Intravenous Stopped 12/26/23 1501)  vancomycin (VANCOCIN) IVPB 1000 mg/200 mL premix (0 mg Intravenous Stopped 12/26/23 1618)  LORazepam (ATIVAN) injection 1 mg (1 mg Intravenous Given 12/26/23 1126)  ondansetron (ZOFRAN) injection 4 mg (4 mg Intravenous Given 12/26/23 1126)  diltiazem (CARDIZEM) 1 mg/mL load via infusion 15 mg (15 mg Intravenous Bolus from Bag 12/26/23 1346)  lactated ringers bolus 500 mL (500 mLs Intravenous New Bag/Given 12/26/23 1430)    ED Course/ Medical Decision Making/ A&P Clinical Course as of 12/26/23 1656  Fri Dec 26, 2023  1309 DNR/DNI confirmed with daughters at the bedside.  [RP]  1316 Grace from the ICU consulted.  They are sending someone down to evaluate the patient shortly. [RP]    Clinical Course User Index [RP] Rondel Baton, MD                                  Medical Decision Making Amount and/or Complexity of Data Reviewed Labs: ordered. Radiology: ordered.  Risk Prescription drug management. Decision regarding hospitalization.   GLENDELL FOUSE is a 89 y.o. male with comorbidities that complicate the patient evaluation including mild Alzheimer's dementia, alcohol abuse, and hypertension who presents to the emergency department with generalized weakness, shaking, nausea, vomiting, and diarrhea.    Initial Ddx:  Alcohol withdrawal, sepsis, URI, pneumonia, intra-abdominal abscess/infection, electrolyte abnormality  MDM/Course:  Patient presents emergency department with nausea vomiting and diarrhea.  This is in the setting of alcohol cessation approximately 9 days ago.  Does report some black appearing vomit and stool.  On exam is chronically ill-appearing but not in distress and initially had soft blood pressure that spontaneously improved prior to my evaluation.  He had blood work that was sent that shows that he has a lactate of 8.  Repeat lactate was undetectably high.  Given 30 mL/kg of IV fluids and started on broad-spectrum antibiotics.  Shows that he has a pH of 7.17 at this point in time.  Labs show that he has elevated LFTs and bilirubin with a normal alkaline phosphatase.  Also has an AKI.  Unfortunately due to his renal function is unable to get a contrasted CT so underwent a right upper quadrant ultrasound and CT of the abdomen pelvis.  CT showed possible acute cholecystitis and/or pancreatitis but right upper quadrant ultrasound showed gallstones without cholecystitis and enlarged common bile duct.  Concern for possible cholangitis or choledocholithiasis.  Patient was discussed with anemia [place] from GI and Leary Roca from general surgery.  Patient later went into atrial fibrillation with RVR.  Started on diltiazem and was admitted to the ICU by Dr. Isaiah Serge.  Was confirmed DNR/DNI with the patient and his  daughters prior to admit.   This patient presents to the ED for concern of complaints listed in HPI, this involves an extensive number of treatment options,  and is a complaint that carries with it a high risk of complications and morbidity. Disposition including potential need for admission considered.   Dispo: ICU  Additional history obtained from daughter Records reviewed Outpatient Clinic Notes The following labs were independently interpreted: Chemistry and show  elevated LFTs concerning for hepatitis or cholangitis I independently reviewed the following imaging with scope of interpretation limited to determining acute life threatening conditions related to emergency care: CT Abdomen/Pelvis and agree with the radiologist interpretation with the following exceptions: none I personally reviewed and interpreted cardiac monitoring: normal sinus rhythm  and atrial fibrillation with RVR I personally reviewed and interpreted the pt's EKG: see above for interpretation  I have reviewed the patients home medications and made adjustments as needed Consults: Critical care, Gastroenterology, and General Surgery Social Determinants of health:  Geriatric   Portions of this note were generated with Scientist, clinical (histocompatibility and immunogenetics). Dictation errors may occur despite best attempts at proofreading.    CRITICAL CARE Performed by: Rondel Baton   Total critical care time: 60 minutes  Critical care time was exclusive of separately billable procedures and treating other patients.  Critical care was necessary to treat or prevent imminent or life-threatening deterioration.  Critical care was time spent personally by me on the following activities: development of treatment plan with patient and/or surrogate as well as nursing, discussions with consultants, evaluation of patient's response to treatment, examination of patient, obtaining history from patient or surrogate, ordering and performing treatments and  interventions, ordering and review of laboratory studies, ordering and review of radiographic studies, pulse oximetry and re-evaluation of patient's condition.    Final Clinical Impression(s) / ED Diagnoses Final diagnoses:  Generalized weakness  Septic shock (HCC)  Atrial fibrillation with RVR (HCC)  Elevated LFTs  Alcohol withdrawal syndrome with complication [facility])    Rx / DC Orders ED Discharge Orders     None         Rondel Baton, MD 12/26/23 1656

## 2023-12-26 NOTE — Progress Notes (Addendum)
 Pharmacy Antibiotic Note  Jorge Martin is a 89 y.o. male admitted on 12/26/2023 with sepsis.  Pharmacy has been consulted for vancomycin and cefepime dosing.  Received cefepime, metronidazole, and vancomycin on 4/11 in ED - WBC 8.6, LA 7, afeb, CK 32. Scr elevated at 3.18 (CrCl 16 mL/min).   Plan: Given recent vancomycin 1g IV on 4/11@1457  (~14 mg/kg dose) - will order vancomycin random on 4/12 to assess clearance Start cefepime 2g IV every 24 hours Monitor renal fx, cx results, clinical pic, and vanc level as appropriate     Temp (24hrs), Avg:97.6 F (36.4 C), Min:97.6 F (36.4 C), Max:97.6 F (36.4 C)  Recent Labs  Lab 12/26/23 0900 12/26/23 1107 12/26/23 1340  WBC 8.6  --   --   CREATININE 3.18*  --   --   LATICACIDVEN  --  8.9* 7.0*    CrCl cannot be calculated (Unknown ideal weight.).    No Known Allergies  Antimicrobials this admission: Vancomycin  4/11 >>  Cefepime 4/11 >>  Metronidazole 4/11 >>  Dose adjustments this admission: N/A  Microbiology results: 4/11 BCx: sent 4/11 MRSA PCR: sent 4/11 COVID/Flu PCR: neg   Thank you for allowing pharmacy to participate in this patient's care,  Sherron Monday, PharmD, BCCCP Clinical Pharmacist  Phone: [phone] 12/26/2023 4:49 PM  Please check AMION for all [facility] Pharmacy phone numbers After 10:00 PM, call Main Pharmacy [phone]

## 2023-12-27 ENCOUNTER — Inpatient Hospital Stay (HOSPITAL_COMMUNITY)

## 2023-12-27 DIAGNOSIS — I4891 Unspecified atrial fibrillation: Secondary | ICD-10-CM

## 2023-12-27 DIAGNOSIS — R1011 Right upper quadrant pain: Secondary | ICD-10-CM

## 2023-12-27 DIAGNOSIS — R7989 Other specified abnormal findings of blood chemistry: Secondary | ICD-10-CM | POA: Diagnosis not present

## 2023-12-27 DIAGNOSIS — A419 Sepsis, unspecified organism: Secondary | ICD-10-CM | POA: Diagnosis not present

## 2023-12-27 DIAGNOSIS — K810 Acute cholecystitis: Secondary | ICD-10-CM | POA: Diagnosis not present

## 2023-12-27 DIAGNOSIS — R6521 Severe sepsis with septic shock: Secondary | ICD-10-CM | POA: Diagnosis not present

## 2023-12-27 DIAGNOSIS — E8721 Acute metabolic acidosis: Secondary | ICD-10-CM

## 2023-12-27 LAB — ECHOCARDIOGRAM COMPLETE
Height: 70 in
S' Lateral: 2.5 cm
Weight: 2479.73 [oz_av]

## 2023-12-27 LAB — PROTIME-INR
INR: 1.4 — ABNORMAL HIGH (ref 0.8–1.2)
Prothrombin Time: 17.7 s — ABNORMAL HIGH (ref 11.4–15.2)

## 2023-12-27 LAB — COMPREHENSIVE METABOLIC PANEL WITH GFR
ALT: 1525 U/L — ABNORMAL HIGH (ref 0–44)
AST: 2319 U/L — ABNORMAL HIGH (ref 15–41)
Albumin: 2.5 g/dL — ABNORMAL LOW (ref 3.5–5.0)
Alkaline Phosphatase: 70 U/L (ref 38–126)
Anion gap: 23 — ABNORMAL HIGH (ref 5–15)
BUN: 49 mg/dL — ABNORMAL HIGH (ref 8–23)
CO2: 14 mmol/L — ABNORMAL LOW (ref 22–32)
Calcium: 8.2 mg/dL — ABNORMAL LOW (ref 8.9–10.3)
Chloride: 102 mmol/L (ref 98–111)
Creatinine, Ser: 2.73 mg/dL — ABNORMAL HIGH (ref 0.61–1.24)
GFR, Estimated: 22 mL/min — ABNORMAL LOW (ref >60)
Glucose, Bld: 110 mg/dL — ABNORMAL HIGH (ref 70–99)
Potassium: 3.6 mmol/L (ref 3.5–5.1)
Sodium: 139 mmol/L (ref 135–145)
Total Bilirubin: 2.2 mg/dL — ABNORMAL HIGH (ref 0.0–1.2)
Total Protein: 5.5 g/dL — ABNORMAL LOW (ref 6.5–8.1)

## 2023-12-27 LAB — VANCOMYCIN, RANDOM: Vancomycin Rm: 9 ug/mL

## 2023-12-27 LAB — D-DIMER, QUANTITATIVE: D-Dimer, Quant: 9.02 ug{FEU}/mL — ABNORMAL HIGH (ref 0.00–0.50)

## 2023-12-27 LAB — HIV ANTIBODY (ROUTINE TESTING W REFLEX): HIV Screen 4th Generation wRfx: NONREACTIVE

## 2023-12-27 MED ORDER — STERILE WATER FOR INJECTION IV SOLN
INTRAVENOUS | Status: AC
  Filled 2023-12-27 (×3): qty 1000

## 2023-12-27 MED ORDER — MORPHINE SULFATE (PF) 2 MG/ML IV SOLN
INTRAVENOUS | Status: AC
  Filled 2023-12-27: qty 1

## 2023-12-27 MED ORDER — ACETAMINOPHEN 650 MG RE SUPP: 650.0000 mg | RECTAL | Status: DC | PRN

## 2023-12-27 MED ORDER — PIPERACILLIN-TAZOBACTAM IN DEX 2-0.25 GM/50ML IV SOLN
2.2500 g | Freq: Three times a day (TID) | INTRAVENOUS | Status: DC
  Administered 2023-12-27 – 2023-12-31 (×13): 2.25 g via INTRAVENOUS
  Filled 2023-12-27 (×17): qty 50

## 2023-12-27 MED ORDER — TECHNETIUM TC 99M MEBROFENIN IV KIT
5.3000 | PACK | Freq: Once | INTRAVENOUS | Status: AC | PRN
  Administered 2023-12-27: 5.3 via INTRAVENOUS

## 2023-12-27 MED ORDER — PERFLUTREN LIPID MICROSPHERE
1.0000 mL | INTRAVENOUS | Status: AC | PRN
  Administered 2023-12-27: 5 mL via INTRAVENOUS

## 2023-12-27 MED ORDER — MORPHINE SULFATE (PF) 2 MG/ML IV SOLN
2.5000 mg | Freq: Once | INTRAVENOUS | Status: AC
  Administered 2023-12-27: 2.5 mg via INTRAVENOUS

## 2023-12-27 MED ORDER — ORAL CARE MOUTH RINSE: 15.0000 mL | OROMUCOSAL | Status: DC | PRN

## 2023-12-27 NOTE — Progress Notes (Signed)
 [facility] Progress Note  (covering for Drs. Mann/Hung)  Chief Complaint: Elevated LFTs, abnormal imaging with dilated gallbladder  History:  Signout received from Dr. Nickey Barn, chart review performed, patient seen and examined. Patient is somnolent today and unable to give me any helpful history or review of systems. Difficult to tell if he has abdominal pain He has amiodarone drip and converted to sinus rhythm since admission  Objective:   Current Facility-Administered Medications:    acetaminophen (TYLENOL) suppository 650 mg, 650 mg, Rectal, Q4H PRN, Ogan, Okoronkwo U, MD   [COMPLETED] amiodarone (NEXTERONE) 1.8 mg/mL load via infusion 150 mg, 150 mg, Intravenous, Once, 150 mg at 12/26/23 1546 **FOLLOWED BY** [EXPIRED] amiodarone (NEXTERONE PREMIX) 360-4.14 MG/200ML-% (1.8 mg/mL) IV infusion, 60 mg/hr, Intravenous, Continuous, Stopped at 12/26/23 2239 **FOLLOWED BY** amiodarone (NEXTERONE PREMIX) 360-4.14 MG/200ML-% (1.8 mg/mL) IV infusion, 30 mg/hr, Intravenous, Continuous, Gleason, Patt Boozer, PA-C, Last Rate: 16.67 mL/hr at 12/27/23 0600, 30 mg/hr at 12/27/23 0600   atorvastatin (LIPITOR) tablet 20 mg, 20 mg, Oral, QHS, Gleason, Laura R, PA-C   ceFEPIme (MAXIPIME) 2 g in sodium chloride 0.9 % 100 mL IVPB, 2 g, Intravenous, Q24H, Hurth, Kimberly P, RPH   Chlorhexidine Gluconate Cloth 2 % PADS 6 each, 6 each, Topical, Daily, Mannam, Praveen, MD   docusate sodium (COLACE) capsule 100 mg, 100 mg, Oral, BID PRN, Gleason, Patt Boozer, PA-C   donepezil (ARICEPT) tablet 10 mg, 10 mg, Oral, QHS, Gleason, Laura R, PA-C   metroNIDAZOLE (FLAGYL) IVPB 500 mg, 500 mg, Intravenous, Q12H, Ozie Bo, RPH, Stopped at 12/27/23 0118   multivitamin with minerals tablet 1 tablet, 1 tablet, Oral, Daily, Gleason, Patt Boozer, PA-C   Oral care mouth rinse, 15 mL, Mouth Rinse, PRN, Jaquita Merl P, DO   [EXPIRED] pantoprazole (PROTONIX) injection 40 mg, 40 mg, Intravenous, Q5 min, 40 mg at 12/26/23 1133 **FOLLOWED  BY** pantoprazole (PROTONIX) injection 40 mg, 40 mg, Intravenous, Q12H, Ninetta Basket, MD, 40 mg at 12/26/23 2129   polyethylene glycol (MIRALAX / GLYCOLAX) packet 17 g, 17 g, Oral, Daily PRN, Gleason, Patt Boozer, PA-C   thiamine (VITAMIN B1) tablet 100 mg, 100 mg, Oral, Daily, Gleason, Rice Chamorro R, PA-C   vancomycin variable dose per unstable renal function (pharmacist dosing), , Does not apply, See admin instructions, Ozie Bo, RPH   amiodarone 30 mg/hr (12/27/23 0600)   ceFEPime (MAXIPIME) IV     metronidazole Stopped (12/27/23 0118)     Vital signs in last 24 hrs: Vitals:   12/27/23 0600 12/27/23 0745  BP: 108/70   Pulse: 62   Resp: 16   Temp:  99.6 F (37.6 C)  SpO2: 100%     Intake/Output Summary (Last 24 hours) at 12/27/2023 0826 Last data filed at 12/27/2023 0600 Gross per 24 hour  Intake 3467.56 ml  Output 310 ml  Net 3157.56 ml     Physical Exam  HEENT: sclera anicteric, oral mucosa without lesions Neck: supple, no thyromegaly, JVD or lymphadenopathy Cardiac: RRR without murmurs, S1S2 heard, no peripheral edema Pulm: clear to auscultation bilaterally, normal RR and effort noted Abdomen: soft, right upper quadrant tenderness, with active bowel sounds. No guarding or palpable hepatosplenomegaly Skin; warm and dry, no jaundice  Recent Labs:     Latest Ref Rng & Units 12/26/2023    9:23 AM 12/26/2023    9:00 AM 05/31/2022   11:30 AM  CBC  WBC 4.0 - 10.5 K/uL  8.6  5.7   Hemoglobin 13.0 - 17.0 g/dL 14.6  13.5  11.6   Hematocrit 39.0 - 52.0 % 43.0  43.2  36.2   Platelets 150 - 400 K/uL  189  213     Recent Labs  Lab 12/26/23 1642  INR 1.7*      Latest Ref Rng & Units 12/26/2023    4:42 PM 12/26/2023    9:23 AM 12/26/2023    9:00 AM  CMP  Glucose 70 - 99 mg/dL 782   956   BUN 8 - 23 mg/dL 36   33   Creatinine 2.13 - 1.24 mg/dL 0.86   5.78   Sodium 469 - 145 mmol/L 139  137  140   Potassium 3.5 - 5.1 mmol/L 3.9  4.0  4.3   Chloride 98 - 111 mmol/L  105   100   CO2 22 - 32 mmol/L 14   9   Calcium 8.9 - 10.3 mg/dL 8.3   9.5   Total Protein 6.5 - 8.1 g/dL 5.4   6.9   Total Bilirubin 0.0 - 1.2 mg/dL 2.5   3.4   Alkaline Phos 38 - 126 U/L 62   84   AST 15 - 41 U/L 1,866   1,997   ALT 0 - 44 U/L 1,076   1,128    No INR ordered today (confirmed with nursing)  Radiologic studies:   Assessment & Plan  Assessment: Critically ill man admitted with altered mental status that persists, rapid A-fib with hypotension that is now treated and converted to sinus rhythm.  He was acidemic with acute kidney injury markedly elevated lactate overnight and significantly elevated LFTs with a mixed transaminitis and cholestatic pattern but normal alkaline phosphatase and no extrahepatic biliary ductal dilatation on his CT scan. Gallbladder markedly enlarged with stones and sludge and some fluid around it.  I appreciate our surgical consultants opinion yesterday.  I disagree that this patient has cholecystitis, I think he has acute cholecystitis that is led to a septic picture and probably sent him into rapid A-fib that led to hypotension then causing superimposed ischemic hepatopathy.  He had a coagulopathy on admission with INR 1.7 which could at least in part be due to the Eliquis he was taking, but there could be some synthetic dysfunction from liver injury.  He is on broad-spectrum antibiotics, WBC is normal but that is not entirely reassuring to rule out infection in an elderly patient. Renal function improving somewhat with volume resuscitation and supportive measures.  Transaminases very similar to yesterday, down just a little.  Bilirubin is decreasing and alkaline phosphatase remains normal.  Plan: PT/INR ordered stat (he needs 1 daily)  We have ordered a stat HIDA scan.  I understand it may have some limitations if the tracer has delayed excretion due to some cholestasis from this illness, but if if the extrahepatic biliary tree and small bowel are  seen on it with no visualization of gallbladder, then this man needs a percutaneous cholecystostomy tube.  (All the more reason to check his INR again today).   35 minutes were spent on this encounter (including chart review, history/exam, counseling/coordination of care, and documentation) > 50% of that time was spent on counseling and coordination of care.    Kerby Pearson III Office: [phone]

## 2023-12-27 NOTE — Progress Notes (Signed)
 Daughter Alois Arnt updated at bedside today and daughter Zita Hind was updated via phone after HIDA results to let her know plan for perc chole tomorrow morning.   Joesph Mussel, DO 12/27/23 6:13 PM [facility]  For contact information, see Amion. If no response to pager, please call PCCM consult pager. After hours, 7PM- 7AM, please call Elink.

## 2023-12-27 NOTE — Progress Notes (Signed)
 NAME:  Jorge Martin, MRN:  [id number], DOB:  1935/03/04, LOS: 1 ADMISSION DATE:  12/26/2023, CONSULTATION DATE:  12/27/23 REFERRING MD:  Efraim Grange, CHIEF COMPLAINT:  weakness    History of Present Illness:  Jorge Martin is a 89 y.o. M with PMH of HTN and early Alzheimer's, HTN, OSA, Prostate Ca, CKD 3b  who has a history of ETOH abuse.  His daughter's are at the bedside and report that he had quit drinking for some time and then picked it up again a couple of months ago.  He quit again cold [place] sometime in the last 7-9 days.  He has not felt like eating much in that time, he had some tremors but no confusion (at baseline he just has mild memory loss and takes care of himself).  He denies fevers, chills or abdominal pain.  He was so weak that his family brought him to the ED this morning.  Since arrival he has vomited several times in the ED and converted to atrial fibrillation with RVR.  Work-up was significant for elevated lactic acid of 8.9, pH 7.17, creatinine 3.1 (baseline 1.2-1.4), bili 3.4, AST 1997, ALT 1128, WBC 8.6.  CT abd/pelvis significant for cholelithiasis without evidence of cholecystitis  Pertinent  Medical History   has a past medical history of Arthritis, ED (erectile dysfunction), Elevated serum creatinine (11/2020), GERD (gastroesophageal reflux disease), Headache, History of alcoholism (HCC), History of gout (09/2011), Hypercholesteremia, Hyperplasia of prostate with lower urinary tract symptoms (LUTS), Hypertension, OSA on CPAP (followed by dr Omar Bibber), Pre-diabetes, Prostate cancer [facility]) (urologist-  dr Claretta Croft  onologist-  dr Lorri Rota), Seasonal allergic rhinitis, and Weak urinary stream.   Significant Hospital Events: Including procedures, antibiotic start and stop dates in addition to other pertinent events   4/11 admit to ICU with AKI, cholecystitis and Afib  Interim History / Subjective:  Denies complaints. Remained off pressors.  Tmax 99.6.   Objective   Blood  pressure 111/80, pulse 77, temperature 99.6 F (37.6 C), temperature source Oral, resp. rate (!) 21, height 5\' 10"  (1.778 m), weight 70.3 kg, SpO2 99%.        Intake/Output Summary (Last 24 hours) at 12/27/2023 1348 Last data filed at 12/27/2023 0900 Gross per 24 hour  Intake 3516.59 ml  Output 310 ml  Net 3206.59 ml   Filed Weights   12/26/23 1600 12/27/23 0500  Weight: 69.4 kg 70.3 kg    General:  chronically ill, frail appearing man lying in bed in NAD HEENT: [place]/At, eyes anicteric Neuro: sleepy, confused, arouses to verbal stimulation CV: S1S2, RRR, in NSR on amiodarone PULM: breathing comfortably on , CTAB GI: soft, ND Extremities: no significant edema, no cyanosis Skin: warm, dry, no rashes  LA 7>3 Bicarb 14, AG 23 BUN 49  Cr 2.73 AST pending ALT 1525 T bili 2.2 INR 1.4 Blood cultures> NGTD Echo: LVEF 60-65%, severely enlarged RV, normal PASP. Trivial MR,  IVC normal size and variability.    Resolved Hospital Problem list    Coagulopathy due to liver dysfunction, resolved  Assessment & Plan:   Septic shock by LA criteria; concern for cholelithiasis and choledocholithiasis Lactic acidosis due to sepsis -HIDA per GI's recommendations; d/w IR, planning for perc chole tomorrow morning. Has another emergent case right now.  -Appreciate surgery and GI's management -Continue antibiotics to cover GNR & anaerobes- switch to zosyn -hold PTA antihypertensives  New- onset atrial fibrillation with RVR; back in NSR currently. Suspect this is due to acute illness. TSH WNL. -  Con't amiodarone; did not tolerate diltiazem -Eventually needs heparin; will wait until we determine if HIDA is positive -volume resuscitated -monitor on tele  RV dilation on echo; unsure of chronicity. Luckily no PH by measurement. -check D-dimer & LE US , but would hold on AC since unsure chronicity and needs procedure tomorrow  Acute respiratory failure with hypoxia -wean O2 as able -pulmonary  hygiene  AKI superimposed on CKD 3b Acute on chronic metabolic acidosis; not resolved with improving lactic acidosis -add bicarb gtt; receheck in AM -foley for strict I/O -renally dose meds, avoid nephrotoxic meds -unfortunately will need contrast for perc drain tomorrow, but not other good options -maintain adequate perfusion  ETOH abuse and withdrawal- at least 7 days after last drink, not currently in DT's -vitamins -CIWA; has been sleeping rather than agitated  Baseline OSA -CPAP at bedtime when alert and not having nausea  Dementia  -continue PTA Aricept   H/o HTN -hold PTA antihypertensives  HLD -con't PTA statin  H/o Prediabetes -check A1c tomorrow   Best Practice (right click and "Reselect all SmartList Selections" daily)   Diet/type: NPO w/ oral meds DVT prophylaxis SCD Pressure ulcer(s): N/A GI prophylaxis: PPI Lines: N/A Foley:  Yes, and it is still needed Code Status:  DNR Last date of multidisciplinary goals of care discussion [Daughters updated at the bedside, 4/11]  Labs   CBC: Recent Labs  Lab 12/26/23 0900 12/26/23 0923  WBC 8.6  --   NEUTROABS 6.8  --   HGB 13.5 14.6  HCT 43.2 43.0  MCV 98.0  --   PLT 189  --     Basic Metabolic Panel: Recent Labs  Lab 12/26/23 0900 12/26/23 0923 12/26/23 1642 12/27/23 1153  NA 140 137 139 139  K 4.3 4.0 3.9 3.6  CL 100  --  105 102  CO2 9*  --  14* 14*  GLUCOSE 135*  --  163* 110*  BUN 33*  --  36* 49*  CREATININE 3.18*  --  2.80* 2.73*  CALCIUM 9.5  --  8.3* 8.2*  MG  --   --  1.1*  --    GFR: Estimated Creatinine Clearance: 19 mL/min (A) (by C-G formula based on SCr of 2.73 mg/dL (H)). Recent Labs  Lab 12/26/23 0900 12/26/23 1107 12/26/23 1340 12/26/23 1914  WBC 8.6  --   --   --   LATICACIDVEN  --  8.9* 7.0* 3.0*    Liver Function Tests: Recent Labs  Lab 12/26/23 0900 12/26/23 1642 12/27/23 1153  AST 1,997* 1,866* PENDING  ALT 1,128* 1,076* 1,525*  ALKPHOS 84 62 70   BILITOT 3.4* 2.5* 2.2*  PROT 6.9 5.4* 5.5*  ALBUMIN 3.4* 2.6* 2.5*   Recent Labs  Lab 12/26/23 0900  LIPASE 76*   Recent Labs  Lab 12/26/23 0933  AMMONIA 35     Critical care time:        Jorge Mussel, DO 12/27/23 6:10 PM [facility]  For contact information, see Amion. If no response to pager, please call PCCM consult pager. After hours, 7PM- 7AM, please call Elink.

## 2023-12-27 NOTE — Progress Notes (Signed)
 Pharmacy Antibiotic Note  Jorge Martin is a 89 y.o. male admitted on 12/26/2023 with sepsis 2/2 cholecystitis. Pharmacy has been consulted for vancomycin and cefepime dosing.  Vancomycin random 9 mcg/ml, Cr remains elevated. MRSA swab neg.  Plan: Stop vancomycin, metronidazole, cefepime Start Zosyn 2.25g IV q8h   Height: 5\' 10"  (177.8 cm) Weight: 70.3 kg (154 lb 15.7 oz) IBW/kg (Calculated) : 73  Temp (24hrs), Avg:98.5 F (36.9 C), Min:97.6 F (36.4 C), Max:99.6 F (37.6 C)  Recent Labs  Lab 12/26/23 0900 12/26/23 1107 12/26/23 1340 12/26/23 1642 12/26/23 1914  WBC 8.6  --   --   --   --   CREATININE 3.18*  --   --  2.80*  --   LATICACIDVEN  --  8.9* 7.0*  --  3.0*    Estimated Creatinine Clearance: 18.5 mL/min (A) (by C-G formula based on SCr of 2.8 mg/dL (H)).    No Known Allergies  Antimicrobials this admission: Vancomycin  4/11 >>  Cefepime 4/11 >>  Metronidazole 4/11 >>  Dose adjustments this admission: N/A  Microbiology results: 4/11 BCx: sent 4/11 MRSA PCR: sent 4/11 COVID/Flu PCR: neg     Levin Reamer, PharmD, BCPS, [facility] Clinical Pharmacist [phone] Please check AMION for all [facility] Pharmacy numbers 12/27/2023

## 2023-12-27 NOTE — Progress Notes (Signed)
 Echocardiogram 2D Echocardiogram has been performed.  Emmaline Haring Tareek Sabo RDCS 12/27/2023, 12:13 PM

## 2023-12-27 NOTE — Progress Notes (Addendum)
 Subjective: CC: Nausea. No abdominal pain.   Objective: Vital signs in last 24 hours: Temp:  [97.6 F (36.4 C)-99.6 F (37.6 C)] 99.6 F (37.6 C) (04/12 0745) Pulse Rate:  [44-140] 62 (04/12 0600) Resp:  [14-34] 16 (04/12 0600) BP: (81-132)/(60-113) 108/70 (04/12 0600) SpO2:  [81 %-100 %] 100 % (04/12 0600) Weight:  [69.4 kg-70.3 kg] 70.3 kg (04/12 0500) Last BM Date :  (pta)  Intake/Output from previous day: 04/11 0701 - 04/12 0700 In: 3467.6 [I.V.:465.8; IV Piggyback:3001.8] Out: 310 [Urine:310] Intake/Output this shift: No intake/output data recorded.  PE: Gen:  Alert, NAD, pleasant Abd: Soft, ND, RUQ ttp without peritonitis, otherwise NT. +BS  Lab Results:  Recent Labs    12/26/23 0900 12/26/23 0923  WBC 8.6  --   HGB 13.5 14.6  HCT 43.2 43.0  PLT 189  --    BMET Recent Labs    12/26/23 0900 12/26/23 0923 12/26/23 1642  NA 140 137 139  K 4.3 4.0 3.9  CL 100  --  105  CO2 9*  --  14*  GLUCOSE 135*  --  163*  BUN 33*  --  36*  CREATININE 3.18*  --  2.80*  CALCIUM 9.5  --  8.3*   PT/INR Recent Labs    12/26/23 1642  LABPROT 20.4*  INR 1.7*   CMP     Component Value Date/Time   NA 139 12/26/2023 1642   NA 141 11/13/2020 0928   K 3.9 12/26/2023 1642   CL 105 12/26/2023 1642   CO2 14 (L) 12/26/2023 1642   GLUCOSE 163 (H) 12/26/2023 1642   BUN 36 (H) 12/26/2023 1642   BUN 23 11/13/2020 0928   CREATININE 2.80 (H) 12/26/2023 1642   CREATININE 1.27 (H) 08/11/2017 1042   CALCIUM 8.3 (L) 12/26/2023 1642   PROT 5.4 (L) 12/26/2023 1642   PROT 6.9 11/13/2020 0928   ALBUMIN 2.6 (L) 12/26/2023 1642   ALBUMIN 4.5 11/13/2020 0928   AST 1,866 (H) 12/26/2023 1642   ALT 1,076 (H) 12/26/2023 1642   ALKPHOS 62 12/26/2023 1642   BILITOT 2.5 (H) 12/26/2023 1642   BILITOT 0.6 11/13/2020 0928   GFRNONAA 21 (L) 12/26/2023 1642   GFRNONAA 53 (L) 08/11/2017 1042   GFRAA 49 (L) 11/05/2019 1540   GFRAA 61 08/11/2017 1042   Lipase     Component  Value Date/Time   LIPASE 76 (H) 12/26/2023 0900    Studies/Results: DG CHEST PORT 1 VIEW Result Date: 12/27/2023 CLINICAL DATA:  Vomiting with weakness and fatigue. EXAM: PORTABLE CHEST 1 VIEW COMPARISON:  12/26/2023 FINDINGS: Similar asymmetric elevation right hemidiaphragm. Similar right infrahilar density compatible with right lower lung collapse/consolidation. Lungs otherwise clear. The cardio pericardial silhouette is enlarged. Telemetry leads overlie the chest. IMPRESSION: Similar right infrahilar density compatible with right lower lung collapse/consolidation. Electronically Signed   By: Donnal Fusi M.D.   On: 12/27/2023 08:38   CT ABDOMEN PELVIS WO CONTRAST Result Date: 12/26/2023 CLINICAL DATA:  nvd, abd pain EXAM: CT ABDOMEN AND PELVIS WITHOUT CONTRAST TECHNIQUE: Multidetector CT imaging of the abdomen and pelvis was performed following the standard protocol without IV contrast. Of note, the lack of intravenous contrast limits evaluation of the solid organ parenchyma and vascularity. RADIATION DOSE REDUCTION: This exam was performed according to the departmental dose-optimization program which includes automated exposure control, adjustment of the mA and/or kV according to patient size and/or use of iterative reconstruction technique. COMPARISON:  March 30, 2021, June 19, 2022 FINDINGS: Lower chest: No focal airspace consolidation or pleural effusion.Elevation of the right hemidiaphragm with subsegmental and posterior right basilar dependent atelectasis. Hepatobiliary: Severe diffuse hepatic steatosis. No mass.The gallbladder is distended and diffusely fluid-filled. Multiple small radiopaque stones. Subtle inflammation within the porta hepatis and gallbladder neck/downstream body.No intrahepatic or extrahepatic biliary ductal dilation. No obstructive, radiopaque choledocholithiasis. Pancreas: Mild parenchymal atrophy. No mass. Subtle fatty induration about the pancreatic head and uncinate  process.No well-formed or drainable peripancreatic fluid collection. Spleen: Normal size. No mass. Adrenals/Urinary Tract: No adrenal masses. No mass. A few small cysts noted in both kidneys. No hydronephrosis or nephrolithiasis. Mild circumferential wall thickening of the urinary bladder. Stomach/Bowel:Small sliding-type hiatal hernia. The stomach contains ingested material without focal abnormality. No small bowel wall thickening or inflammation. No small bowel obstruction. Normal appendix. Vascular/Lymphatic: No aortic aneurysm. Diffuse aortoiliac atherosclerosis. No intraabdominal or pelvic lymphadenopathy. Reproductive: Moderate prostatomegaly. Likely fiducial markers within the prostate gland.No free pelvic fluid. Other: No pneumoperitoneum, ascites, or mesenteric inflammation. Musculoskeletal: No acute fracture or destructive lesion.Small volume symmetric bilateral gynecomastia. Multilevel degenerative disc disease of the spine. Mild grade 1 anterolisthesis of L4 on L5. IMPRESSION: 1. Fluid-filled, distended gallbladder with multiple radiopaque stones. There is induration of the pericholecystic fat around the gallbladder neck and downstream body with similar inflammation surrounding the pancreatic head and uncinate process. This could represent changes of early acute cholecystitis or acute interstitial edematous pancreatitis. Alternatively, these changes could also reflect findings of acute hepatitis. Correlation with serum lipase and liver enzymes recommended. 2. Circumferential wall thickening of the urinary bladder, which may be due to underdistension, chronic bladder outlet obstruction, or acute cystitis. Correlation with urinalysis is recommended. 3. Moderate prostatomegaly. 4. Small sliding-type hiatal hernia. Electronically Signed   By: Rance Burrows M.D.   On: 12/26/2023 14:11   US  Abdomen Limited RUQ (LIVER/GB) Result Date: 12/26/2023 CLINICAL DATA:  Elevated liver function tests EXAM:  ULTRASOUND ABDOMEN LIMITED RIGHT UPPER QUADRANT COMPARISON:  CT abdomen pelvis 06/19/2022 FINDINGS: Gallbladder: Gallstones: Present Sludge: Present Gallbladder Wall: Within normal limits Pericholecystic fluid: None Sonographic Murphy's Sign: Negative per technologist Common bile duct: Diameter: 10 mm Liver: Parenchymal echogenicity: Diffusely increased Contours: Normal Lesions: None Portal vein: Patent.  Hepatopetal flow Other: None. IMPRESSION: 1. Cholelithiasis without additional sonographic evidence of acute cholecystitis. 2. Dilated common bile duct measuring up to 10 mm. If the patient's liver function tests are suggestive of biliary obstruction, further evaluation with MRI/MRCP should be performed. 3. Diffuse increased echogenicity of the hepatic parenchyma is a nonspecific indicator of hepatocellular dysfunction, most commonly steatosis. Electronically Signed   By: Elester Grim M.D.   On: 12/26/2023 12:19   DG Chest Port 1 View Result Date: 12/26/2023 CLINICAL DATA:  Possible sepsis. EXAM: PORTABLE CHEST 1 VIEW COMPARISON:  07/25/2014 FINDINGS: Normal sized heart. Mildly tortuous aorta. Stable large right diaphragmatic eventration with minimal right basilar atelectasis/scarring. Clear left lung. Unremarkable bones. IMPRESSION: 1. No acute abnormality. 2. Stable large right diaphragmatic eventration with minimal right basilar atelectasis/scarring. Electronically Signed   By: Catherin Closs M.D.   On: 12/26/2023 10:39   CT HEAD WO CONTRAST Result Date: 12/26/2023 CLINICAL DATA:  Tremors EXAM: CT HEAD WITHOUT CONTRAST TECHNIQUE: Contiguous axial images were obtained from the base of the skull through the vertex without intravenous contrast. RADIATION DOSE REDUCTION: This exam was performed according to the departmental dose-optimization program which includes automated exposure control, adjustment of the mA and/or kV according to patient size and/or use of iterative reconstruction technique.  COMPARISON:   Brain MRI 09/12/2017 FINDINGS: Brain: No evidence of acute infarction, hemorrhage, hydrocephalus, extra-axial collection or mass lesion/mass effect. Generalized brain atrophy similar to prior. Mild chronic small vessel ischemia in the deep white matter. Vascular: No hyperdense vessel or unexpected calcification. Skull: Normal. Negative for fracture or focal lesion. Sinuses/Orbits: No acute finding. IMPRESSION: Aging brain without acute or reversible finding. No convincing change since a 2018 MRI. Electronically Signed   By: Ronnette Coke M.D.   On: 12/26/2023 10:01    Anti-infectives: Anti-infectives (From admission, onward)    Start     Dose/Rate Route Frequency Ordered Stop   12/27/23 1100  ceFEPIme (MAXIPIME) 2 g in sodium chloride 0.9 % 100 mL IVPB        2 g 200 mL/hr over 30 Minutes Intravenous Every 24 hours 12/26/23 1704     12/27/23 0100  metroNIDAZOLE (FLAGYL) IVPB 500 mg        500 mg 100 mL/hr over 60 Minutes Intravenous Every 12 hours 12/26/23 1703     12/26/23 2200  metroNIDAZOLE (FLAGYL) IVPB 500 mg  Status:  Discontinued        500 mg 100 mL/hr over 60 Minutes Intravenous Every 12 hours 12/26/23 1654 12/26/23 1703   12/26/23 1704  vancomycin variable dose per unstable renal function (pharmacist dosing)         Does not apply See admin instructions 12/26/23 1704     12/26/23 1015  ceFEPIme (MAXIPIME) 2 g in sodium chloride 0.9 % 100 mL IVPB        2 g 200 mL/hr over 30 Minutes Intravenous  Once 12/26/23 1001 12/26/23 1300   12/26/23 1015  metroNIDAZOLE (FLAGYL) IVPB 500 mg        500 mg 100 mL/hr over 60 Minutes Intravenous  Once 12/26/23 1001 12/26/23 1501   12/26/23 1015  vancomycin (VANCOCIN) IVPB 1000 mg/200 mL premix        1,000 mg 200 mL/hr over 60 Minutes Intravenous  Once 12/26/23 1001 12/26/23 1618        Assessment/Plan Cholelithiasis  Elevated LFT's -  RUQ U/S shows cholelithiasis without features of cholecystitis, with a dilated common bile duct (10  mm) - CT abdomen/pelvis W/O contrast shows a distended gallbladder with cholelithiasis and surrounding inflammatory changes, including edema of the pancreatitic head. Lipase 76 - Afebrile. Lactic acidosis improving. Last WBC wnl.  - LFT's elevated but downtrending AST 1866 (1,997), ALT 1076 (1,128), alk phos 62 (84), t.bili 2.5 (3.4) ( LFT's normal 3 years ago) - Hepatitis panel neg - Agree with abx. Cont - No indication for emergency surgery at this time. His US  showed no evidence of Cholecystitis but there is some inflammatory changes on CT that is unclear if 2/2 pancreatitis. There is a HIDA scan pending that has been ordered by GI. Would continue abx, resuscitation and await final GI recs given LFT's elevation of unclear etiology (in the setting of gallstones + dilated cbd on imaging, etoh use, and sepsis on presentation) to see if pt needs MRCP/ERCP to r/o choledocholithiasis/cholangitis. Continue to trend LFT's. Downtrending today. If pts clinical picture is not improving or HIDA is positive for acute cholecystitis would recommend perc chole drain.    FEN - NPO till final GI recs.  VTE - SCDs, okay for chem ppx from a general surgery standpoint. If felt to need therapeutic anticoagulation would favor heparin gtt or therapeutic lovenox  ID - Cefepime/Flagyl/Vanc.  Foley - In place Dispo - Admit to TRH/CCM.  Presented w/ shock with signs of acute kidney and liver dysfunction  Lactic acidosis - improving A.fib with RVR - on amio gtt AKI on CKD - cr improving Reported black stools - fecal occult blood negative  EtOH abuse  HTN HLD OSA on CPAP prediabetes PMH Prostate CA s/p radiation therapy, Dr. Jodeane Mulligan. Lorri Rota Headaches GERD  I reviewed nursing notes, Consultant GI notes, hospitalist notes, last 24 h vitals and pain scores, last 48 h intake and output, last 24 h labs and trends, and last 24 h imaging results.   LOS: 1 day    Delton Filbert, [facility]  Surgery 12/27/2023, 8:43 AM Please see Amion for pager number during day hours 7:00am-4:30pm

## 2023-12-27 NOTE — Progress Notes (Signed)
 Nuclear med tech on call aware of STAT HIDA.  Joesph Mussel, DO 12/27/23 10:02 AM [facility]  For contact information, see Amion. If no response to pager, please call PCCM consult pager. After hours, 7PM- 7AM, please call Elink.

## 2023-12-28 ENCOUNTER — Inpatient Hospital Stay (HOSPITAL_COMMUNITY): Admitting: Certified Registered"

## 2023-12-28 ENCOUNTER — Other Ambulatory Visit: Payer: Self-pay

## 2023-12-28 ENCOUNTER — Inpatient Hospital Stay (HOSPITAL_COMMUNITY)

## 2023-12-28 ENCOUNTER — Encounter (HOSPITAL_COMMUNITY)
Admission: EM | Disposition: A | Discharge status: Skilled Nursing Facility | Payer: Self-pay | Source: Home / Self Care | Attending: Internal Medicine

## 2023-12-28 ENCOUNTER — Ambulatory Visit: Payer: Self-pay | Admitting: General Surgery

## 2023-12-28 ENCOUNTER — Encounter (HOSPITAL_COMMUNITY): Payer: Self-pay | Admitting: Pulmonary Disease

## 2023-12-28 DIAGNOSIS — R6521 Severe sepsis with septic shock: Secondary | ICD-10-CM | POA: Diagnosis not present

## 2023-12-28 DIAGNOSIS — G4733 Obstructive sleep apnea (adult) (pediatric): Secondary | ICD-10-CM

## 2023-12-28 DIAGNOSIS — I10 Essential (primary) hypertension: Secondary | ICD-10-CM

## 2023-12-28 DIAGNOSIS — D689 Coagulation defect, unspecified: Secondary | ICD-10-CM

## 2023-12-28 DIAGNOSIS — R7989 Other specified abnormal findings of blood chemistry: Secondary | ICD-10-CM | POA: Diagnosis not present

## 2023-12-28 DIAGNOSIS — R1011 Right upper quadrant pain: Secondary | ICD-10-CM | POA: Diagnosis not present

## 2023-12-28 DIAGNOSIS — K810 Acute cholecystitis: Secondary | ICD-10-CM | POA: Diagnosis not present

## 2023-12-28 DIAGNOSIS — A419 Sepsis, unspecified organism: Secondary | ICD-10-CM | POA: Diagnosis not present

## 2023-12-28 DIAGNOSIS — I4891 Unspecified atrial fibrillation: Secondary | ICD-10-CM | POA: Diagnosis not present

## 2023-12-28 DIAGNOSIS — K7589 Other specified inflammatory liver diseases: Secondary | ICD-10-CM

## 2023-12-28 HISTORY — PX: CHOLECYSTECTOMY: SHX55

## 2023-12-28 LAB — PROTIME-INR
INR: 1.4 — ABNORMAL HIGH (ref 0.8–1.2)
Prothrombin Time: 17 s — ABNORMAL HIGH (ref 11.4–15.2)

## 2023-12-28 LAB — HEMOGLOBIN A1C
Hgb A1c MFr Bld: 5.6 % (ref 4.8–5.6)
Mean Plasma Glucose: 114.02 mg/dL

## 2023-12-28 LAB — COMPREHENSIVE METABOLIC PANEL WITH GFR
ALT: 1258 U/L — ABNORMAL HIGH (ref 0–44)
AST: 1528 U/L — ABNORMAL HIGH (ref 15–41)
Albumin: 2.2 g/dL — ABNORMAL LOW (ref 3.5–5.0)
Alkaline Phosphatase: 82 U/L (ref 38–126)
Anion gap: 16 — ABNORMAL HIGH (ref 5–15)
BUN: 54 mg/dL — ABNORMAL HIGH (ref 8–23)
CO2: 25 mmol/L (ref 22–32)
Calcium: 7.4 mg/dL — ABNORMAL LOW (ref 8.9–10.3)
Chloride: 96 mmol/L — ABNORMAL LOW (ref 98–111)
Creatinine, Ser: 2.77 mg/dL — ABNORMAL HIGH (ref 0.61–1.24)
GFR, Estimated: 21 mL/min — ABNORMAL LOW (ref >60)
Glucose, Bld: 95 mg/dL (ref 70–99)
Potassium: 2.9 mmol/L — ABNORMAL LOW (ref 3.5–5.1)
Sodium: 137 mmol/L (ref 135–145)
Total Bilirubin: 2.4 mg/dL — ABNORMAL HIGH (ref 0.0–1.2)
Total Protein: 4.9 g/dL — ABNORMAL LOW (ref 6.5–8.1)

## 2023-12-28 LAB — CBC
HCT: 29.1 % — ABNORMAL LOW (ref 39.0–52.0)
Hemoglobin: 9.8 g/dL — ABNORMAL LOW (ref 13.0–17.0)
MCH: 30.7 pg (ref 26.0–34.0)
MCHC: 33.7 g/dL (ref 30.0–36.0)
MCV: 91.2 fL (ref 80.0–100.0)
Platelets: 151 10*3/uL (ref 150–400)
RBC: 3.19 MIL/uL — ABNORMAL LOW (ref 4.22–5.81)
RDW: 18.2 % — ABNORMAL HIGH (ref 11.5–15.5)
WBC: 4.7 10*3/uL (ref 4.0–10.5)
nRBC: 0 % (ref 0.0–0.2)

## 2023-12-28 SURGERY — LAPAROSCOPIC CHOLECYSTECTOMY WITH INTRAOPERATIVE CHOLANGIOGRAM
Anesthesia: General

## 2023-12-28 MED ORDER — CHLORHEXIDINE GLUCONATE 0.12 % MT SOLN: 15.0000 mL | Freq: Once | OROMUCOSAL | Status: AC

## 2023-12-28 MED ORDER — CALCIUM CHLORIDE 10 % IV SOLN
INTRAVENOUS | Status: DC | PRN
  Administered 2023-12-28 (×5): 200 mg via INTRAVENOUS

## 2023-12-28 MED ORDER — ROCURONIUM BROMIDE 10 MG/ML (PF) SYRINGE
PREFILLED_SYRINGE | INTRAVENOUS | Status: DC | PRN
  Administered 2023-12-28: 10 mg via INTRAVENOUS
  Administered 2023-12-28: 50 mg via INTRAVENOUS

## 2023-12-28 MED ORDER — POTASSIUM CHLORIDE 20 MEQ PO PACK
60.0000 meq | PACK | Freq: Once | ORAL | Status: AC
  Administered 2023-12-28: 60 meq via ORAL
  Filled 2023-12-28: qty 3

## 2023-12-28 MED ORDER — PROPOFOL 10 MG/ML IV BOLUS
INTRAVENOUS | Status: AC
  Filled 2023-12-28: qty 20

## 2023-12-28 MED ORDER — CHLORHEXIDINE GLUCONATE 0.12 % MT SOLN
OROMUCOSAL | Status: AC
Start: 2023-12-28 — End: 2023-12-28
  Administered 2023-12-28: 15 mL via OROMUCOSAL
  Filled 2023-12-28: qty 15

## 2023-12-28 MED ORDER — METHOCARBAMOL 500 MG PO TABS
500.0000 mg | ORAL_TABLET | Freq: Four times a day (QID) | ORAL | Status: DC | PRN
  Administered 2023-12-29 (×2): 500 mg via ORAL
  Filled 2023-12-28 (×2): qty 1

## 2023-12-28 MED ORDER — 0.9 % SODIUM CHLORIDE (POUR BTL) OPTIME
TOPICAL | Status: DC | PRN
  Administered 2023-12-28: 1000 mL

## 2023-12-28 MED ORDER — LIDOCAINE 2% (20 MG/ML) 5 ML SYRINGE
INTRAMUSCULAR | Status: AC
  Filled 2023-12-28: qty 5

## 2023-12-28 MED ORDER — PROPOFOL 10 MG/ML IV BOLUS
INTRAVENOUS | Status: DC | PRN
  Administered 2023-12-28: 100 mg via INTRAVENOUS

## 2023-12-28 MED ORDER — SUCCINYLCHOLINE CHLORIDE 200 MG/10ML IV SOSY
PREFILLED_SYRINGE | INTRAVENOUS | Status: DC | PRN
  Administered 2023-12-28: 100 mg via INTRAVENOUS

## 2023-12-28 MED ORDER — DEXAMETHASONE SODIUM PHOSPHATE 10 MG/ML IJ SOLN
INTRAMUSCULAR | Status: DC | PRN
  Administered 2023-12-28: 5 mg via INTRAVENOUS

## 2023-12-28 MED ORDER — ALBUMIN HUMAN 5 % IV SOLN: INTRAVENOUS | Status: DC | PRN

## 2023-12-28 MED ORDER — SODIUM CHLORIDE 0.9 % IV SOLN: INTRAVENOUS | Status: DC

## 2023-12-28 MED ORDER — BUPIVACAINE-EPINEPHRINE (PF) 0.25% -1:200000 IJ SOLN
INTRAMUSCULAR | Status: DC | PRN
  Administered 2023-12-28: 30 mL

## 2023-12-28 MED ORDER — AMIODARONE HCL 200 MG PO TABS
200.0000 mg | ORAL_TABLET | Freq: Two times a day (BID) | ORAL | Status: DC
  Administered 2023-12-28 – 2023-12-31 (×6): 200 mg via ORAL
  Filled 2023-12-28 (×6): qty 1

## 2023-12-28 MED ORDER — CHLORHEXIDINE GLUCONATE CLOTH 2 % EX PADS
6.0000 | MEDICATED_PAD | Freq: Once | CUTANEOUS | Status: AC
  Administered 2023-12-28: 6 via TOPICAL

## 2023-12-28 MED ORDER — SODIUM CHLORIDE 0.9 % IV SOLN: INTRAVENOUS | Status: DC | PRN

## 2023-12-28 MED ORDER — PHENYLEPHRINE HCL-NACL 20-0.9 MG/250ML-% IV SOLN
INTRAVENOUS | Status: DC | PRN
  Administered 2023-12-28: 40 ug/min via INTRAVENOUS

## 2023-12-28 MED ORDER — DEXAMETHASONE SODIUM PHOSPHATE 10 MG/ML IJ SOLN
INTRAMUSCULAR | Status: AC
  Filled 2023-12-28: qty 1

## 2023-12-28 MED ORDER — IOHEXOL 300 MG/ML  SOLN
INTRAMUSCULAR | Status: DC | PRN
  Administered 2023-12-28: 20 mL

## 2023-12-28 MED ORDER — FENTANYL CITRATE (PF) 250 MCG/5ML IJ SOLN
INTRAMUSCULAR | Status: DC | PRN
  Administered 2023-12-28: 50 ug via INTRAVENOUS

## 2023-12-28 MED ORDER — OXYCODONE HCL 5 MG PO TABS
2.5000 mg | ORAL_TABLET | ORAL | Status: DC | PRN
  Administered 2023-12-28: 5 mg via ORAL
  Filled 2023-12-28: qty 1

## 2023-12-28 MED ORDER — BUPIVACAINE-EPINEPHRINE (PF) 0.25% -1:200000 IJ SOLN
INTRAMUSCULAR | Status: AC
  Filled 2023-12-28: qty 30

## 2023-12-28 MED ORDER — ROCURONIUM BROMIDE 10 MG/ML (PF) SYRINGE
PREFILLED_SYRINGE | INTRAVENOUS | Status: AC
  Filled 2023-12-28: qty 10

## 2023-12-28 MED ORDER — ONDANSETRON HCL 4 MG/2ML IJ SOLN
INTRAMUSCULAR | Status: AC
  Filled 2023-12-28: qty 2

## 2023-12-28 MED ORDER — ACETAMINOPHEN 500 MG PO TABS
1000.0000 mg | ORAL_TABLET | Freq: Four times a day (QID) | ORAL | Status: DC
  Administered 2023-12-28 – 2023-12-31 (×12): 1000 mg via ORAL
  Filled 2023-12-28 (×13): qty 2

## 2023-12-28 MED ORDER — ONDANSETRON HCL 4 MG/2ML IJ SOLN
INTRAMUSCULAR | Status: DC | PRN
  Administered 2023-12-28: 4 mg via INTRAVENOUS

## 2023-12-28 MED ORDER — LIDOCAINE 2% (20 MG/ML) 5 ML SYRINGE
INTRAMUSCULAR | Status: DC | PRN
  Administered 2023-12-28: 60 mg via INTRAVENOUS

## 2023-12-28 MED ORDER — SUGAMMADEX SODIUM 200 MG/2ML IV SOLN
INTRAVENOUS | Status: DC | PRN
  Administered 2023-12-28: 142 mg via INTRAVENOUS

## 2023-12-28 MED ORDER — HEMOSTATIC AGENTS (NO CHARGE) OPTIME
TOPICAL | Status: DC | PRN
Start: 2023-12-28 — End: 2023-12-28
  Administered 2023-12-28: 1 via TOPICAL

## 2023-12-28 MED ORDER — FENTANYL CITRATE (PF) 250 MCG/5ML IJ SOLN
INTRAMUSCULAR | Status: AC
Start: 2023-12-28 — End: ?
  Filled 2023-12-28: qty 5

## 2023-12-28 MED ORDER — SODIUM CHLORIDE 0.9 % IR SOLN
Status: DC | PRN
  Administered 2023-12-28 (×2): 1000 mL

## 2023-12-28 MED ORDER — SUCCINYLCHOLINE CHLORIDE 200 MG/10ML IV SOSY
PREFILLED_SYRINGE | INTRAVENOUS | Status: AC
  Filled 2023-12-28: qty 10

## 2023-12-28 MED ORDER — AMIODARONE HCL 200 MG PO TABS: 200.0000 mg | ORAL_TABLET | Freq: Two times a day (BID) | ORAL | Status: DC

## 2023-12-28 MED ORDER — ORAL CARE MOUTH RINSE: 15.0000 mL | Freq: Once | OROMUCOSAL | Status: AC

## 2023-12-28 SURGICAL SUPPLY — 42 items
APPLIER CLIP ROT 10 11.4 M/L (STAPLE) ×1 IMPLANT
BAG COUNTER SPONGE SURGICOUNT (BAG) ×1 IMPLANT
CANISTER SUCT 3000ML PPV (MISCELLANEOUS) ×1 IMPLANT
CHLORAPREP W/TINT 26 (MISCELLANEOUS) ×1 IMPLANT
CLIP APPLIE ROT 10 11.4 M/L (STAPLE) ×1 IMPLANT
COVER MAYO STAND STRL (DRAPES) IMPLANT
COVER SURGICAL LIGHT HANDLE (MISCELLANEOUS) ×1 IMPLANT
DERMABOND ADVANCED .7 DNX12 (GAUZE/BANDAGES/DRESSINGS) ×1 IMPLANT
DRAPE C-ARM 42X72 X-RAY (DRAPES) IMPLANT
ELECT REM PT RETURN 9FT ADLT (ELECTROSURGICAL) ×1 IMPLANT
ELECTRODE REM PT RTRN 9FT ADLT (ELECTROSURGICAL) ×1 IMPLANT
ENDOLOOP SUT PDS II 0 18 (SUTURE) IMPLANT
GLOVE BIO SURGEON STRL SZ7 (GLOVE) ×1 IMPLANT
GOWN STRL REUS W/ TWL LRG LVL3 (GOWN DISPOSABLE) ×2 IMPLANT
GOWN STRL REUS W/ TWL XL LVL3 (GOWN DISPOSABLE) ×1 IMPLANT
GRASPER SUT TROCAR 14GX15 (MISCELLANEOUS) ×1 IMPLANT
HEMOSTAT SNOW SURGICEL 2X4 (HEMOSTASIS) IMPLANT
IRRIG SUCT STRYKERFLOW 2 WTIP (MISCELLANEOUS) ×1 IMPLANT
IRRIGATION SUCT STRKRFLW 2 WTP (MISCELLANEOUS) ×1 IMPLANT
KIT BASIN OR (CUSTOM PROCEDURE TRAY) ×1 IMPLANT
KIT TURNOVER KIT B (KITS) ×1 IMPLANT
L-HOOK LAP DISP 36CM (ELECTROSURGICAL) ×1 IMPLANT
LHOOK LAP DISP 36CM (ELECTROSURGICAL) ×1 IMPLANT
NDL 22X1.5 STRL (OR ONLY) (MISCELLANEOUS) ×1 IMPLANT
NDL INSUFFLATION 14GA 120MM (NEEDLE) ×1 IMPLANT
NEEDLE 22X1.5 STRL (OR ONLY) (MISCELLANEOUS) ×1 IMPLANT
NEEDLE INSUFFLATION 14GA 120MM (NEEDLE) ×1 IMPLANT
NS IRRIG 1000ML POUR BTL (IV SOLUTION) ×1 IMPLANT
PAD ARMBOARD POSITIONER FOAM (MISCELLANEOUS) ×1 IMPLANT
PENCIL BUTTON HOLSTER BLD 10FT (ELECTRODE) ×1 IMPLANT
POUCH RETRIEVAL ECOSAC 10 (ENDOMECHANICALS) ×1 IMPLANT
SCISSORS LAP 5X35 DISP (ENDOMECHANICALS) ×1 IMPLANT
SET CHOLANGIOGRAPH 5 50 .035 (SET/KITS/TRAYS/PACK) IMPLANT
SET TUBE SMOKE EVAC HIGH FLOW (TUBING) ×1 IMPLANT
SLEEVE Z-THREAD 5X100MM (TROCAR) ×2 IMPLANT
SUT MNCRL AB 4-0 PS2 18 (SUTURE) ×1 IMPLANT
TOWEL GREEN STERILE (TOWEL DISPOSABLE) ×1 IMPLANT
TRAY LAPAROSCOPIC MC (CUSTOM PROCEDURE TRAY) ×1 IMPLANT
TROCAR Z THREAD OPTICAL 12X100 (TROCAR) ×1 IMPLANT
TROCAR Z-THREAD OPTICAL 5X100M (TROCAR) ×1 IMPLANT
WARMER LAPAROSCOPE (MISCELLANEOUS) ×1 IMPLANT
WATER STERILE IRR 1000ML POUR (IV SOLUTION) ×1 IMPLANT

## 2023-12-28 NOTE — Progress Notes (Addendum)
 [facility] Progress Note  Chief Complaint: Acute cholecystitis, elevated LFTs  History:  HIDA scan got done late yesterday, report shows findings consistent with acute cholecystitis.  Communicated with critical care last evening, they involve interventional radiology with initial plans for a percutaneous cholecystostomy this morning.  Surgical service has ultimately decided to take him to the OR this morning. My PA and I saw this patient in the room as he was being prepped to bring to the preop area, one of his daughters is at the bedside on the phone with another of her siblings. Jorge Martin is still stuporous and cannot give any helpful history or review of systems.    Objective:   Current Facility-Administered Medications:    [MAR Hold] acetaminophen (TYLENOL) suppository 650 mg, 650 mg, Rectal, Q4H PRN, Ogan, Okoronkwo U, MD   [COMPLETED] amiodarone (NEXTERONE) 1.8 mg/mL load via infusion 150 mg, 150 mg, Intravenous, Once, 150 mg at 12/26/23 1546 **FOLLOWED BY** [EXPIRED] amiodarone (NEXTERONE PREMIX) 360-4.14 MG/200ML-% (1.8 mg/mL) IV infusion, 60 mg/hr, Intravenous, Continuous, Stopped at 12/26/23 2239 **FOLLOWED BY** amiodarone (NEXTERONE PREMIX) 360-4.14 MG/200ML-% (1.8 mg/mL) IV infusion, 30 mg/hr, Intravenous, Continuous, Gleason, Patt Boozer, PA-C, Last Rate: 16.67 mL/hr at 12/28/23 0600, 30 mg/hr at 12/28/23 0600   [MAR Hold] Chlorhexidine Gluconate Cloth 2 % PADS 6 each, 6 each, Topical, Daily, Mannam, Praveen, MD, 6 each at 12/27/23 1059   6 CHG cloth bath night before surgery, , , Once **AND** [START ON 12/29/2023] 6 CHG cloth bath AM of surgery, , , Once **AND** Chlorhexidine Gluconate Cloth 2 % PADS 6 each, 6 each, Topical, Once **AND** Chlorhexidine Gluconate Cloth 2 % PADS 6 each, 6 each, Topical, Once, Davonna Estes, Willeen Harold, MD   [MAR Hold] docusate sodium (COLACE) capsule 100 mg, 100 mg, Oral, BID PRN, Gleason, Laura R, PA-C   [MAR Hold] donepezil (ARICEPT) tablet 10 mg, 10 mg,  Oral, QHS, Gleason, Laura R, PA-C, 10 mg at 12/27/23 2103   [facility] Hold] multivitamin with minerals tablet 1 tablet, 1 tablet, Oral, Daily, Gleason, Patt Boozer, PA-C, 1 tablet at 12/27/23 1752   [MAR Hold] Oral care mouth rinse, 15 mL, Mouth Rinse, PRN, Jaquita Merl P, DO   [EXPIRED] pantoprazole (PROTONIX) injection 40 mg, 40 mg, Intravenous, Q5 min, 40 mg at 12/26/23 1133 **FOLLOWED BY** [MAR Hold] pantoprazole (PROTONIX) injection 40 mg, 40 mg, Intravenous, Q12H, Ninetta Basket, MD, 40 mg at 12/27/23 2103   [facility] Hold] piperacillin-tazobactam (ZOSYN) IVPB 2.25 g, 2.25 g, Intravenous, Q8H, Joesph Mussel, DO, Stopped at 12/28/23 0537   [MAR Hold] polyethylene glycol (MIRALAX / GLYCOLAX) packet 17 g, 17 g, Oral, Daily PRN, Gleason, Patt Boozer, PA-C   sodium bicarbonate 150 mEq in sterile water 1,150 mL infusion, , Intravenous, Continuous, Joesph Mussel, DO, Last Rate: 100 mL/hr at 12/28/23 0600, Infusion Verify at 12/28/23 0600   [MAR Hold] thiamine (VITAMIN B1) tablet 100 mg, 100 mg, Oral, Daily, Gleason, Laura R, PA-C, 100 mg at 12/27/23 1752   amiodarone 30 mg/hr (12/28/23 0600)   [MAR Hold] piperacillin-tazobactam (ZOSYN)  IV Stopped (12/28/23 0537)   sodium bicarbonate 150 mEq in sterile water 1,150 mL infusion 100 mL/hr at 12/28/23 0600     Vital signs in last 24 hrs: Vitals:   12/28/23 0600 12/28/23 0737  BP: (!) 106/58   Pulse: (!) 57   Resp: 14   Temp:  98.3 F (36.8 C)  SpO2: 94%     Intake/Output Summary (Last 24 hours) at 12/28/2023 0856 Last  data filed at 12/28/2023 0600 Gross per 24 hour  Intake 2058.21 ml  Output 720 ml  Net 1338.21 ml     Physical Exam Altered mental status as noted above Cardiac: RRR without murmurs, S1S2 heard, no peripheral edema Pulm: clear to auscultation bilaterally, normal RR and effort noted Abdomen: soft, he grimaces with pressure on the right upper quadrant  Skin; warm and dry, no jaundice  Recent Labs:     Latest Ref Rng & Units  12/28/2023    4:14 AM 12/26/2023    9:23 AM 12/26/2023    9:00 AM  CBC  WBC 4.0 - 10.5 K/uL 4.7   8.6   Hemoglobin 13.0 - 17.0 g/dL 9.8  16.1  09.6   Hematocrit 39.0 - 52.0 % 29.1  43.0  43.2   Platelets 150 - 400 K/uL 151   189     Recent Labs  Lab 12/28/23 0414  INR 1.4*   INR was 1.4 yesterday morning as well, down from 1.7 on admission     Latest Ref Rng & Units 12/28/2023    4:14 AM 12/27/2023   11:53 AM 12/26/2023    4:42 PM  CMP  Glucose 70 - 99 mg/dL 95  045  409   BUN 8 - 23 mg/dL 54  49  36   Creatinine 0.61 - 1.24 mg/dL 8.11  9.14  7.82   Sodium 135 - 145 mmol/L 137  139  139   Potassium 3.5 - 5.1 mmol/L 2.9  3.6  3.9   Chloride 98 - 111 mmol/L 96  102  105   CO2 22 - 32 mmol/L 25  14  14    Calcium 8.9 - 10.3 mg/dL 7.4  8.2  8.3   Total Protein 6.5 - 8.1 g/dL 4.9  5.5  5.4   Total Bilirubin 0.0 - 1.2 mg/dL 2.4  2.2  2.5   Alkaline Phos 38 - 126 U/L 82  70  62   AST 15 - 41 U/L 1,528  2,319  1,866   ALT 0 - 44 U/L 1,258  1,525  1,076      Radiologic studies:   Assessment & Plan  Assessment: Acute cholecystitis leading to sepsis, lactic acidosis, driving up his A-fib and RVR, both of those issues causing hypotension and ultimately ischemic hepatopathy.  Mild coagulopathy on admission, improving and stable last 24 hours.  He converted out of A-fib on amiodarone, remains critically sick. Transaminases decreasing, bilirubin slight rise (expected in this clinical scenario) Zosyn on board Plan: OR today Daily CMP, CBC, INR Dr. Nickey Barn to return to service tomorrow will follow this patient regarding his ischemic hepatopathy (and in case of eventual need for ERCP if any bile leak should this be a complex or subtotal cholecystectomy.  High degree medical decision making  Kerby Pearson III Office: [phone]

## 2023-12-28 NOTE — Plan of Care (Signed)

## 2023-12-28 NOTE — Anesthesia Preprocedure Evaluation (Addendum)
 Anesthesia Evaluation  Patient identified by MRN, date of birth, ID band Patient awake    Reviewed: Allergy & Precautions, H&P , NPO status , Patient's Chart, lab work & pertinent test results  Airway Mallampati: II  TM Distance: >3 FB Neck ROM: Full    Dental no notable dental hx. (+) Dental Advisory Given, Missing, Chipped   Pulmonary sleep apnea and Continuous Positive Airway Pressure Ventilation    Pulmonary exam normal breath sounds clear to auscultation       Cardiovascular hypertension, Pt. on medications  Rhythm:Regular Rate:Normal     Neuro/Psych  Headaches  negative psych ROS   GI/Hepatic Neg liver ROS,GERD  Medicated,,  Endo/Other  negative endocrine ROS    Renal/GU Renal InsufficiencyRenal disease  negative genitourinary   Musculoskeletal  (+) Arthritis , Osteoarthritis,    Abdominal   Peds  Hematology negative hematology ROS (+)   Anesthesia Other Findings   Reproductive/Obstetrics negative OB ROS                             Anesthesia Physical Anesthesia Plan  ASA: 3  Anesthesia Plan: General   Post-op Pain Management:    Induction: Intravenous  PONV Risk Score and Plan: 3 and Ondansetron, Dexamethasone and Treatment may vary due to age or medical condition  Airway Management Planned: Oral ETT  Additional Equipment:   Intra-op Plan:   Post-operative Plan: Extubation in OR  Informed Consent: I have reviewed the patients History and Physical, chart, labs and discussed the procedure including the risks, benefits and alternatives for the proposed anesthesia with the patient or authorized representative who has indicated his/her understanding and acceptance.   Patient has DNR.  Discussed DNR with power of attorney and Suspend DNR.   Dental advisory given  Plan Discussed with: CRNA  Anesthesia Plan Comments:        Anesthesia Quick Evaluation

## 2023-12-28 NOTE — Progress Notes (Signed)
 eLink Physician-Brief Progress Note Patient Name: Jorge Martin DOB: 09-18-1934 MRN: [id number]   Date of Service  12/28/2023  HPI/Events of Note  89 y.o. M with PMH of HTN and early Alzheimer's, HTN, OSA, Prostate Ca, CKD 3b  who has a history of ETOH abuse.  AM potassium 2.9  eICU Interventions  KCl ordered     Intervention Category Minor Interventions: Electrolytes abnormality - evaluation and management  Jorge Martin 12/28/2023, 6:15 AM

## 2023-12-28 NOTE — Transfer of Care (Signed)
 Immediate Anesthesia Transfer of Care Note  Patient: Jorge Martin  Procedure(s) Performed: LAPAROSCOPIC CHOLECYSTECTOMY WITH INTRAOPERATIVE CHOLANGIOGRAM  Patient Location: PACU  Anesthesia Type:General  Level of Consciousness: drowsy  Airway & Oxygen Therapy: Patient Spontanous Breathing and Patient connected to face mask oxygen  Post-op Assessment: Report given to RN and Post -op Vital signs reviewed and stable  Post vital signs: Reviewed and stable  Last Vitals:  Vitals Value Taken Time  BP 111/91 12/28/23 1146  Temp    Pulse 68 12/28/23 1150  Resp 25 12/28/23 1150  SpO2 98 % 12/28/23 1150  Vitals shown include unfiled device data.  Last Pain:  Vitals:   12/28/23 0915  TempSrc: Oral  PainSc: 0-No pain         Complications: No notable events documented.

## 2023-12-28 NOTE — Progress Notes (Signed)
 NAME:  Jorge Martin, MRN:  [id number], DOB:  01-Feb-1935, LOS: 2 ADMISSION DATE:  12/26/2023, CONSULTATION DATE:  12/28/23 REFERRING MD:  Jorge Martin, CHIEF COMPLAINT:  weakness    History of Present Illness:  Jorge Martin is a 89 y.o. M with PMH of HTN and early Alzheimer's, HTN, OSA, Prostate Ca, CKD 3b  who has a history of ETOH abuse.  His daughter's are at the bedside and report that he had quit drinking for some time and then picked it up again a couple of months ago.  He quit again cold [place] sometime in the last 7-9 days.  He has not felt like eating much in that time, he had some tremors but no confusion (at baseline he just has mild memory loss and takes care of himself).  He denies fevers, chills or abdominal pain.  He was so weak that his family brought him to the ED this morning.  Since arrival he has vomited several times in the ED and converted to atrial fibrillation with RVR.  Work-up was significant for elevated lactic acid of 8.9, pH 7.17, creatinine 3.1 (baseline 1.2-1.4), bili 3.4, AST 1997, ALT 1128, WBC 8.6.  CT abd/pelvis significant for cholelithiasis without evidence of cholecystitis  Pertinent  Medical History   has a past medical history of Arthritis, ED (erectile dysfunction), Elevated serum creatinine (11/2020), GERD (gastroesophageal reflux disease), Headache, History of alcoholism (HCC), History of gout (09/2011), Hypercholesteremia, Hyperplasia of prostate with lower urinary tract symptoms (LUTS), Hypertension, OSA on CPAP (followed by dr Omar Bibber), Pre-diabetes, Prostate cancer [facility]) (urologist-  dr Claretta Croft  onologist-  dr Lorri Rota), Seasonal allergic rhinitis, and Weak urinary stream.   Significant Hospital Events: Including procedures, antibiotic start and stop dates in addition to other pertinent events   4/11 admit to ICU with AKI, cholecystitis and Afib  Interim History / Subjective:  Feels ok, pain is ok for now. Has not been eating. To OR today.  Objective    Blood pressure (!) 106/58, pulse (!) 57, temperature 97.7 F (36.5 C), temperature source Oral, resp. rate 14, height 5\' 10"  (1.778 m), weight 71 kg, SpO2 95%.        Intake/Output Summary (Last 24 hours) at 12/28/2023 1056 Last data filed at 12/28/2023 0600 Gross per 24 hour  Intake 2042.47 ml  Output 720 ml  Net 1322.47 ml   Filed Weights   12/26/23 1600 12/27/23 0500 12/28/23 0500  Weight: 69.4 kg 70.3 kg 71 kg    General:  chronically ill elderly man lying in bed in NAD HEENT: [place]/AT, eyes anicteric Neuro: more alert today, answering questions, not moving much CV: S1S2, RRR PULM: CTAB, breathing comfortably on [place] GI:  soft, ND Extremities: no cyanosis Skin: no diffuse rashes  K+ 2.9 BUN 54  Cr 2.77 AST 1528 ALT 1258 T bili 2.4 WBC 4.7 H/H 9.8/29.1 Platelets 151 INR 1.4  D-dimer 9.02  LE US  ordered  Blood cultures> NGTD   Echo: LVEF 60-65%, severely enlarged RV, normal PASP. Trivial MR,  IVC normal size and variability.    Resolved Hospital Problem list    Coagulopathy due to liver dysfunction, resolved  Assessment & Plan:   Septic shock by LA criteria; concern for cholelithiasis and choledocholithiasis Lactic acidosis due to sepsis Not requiring vasopressors -lap chole today, appreciate surgery -con't zosyn -hold PTA antihypertensives.  New- onset atrial fibrillation with RVR; back in NSR currently. Suspect this is due to acute illness. TSH WNL. -Con't amiodarone; poorly tolerant of dilt -if goes  back into Afib needs heparin; if he stays out he will have low risk of recurrence since this was due to sepsis and he does not need AC -tele monitoring  RV dilation on echo; unsure of chronicity. Luckily no PH by measurement. Elevated D-dimer, may be due to sepsis, but don't know for sure. Unsure if this could be more chronic.  -LE US  pending  Acute respiratory failure with hypoxia -wean O2 as able -pulmonary hygiene  Hypokalemia -repleted,  monitor  AKI superimposed on CKD 3b Acute on chronic metabolic acidosis; not resolved with improving lactic acidosis. Better on bicarb gtt -con't bicarb gtt -foley -renally dose meds, avoid nephrotoxic meds -maintain adequate perfusion  ETOH abuse and withdrawal- at least 7 days after last drink, not currently in DT's -vitamins, recommend cessation -hasn't needed CIWA ativan  Baseline OSA -CPAP at bedtime when alert and not having nausea  Dementia  -con't PTA aricept  H/o HTN -con't holding PTA antihypertensives  HLD -con't PTA statin  H/o Prediabetes -check A1c  -SSI PRN   Daughter Jorge Martin updated at bedside this morning.   Best Practice (right click and "Reselect all SmartList Selections" daily)   Diet/type: NPO w/ oral meds DVT prophylaxis SCD Pressure ulcer(s): N/A GI prophylaxis: PPI Lines: N/A Foley:  Yes, and it is still needed Code Status:  DNR Last date of multidisciplinary goals of care discussion [Daughters updated at the bedside, 4/11]  Labs   CBC: Recent Labs  Lab 12/26/23 0900 12/26/23 0923 12/28/23 0414  WBC 8.6  --  4.7  NEUTROABS 6.8  --   --   HGB 13.5 14.6 9.8*  HCT 43.2 43.0 29.1*  MCV 98.0  --  91.2  PLT 189  --  151    Basic Metabolic Panel: Recent Labs  Lab 12/26/23 0900 12/26/23 0923 12/26/23 1642 12/27/23 1153 12/28/23 0414  NA 140 137 139 139 137  K 4.3 4.0 3.9 3.6 2.9*  CL 100  --  105 102 96*  CO2 9*  --  14* 14* 25  GLUCOSE 135*  --  163* 110* 95  BUN 33*  --  36* 49* 54*  CREATININE 3.18*  --  2.80* 2.73* 2.77*  CALCIUM 9.5  --  8.3* 8.2* 7.4*  MG  --   --  1.1*  --   --    GFR: Estimated Creatinine Clearance: 18.9 mL/min (A) (by C-G formula based on SCr of 2.77 mg/dL (H)). Recent Labs  Lab 12/26/23 0900 12/26/23 1107 12/26/23 1340 12/26/23 1914 12/28/23 0414  WBC 8.6  --   --   --  4.7  LATICACIDVEN  --  8.9* 7.0* 3.0*  --     Liver Function Tests: Recent Labs  Lab 12/26/23 0900 12/26/23 1642  12/27/23 1153 12/28/23 0414  AST 1,997* 1,866* 2,319* 1,528*  ALT 1,128* 1,076* 1,525* 1,258*  ALKPHOS 84 62 70 82  BILITOT 3.4* 2.5* 2.2* 2.4*  PROT 6.9 5.4* 5.5* 4.9*  ALBUMIN 3.4* 2.6* 2.5* 2.2*   Recent Labs  Lab 12/26/23 0900  LIPASE 76*   Recent Labs  Lab 12/26/23 0933  AMMONIA 35     Critical care time:        Joesph Mussel, DO 12/28/23 11:02 AM [facility]  For contact information, see Amion. If no response to pager, please call PCCM consult pager. After hours, 7PM- 7AM, please call Elink.

## 2023-12-28 NOTE — Anesthesia Procedure Notes (Signed)
 Procedure Name: Intubation Date/Time: 12/28/2023 9:49 AM  Performed by: Annett Bart, CRNAPre-anesthesia Checklist: Patient identified, Emergency Drugs available, Suction available and Patient being monitored Patient Re-evaluated:Patient Re-evaluated prior to induction Oxygen Delivery Method: Circle System Utilized Preoxygenation: Pre-oxygenation with 100% oxygen Induction Type: IV induction, Rapid sequence and Cricoid Pressure applied Laryngoscope Size: Miller and 2 Grade View: Grade I Tube type: Oral Tube size: 7.0 mm Number of attempts: 1 Airway Equipment and Method: Stylet and Oral airway Placement Confirmation: ETT inserted through vocal cords under direct vision, positive ETCO2 and breath sounds checked- equal and bilateral Tube secured with: Tape Dental Injury: Teeth and Oropharynx as per pre-operative assessment

## 2023-12-28 NOTE — Op Note (Addendum)
 Patient: Jorge Martin MRN: [id number] DOB: 1935-08-08 Sex: male Operation/Procedure Date: 12/28/2023  Surgeons and Role:    * Davonna Estes Willeen Harold, MD - Primary      Pre-operative Diagnoses: Acute cholecystitis Postoperative Diagnoses: Gangrenous cholecystitis  Procedure performed: Laparoscopic cholecystectomy with intra-operative cholangiogram  Anesthesia: General endotracheal anesthesia  Indications: Jorge Martin is an 89 y/o M who presented to the ED with decreased PO intake and lethargy. Workup was concerning for cholecystitis and he had dilation of his CBD on imaging as well as an elevation in his bilirubin. Following discussion with the patient, the patient's daughters, and myself the decision was made to proceed to the OR for laparoscopic cholecystectomy with intra-operative cholangiogram. Preoperatively, I discussed in detail the risks, benefits, alternatives, and potential complications. The patient understands and requests to proceed.  Operative Findings: Cirrhotic liver. Gangrenous cholecystitis. No filling defect on IOC.    Operative Narrative: The patient was positively identified and was taken to the operating room and placed supine on the operating table. A time-out was performed confirming correct patient and procedure. We also confirmed initiation of deep venous thrombosis prophylaxis and wound prophylaxis. After successful induction of general endotracheal anesthesia, the arms were carefully padded. An orogastric tube and footboard were placed. The abdomen was prepped and draped in the usual sterile surgical fashion.  We began out access with a Veress needle in the LUQ at [place]'s point.  Following a aspiration of air and a positive saline drop test, the insufflation tubing was connected and the abdomen brought to a pressure of . A 5-mm port was placed right of the umbilicus using an opti-view technique. The abdomen was inspected and there were no signs of injury  from access.  We placed three additional ports all under direct vision, a 10-mm port in the subxiphoid position, one 5-mm port in the right midclavicular line, and one right subcostal port in the anterior axillary line. The patient was placed in the head up position and tilted slightly to the left. The liver was clearly cirrhotic and friable with multiple adhesions between the bowel and the gallbladder. The gallbladder was edematous and could not be grasped, so I aspirated it with a needle and noted thick, bilious drainage. The dome of the gallbladder was grasped, elevated, and retracted anteriorly and cephalad. The infundibulum was retracted laterally and inferiorly exposing Calot's triangle. The investing visceral peritoneal attachments overlying the infundibulum of the gallbladder were dissected free from the gallbladder itself. We soon developed two structures into the gallbladder consistent with the cystic duct and cystic artery. The loose areolar tissue around these structures was dissected free. The gallbladder was separated from the gallbladder fossa for approximately a third the distance up from the cystic plate, establishing the critical view of safety. We then performed a cholangiogram using a cook catheter inserted into the cystic duct and secured with a clip.This revealed adequate length of cystic duct entering into the common bile duct. The common hepatic duct right and left ducts were normal. There was appropriate sectoral branching seen bilaterally. There was no evidence of filling defect or stricture distally. There was prompt filling of the duodenum. The catheter was then removed and the cystic duct and cystic artery triply clipped. These were divided using laparoscopic scissors leaving a single clip on the removal side. The gallbladder was elevated off the gallbladder fossa using the hook Bovie. The gallbladder was then exteriorized through the subxiphoid position using a specimen bag. We  reestablished pneumoperitoneum and confirmed no  ongoing leakage of bile. There was an area along the lateral aspect of the fossa that was oozing. I brought a ray-tec into the abdomen and held pressure for several minutes. I examined the area and it was hemostatic. The subhepatic space was irrigated with warm sterile saline and suctioned free. Hemostatic agent was applied to the fossa. The subxiphoid port was removed and the defect closed with an interrupted 0 vicryl on a suture passer.  The other ports were removed under direct vision and the abdomen was desufflated. We placed 0.25% Marcaine plain at each incision site for local anesthesia. The skin was closed using 4-0 Monocryl subcuticular suture. Dermabond was applied. The patient tolerated the procedure well, was extubated, and taken to the recovery room.  Estimated Blood Loss: Specimens: Gallbladder Implants: None Drains: None Complications: None Condition of the patient: Good, extubated Disposition: PACU   CASE DATA: Type of patient?: DOW CASE (Surgical Hospitalist [facility]) Status of Case? URGENT Add On Infection Present At Time Of Surgery (PATOS)?   Gangrenous Gallbladder with perforation and abscess   Cannon Champion Date: 12/28/2023 Time: 11:36 AM

## 2023-12-28 NOTE — Anesthesia Postprocedure Evaluation (Signed)
 Anesthesia Post Note  Patient: KELCEY WICKSTROM  Procedure(s) Performed: LAPAROSCOPIC CHOLECYSTECTOMY WITH INTRAOPERATIVE CHOLANGIOGRAM     Patient location during evaluation: PACU Anesthesia Type: General Level of consciousness: awake and alert Pain management: pain level controlled Vital Signs Assessment: post-procedure vital signs reviewed and stable Respiratory status: spontaneous breathing, nonlabored ventilation, respiratory function stable and patient connected to face mask oxygen Cardiovascular status: blood pressure returned to baseline and stable Postop Assessment: no apparent nausea or vomiting Anesthetic complications: no  No notable events documented.  Last Vitals:  Vitals:   12/28/23 1200 12/28/23 1215  BP: 136/63 126/60  Pulse: 66 66  Resp: 20 (!) 21  Temp:  (!) 36.2 C  SpO2: 100% 98%    Last Pain:  Vitals:   12/28/23 1215  TempSrc:   PainSc: Asleep                 Ayah Cozzolino,W. EDMOND

## 2023-12-28 NOTE — Progress Notes (Signed)
 * Day of Surgery *  Subjective: CC: Patient seen in the pre-operative area. He remains somewhat confused. Denies abdominal pain.  Objective: Vital signs in last 24 hours: Temp:  [97.7 F (36.5 C)-98.3 F (36.8 C)] 98.3 F (36.8 C) (04/13 0737) Pulse Rate:  [55-77] 57 (04/13 0600) Resp:  [14-29] 14 (04/13 0600) BP: (87-121)/(56-80) 106/58 (04/13 0600) SpO2:  [91 %-100 %] 94 % (04/13 0600) Weight:  [71 kg] 71 kg (04/13 0500) Last BM Date : 12/27/23  Intake/Output from previous day: 04/12 0701 - 04/13 0700 In: 2091.5 [I.V.:1941.5; IV Piggyback:150] Out: 720 [Urine:720] Intake/Output this shift: No intake/output data recorded.  PE: Gen:  Alert, NAD, pleasant Abd: Soft, ND, RUQ ttp without peritonitis, otherwise NT. +BS  Lab Results:  Recent Labs    12/26/23 0900 12/26/23 0923 12/28/23 0414  WBC 8.6  --  4.7  HGB 13.5 14.6 9.8*  HCT 43.2 43.0 29.1*  PLT 189  --  151   BMET Recent Labs    12/27/23 1153 12/28/23 0414  NA 139 137  K 3.6 2.9*  CL 102 96*  CO2 14* 25  GLUCOSE 110* 95  BUN 49* 54*  CREATININE 2.73* 2.77*  CALCIUM 8.2* 7.4*   PT/INR Recent Labs    12/27/23 1153 12/28/23 0414  LABPROT 17.7* 17.0*  INR 1.4* 1.4*   CMP     Component Value Date/Time   NA 137 12/28/2023 0414   NA 141 11/13/2020 0928   K 2.9 (L) 12/28/2023 0414   CL 96 (L) 12/28/2023 0414   CO2 25 12/28/2023 0414   GLUCOSE 95 12/28/2023 0414   BUN 54 (H) 12/28/2023 0414   BUN 23 11/13/2020 0928   CREATININE 2.77 (H) 12/28/2023 0414   CREATININE 1.27 (H) 08/11/2017 1042   CALCIUM 7.4 (L) 12/28/2023 0414   PROT 4.9 (L) 12/28/2023 0414   PROT 6.9 11/13/2020 0928   ALBUMIN 2.2 (L) 12/28/2023 0414   ALBUMIN 4.5 11/13/2020 0928   AST 1,528 (H) 12/28/2023 0414   ALT 1,258 (H) 12/28/2023 0414   ALKPHOS 82 12/28/2023 0414   BILITOT 2.4 (H) 12/28/2023 0414   BILITOT 0.6 11/13/2020 0928   GFRNONAA 21 (L) 12/28/2023 0414   GFRNONAA 53 (L) 08/11/2017 1042   GFRAA 49 (L)  11/05/2019 1540   GFRAA 61 08/11/2017 1042   Lipase     Component Value Date/Time   LIPASE 76 (H) 12/26/2023 0900    Studies/Results: NM Hepatobiliary Liver Func Result Date: 12/27/2023 CLINICAL DATA:  Concern for cholecystitis. EXAM: NUCLEAR MEDICINE HEPATOBILIARY IMAGING TECHNIQUE: Sequential images of the abdomen were obtained out to 60 minutes following intravenous administration of radiopharmaceutical. RADIOPHARMACEUTICALS:  5.3 mCi Tc-67m  Choletec IV COMPARISON:  CT 12/26/2023 FINDINGS: Prompt clearance of radiotracer from blood pool and homogeneous uptake liver. Counts are present in the small bowel by 60 minutes. The gallbladder is not identified at 60 minutes. 2.5 mg IV morphine was administered to augment filling of the gallbladder. Non filling of the gallbladder following morphine augmentation. IMPRESSION: Non filling of the gallbladder suggests cystic duct obstruction/acute cholecystitis. Of note, fasting state of greater than 48 hours can cause false positives. Electronically Signed   By: Deboraha Fallow M.D.   On: 12/27/2023 17:31   ECHOCARDIOGRAM COMPLETE Result Date: 12/27/2023    ECHOCARDIOGRAM REPORT   Patient Name:   MILES LEYDA Date of Exam: 12/27/2023 Medical Rec #:  [id number]        Height:  70.0 in Accession #:    [id number]       Weight:       155.0 lb Date of Birth:  07/08/1935        BSA:          1.873 m Patient Age:    89 years         BP:           121/70 mmHg Patient Gender: M                HR:           61 bpm. Exam Location:  Inpatient Procedure: 2D Echo, Color Doppler, Cardiac Doppler and Intracardiac            Opacification Agent (Both Spectral and Color Flow Doppler were            utilized during procedure). Indications:    I48.91* Unspecified atrial fibrillation  History:        Patient has no prior history of Echocardiogram examinations.                 Arrythmias:Atrial Fibrillation; Risk Factors:Hypertension,                 Dyslipidemia and Sleep  Apnea.  Sonographer:    Sherline Distel Senior RDCS Referring Phys: [id number] Margie Sheller  Sonographer Comments: Very difficult study due to rib/lung interference IMPRESSIONS  1. Left ventricular ejection fraction, by estimation, is 60 to 65%. Left ventricular ejection fraction by PLAX is 62 %. The left ventricle has normal function. Left ventricular endocardial border not optimally defined to evaluate regional wall motion. Left ventricular diastolic parameters are indeterminate.  2. Right ventricular systolic function is normal. The right ventricular size is severely enlarged. There is normal pulmonary artery systolic pressure. The estimated right ventricular systolic pressure is 24.9 mmHg.  3. The mitral valve is normal in structure. Trivial mitral valve regurgitation.  4. The aortic valve has an indeterminant number of cusps. Aortic valve regurgitation is not visualized.  5. The inferior vena cava is normal in size with greater than 50% respiratory variability, suggesting right atrial pressure of 3 mmHg. Comparison(s): No prior Echocardiogram. FINDINGS  Left Ventricle: No strain or 3D transmitted. Left ventricular ejection fraction, by estimation, is 60 to 65%. Left ventricular ejection fraction by PLAX is 62 %. The left ventricle has normal function. Left ventricular endocardial border not optimally defined to evaluate regional wall motion. Definity contrast agent was given IV to delineate the left ventricular endocardial borders. Strain was performed and the global longitudinal strain is indeterminate. The left ventricular internal cavity size was normal in size. There is borderline concentric left ventricular hypertrophy. Left ventricular diastolic parameters are indeterminate. Right Ventricle: The right ventricular size is severely enlarged. No increase in right ventricular wall thickness. Right ventricular systolic function is normal. There is normal pulmonary artery systolic pressure. The tricuspid regurgitant  velocity is 2.34 m/s, and with an assumed right atrial pressure of 3 mmHg, the estimated right ventricular systolic pressure is 24.9 mmHg. Left Atrium: Left atrial size was normal in size. Right Atrium: Right atrial size was normal in size. Pericardium: There is no evidence of pericardial effusion. Mitral Valve: The mitral valve is normal in structure. Trivial mitral valve regurgitation. Tricuspid Valve: The tricuspid valve is normal in structure. Tricuspid valve regurgitation is mild. Aortic Valve: The aortic valve has an indeterminant number of cusps. Aortic valve regurgitation is not visualized. Pulmonic Valve: The pulmonic valve  was not well visualized. Pulmonic valve regurgitation is not visualized. Aorta: The ascending aorta was not well visualized and the aortic root is normal in size and structure. Venous: The inferior vena cava is normal in size with greater than 50% respiratory variability, suggesting right atrial pressure of 3 mmHg. IAS/Shunts: The atrial septum is grossly normal. Additional Comments: 3D was performed not requiring image post processing on an independent workstation and was indeterminate.  LEFT VENTRICLE PLAX 2D LV EF:         Left ventricular ejection fraction by PLAX is 62 %. LVIDd:         3.70 cm LVIDs:         2.50 cm LV PW:         1.00 cm LV IVS:        1.10 cm LVOT diam:     2.00 cm LVOT Area:     3.14 cm  RIGHT VENTRICLE RV S prime:     13.20 cm/s TAPSE (M-mode): 2.9 cm LEFT ATRIUM             Index        RIGHT ATRIUM           Index LA diam:        3.90 cm 2.08 cm/m   RA Area:     17.60 cm LA Vol (A2C):   36.4 ml 19.43 ml/m  RA Volume:   45.70 ml  24.40 ml/m LA Vol (A4C):   31.2 ml 16.66 ml/m LA Biplane Vol: 35.2 ml 18.79 ml/m   AORTA Ao Root diam: 3.50 cm Ao Asc diam:  3.60 cm TRICUSPID VALVE TR Peak grad:   21.9 mmHg TR Vmax:        234.00 cm/s  SHUNTS Systemic Diam: 2.00 cm Gloriann Larger MD Electronically signed by Gloriann Larger MD Signature Date/Time:  12/27/2023/2:02:38 PM    Final    DG CHEST PORT 1 VIEW Result Date: 12/27/2023 CLINICAL DATA:  Vomiting with weakness and fatigue. EXAM: PORTABLE CHEST 1 VIEW COMPARISON:  12/26/2023 FINDINGS: Similar asymmetric elevation right hemidiaphragm. Similar right infrahilar density compatible with right lower lung collapse/consolidation. Lungs otherwise clear. The cardio pericardial silhouette is enlarged. Telemetry leads overlie the chest. IMPRESSION: Similar right infrahilar density compatible with right lower lung collapse/consolidation. Electronically Signed   By: Donnal Fusi M.D.   On: 12/27/2023 08:38   CT ABDOMEN PELVIS WO CONTRAST Result Date: 12/26/2023 CLINICAL DATA:  nvd, abd pain EXAM: CT ABDOMEN AND PELVIS WITHOUT CONTRAST TECHNIQUE: Multidetector CT imaging of the abdomen and pelvis was performed following the standard protocol without IV contrast. Of note, the lack of intravenous contrast limits evaluation of the solid organ parenchyma and vascularity. RADIATION DOSE REDUCTION: This exam was performed according to the departmental dose-optimization program which includes automated exposure control, adjustment of the mA and/or kV according to patient size and/or use of iterative reconstruction technique. COMPARISON:  March 30, 2021, June 19, 2022 FINDINGS: Lower chest: No focal airspace consolidation or pleural effusion.Elevation of the right hemidiaphragm with subsegmental and posterior right basilar dependent atelectasis. Hepatobiliary: Severe diffuse hepatic steatosis. No mass.The gallbladder is distended and diffusely fluid-filled. Multiple small radiopaque stones. Subtle inflammation within the porta hepatis and gallbladder neck/downstream body.No intrahepatic or extrahepatic biliary ductal dilation. No obstructive, radiopaque choledocholithiasis. Pancreas: Mild parenchymal atrophy. No mass. Subtle fatty induration about the pancreatic head and uncinate process.No well-formed or drainable  peripancreatic fluid collection. Spleen: Normal size. No mass. Adrenals/Urinary Tract: No adrenal masses. No  mass. A few small cysts noted in both kidneys. No hydronephrosis or nephrolithiasis. Mild circumferential wall thickening of the urinary bladder. Stomach/Bowel:Small sliding-type hiatal hernia. The stomach contains ingested material without focal abnormality. No small bowel wall thickening or inflammation. No small bowel obstruction. Normal appendix. Vascular/Lymphatic: No aortic aneurysm. Diffuse aortoiliac atherosclerosis. No intraabdominal or pelvic lymphadenopathy. Reproductive: Moderate prostatomegaly. Likely fiducial markers within the prostate gland.No free pelvic fluid. Other: No pneumoperitoneum, ascites, or mesenteric inflammation. Musculoskeletal: No acute fracture or destructive lesion.Small volume symmetric bilateral gynecomastia. Multilevel degenerative disc disease of the spine. Mild grade 1 anterolisthesis of L4 on L5. IMPRESSION: 1. Fluid-filled, distended gallbladder with multiple radiopaque stones. There is induration of the pericholecystic fat around the gallbladder neck and downstream body with similar inflammation surrounding the pancreatic head and uncinate process. This could represent changes of early acute cholecystitis or acute interstitial edematous pancreatitis. Alternatively, these changes could also reflect findings of acute hepatitis. Correlation with serum lipase and liver enzymes recommended. 2. Circumferential wall thickening of the urinary bladder, which may be due to underdistension, chronic bladder outlet obstruction, or acute cystitis. Correlation with urinalysis is recommended. 3. Moderate prostatomegaly. 4. Small sliding-type hiatal hernia. Electronically Signed   By: Rance Burrows M.D.   On: 12/26/2023 14:11   US  Abdomen Limited RUQ (LIVER/GB) Result Date: 12/26/2023 CLINICAL DATA:  Elevated liver function tests EXAM: ULTRASOUND ABDOMEN LIMITED RIGHT UPPER  QUADRANT COMPARISON:  CT abdomen pelvis 06/19/2022 FINDINGS: Gallbladder: Gallstones: Present Sludge: Present Gallbladder Wall: Within normal limits Pericholecystic fluid: None Sonographic Murphy's Sign: Negative per technologist Common bile duct: Diameter: 10 mm Liver: Parenchymal echogenicity: Diffusely increased Contours: Normal Lesions: None Portal vein: Patent.  Hepatopetal flow Other: None. IMPRESSION: 1. Cholelithiasis without additional sonographic evidence of acute cholecystitis. 2. Dilated common bile duct measuring up to 10 mm. If the patient's liver function tests are suggestive of biliary obstruction, further evaluation with MRI/MRCP should be performed. 3. Diffuse increased echogenicity of the hepatic parenchyma is a nonspecific indicator of hepatocellular dysfunction, most commonly steatosis. Electronically Signed   By: Elester Grim M.D.   On: 12/26/2023 12:19   DG Chest Port 1 View Result Date: 12/26/2023 CLINICAL DATA:  Possible sepsis. EXAM: PORTABLE CHEST 1 VIEW COMPARISON:  07/25/2014 FINDINGS: Normal sized heart. Mildly tortuous aorta. Stable large right diaphragmatic eventration with minimal right basilar atelectasis/scarring. Clear left lung. Unremarkable bones. IMPRESSION: 1. No acute abnormality. 2. Stable large right diaphragmatic eventration with minimal right basilar atelectasis/scarring. Electronically Signed   By: Catherin Closs M.D.   On: 12/26/2023 10:39   CT HEAD WO CONTRAST Result Date: 12/26/2023 CLINICAL DATA:  Tremors EXAM: CT HEAD WITHOUT CONTRAST TECHNIQUE: Contiguous axial images were obtained from the base of the skull through the vertex without intravenous contrast. RADIATION DOSE REDUCTION: This exam was performed according to the departmental dose-optimization program which includes automated exposure control, adjustment of the mA and/or kV according to patient size and/or use of iterative reconstruction technique. COMPARISON:  Brain MRI 09/12/2017 FINDINGS: Brain: No  evidence of acute infarction, hemorrhage, hydrocephalus, extra-axial collection or mass lesion/mass effect. Generalized brain atrophy similar to prior. Mild chronic small vessel ischemia in the deep white matter. Vascular: No hyperdense vessel or unexpected calcification. Skull: Normal. Negative for fracture or focal lesion. Sinuses/Orbits: No acute finding. IMPRESSION: Aging brain without acute or reversible finding. No convincing change since a 2018 MRI. Electronically Signed   By: Ronnette Coke M.D.   On: 12/26/2023 10:01    Anti-infectives: Anti-infectives (From admission, onward)  Start     Dose/Rate Route Frequency Ordered Stop   12/27/23 1445  [MAR Hold]  piperacillin-tazobactam (ZOSYN) IVPB 2.25 g        (MAR Hold since Sun 12/28/2023 at 0852.Hold Reason: Transfer to a Procedural area)   2.25 g 100 mL/hr over 30 Minutes Intravenous Every 8 hours 12/27/23 1357     12/27/23 1100  ceFEPIme (MAXIPIME) 2 g in sodium chloride 0.9 % 100 mL IVPB  Status:  Discontinued        2 g 200 mL/hr over 30 Minutes Intravenous Every 24 hours 12/26/23 1704 12/27/23 1357   12/27/23 0100  metroNIDAZOLE (FLAGYL) IVPB 500 mg  Status:  Discontinued        500 mg 100 mL/hr over 60 Minutes Intravenous Every 12 hours 12/26/23 1703 12/27/23 1357   12/26/23 2200  metroNIDAZOLE (FLAGYL) IVPB 500 mg  Status:  Discontinued        500 mg 100 mL/hr over 60 Minutes Intravenous Every 12 hours 12/26/23 1654 12/26/23 1703   12/26/23 1704  vancomycin variable dose per unstable renal function (pharmacist dosing)  Status:  Discontinued         Does not apply See admin instructions 12/26/23 1704 12/27/23 1348   12/26/23 1015  ceFEPIme (MAXIPIME) 2 g in sodium chloride 0.9 % 100 mL IVPB        2 g 200 mL/hr over 30 Minutes Intravenous  Once 12/26/23 1001 12/26/23 1300   12/26/23 1015  metroNIDAZOLE (FLAGYL) IVPB 500 mg        500 mg 100 mL/hr over 60 Minutes Intravenous  Once 12/26/23 1001 12/26/23 1501   12/26/23 1015   vancomycin (VANCOCIN) IVPB 1000 mg/200 mL premix        1,000 mg 200 mL/hr over 60 Minutes Intravenous  Once 12/26/23 1001 12/26/23 1618        Assessment/Plan Cholelithiasis  Elevated LFT's -  RUQ U/S shows cholelithiasis without features of cholecystitis, with a dilated common bile duct (10 mm) - CT abdomen/pelvis W/O contrast shows a distended gallbladder with cholelithiasis and surrounding inflammatory changes, including edema of the pancreatitic head. Lipase 76 - HIDA 4/12 suggestive of cholecystitis but complicated by hepatic dysfunction - I had a discussion with the patient's daughter today. I do believe that he has gallbladder disease that has contributed to his clinical picture.  He is elderly and has a history of EtOH use but he had a reassuring echo yesterday and is of reasonable risk to proceed with surgery. I discussed the risks of surgery with his daughter, Catha Clink, including but not limited to: bleeding, infection, damage to bowel or surrounding structures, bile leak, damage to the biliary system, pancreatitis, retained stone, need for subtotal cholecystectomy, and need for additional procedures. Written consent was obtained.    FEN - NPO till final GI recs.  VTE - SCDs, okay for chem ppx from a general surgery standpoint. If felt to need therapeutic anticoagulation would favor heparin gtt or therapeutic lovenox  ID - Cefepime/Flagyl/Vanc.  Foley - In place Dispo - Admit to TRH/CCM.    Presented w/ shock with signs of acute kidney and liver dysfunction  Lactic acidosis - improving A.fib with RVR - on amio gtt AKI on CKD - cr improving Reported black stools - fecal occult blood negative  EtOH abuse  HTN HLD OSA on CPAP prediabetes PMH Prostate CA s/p radiation therapy, Dr. Jodeane Mulligan. Lorri Rota Headaches GERD  I reviewed nursing notes, Consultant GI notes, hospitalist notes, last 24 h  vitals and pain scores, last 48 h intake and output, last 24 h labs and trends, and  last 24 h imaging results.   LOS: 2 days    Cannon Champion, MD [facility] Surgery 12/28/2023, 9:11 AM Please see Amion for pager number during day hours 7:00am-4:30pm

## 2023-12-29 ENCOUNTER — Inpatient Hospital Stay (HOSPITAL_COMMUNITY)

## 2023-12-29 ENCOUNTER — Encounter (HOSPITAL_COMMUNITY): Payer: Self-pay | Admitting: General Surgery

## 2023-12-29 DIAGNOSIS — N179 Acute kidney failure, unspecified: Secondary | ICD-10-CM | POA: Diagnosis not present

## 2023-12-29 DIAGNOSIS — I480 Paroxysmal atrial fibrillation: Secondary | ICD-10-CM | POA: Diagnosis not present

## 2023-12-29 DIAGNOSIS — D649 Anemia, unspecified: Secondary | ICD-10-CM

## 2023-12-29 DIAGNOSIS — R7989 Other specified abnormal findings of blood chemistry: Secondary | ICD-10-CM

## 2023-12-29 DIAGNOSIS — R609 Edema, unspecified: Secondary | ICD-10-CM | POA: Diagnosis not present

## 2023-12-29 LAB — PROTIME-INR
INR: 1.3 — ABNORMAL HIGH (ref 0.8–1.2)
Prothrombin Time: 16 s — ABNORMAL HIGH (ref 11.4–15.2)

## 2023-12-29 LAB — COMPREHENSIVE METABOLIC PANEL WITH GFR
ALT: 792 U/L — ABNORMAL HIGH (ref 0–44)
AST: 576 U/L — ABNORMAL HIGH (ref 15–41)
Albumin: 2.6 g/dL — ABNORMAL LOW (ref 3.5–5.0)
Alkaline Phosphatase: 92 U/L (ref 38–126)
Anion gap: 13 (ref 5–15)
BUN: 53 mg/dL — ABNORMAL HIGH (ref 8–23)
CO2: 26 mmol/L (ref 22–32)
Calcium: 7.8 mg/dL — ABNORMAL LOW (ref 8.9–10.3)
Chloride: 99 mmol/L (ref 98–111)
Creatinine, Ser: 2.59 mg/dL — ABNORMAL HIGH (ref 0.61–1.24)
GFR, Estimated: 23 mL/min — ABNORMAL LOW (ref >60)
Glucose, Bld: 154 mg/dL — ABNORMAL HIGH (ref 70–99)
Potassium: 4 mmol/L (ref 3.5–5.1)
Sodium: 138 mmol/L (ref 135–145)
Total Bilirubin: 2.4 mg/dL — ABNORMAL HIGH (ref 0.0–1.2)
Total Protein: 5.2 g/dL — ABNORMAL LOW (ref 6.5–8.1)

## 2023-12-29 LAB — CBC
HCT: 25.3 % — ABNORMAL LOW (ref 39.0–52.0)
Hemoglobin: 8.8 g/dL — ABNORMAL LOW (ref 13.0–17.0)
MCH: 30.7 pg (ref 26.0–34.0)
MCHC: 34.8 g/dL (ref 30.0–36.0)
MCV: 88.2 fL (ref 80.0–100.0)
Platelets: 165 10*3/uL (ref 150–400)
RBC: 2.87 MIL/uL — ABNORMAL LOW (ref 4.22–5.81)
RDW: 18 % — ABNORMAL HIGH (ref 11.5–15.5)
WBC: 4.9 10*3/uL (ref 4.0–10.5)
nRBC: 0 % (ref 0.0–0.2)

## 2023-12-29 MED ORDER — TAMSULOSIN HCL 0.4 MG PO CAPS
0.4000 mg | ORAL_CAPSULE | Freq: Every day | ORAL | Status: DC
  Administered 2023-12-29 – 2023-12-30 (×2): 0.4 mg via ORAL
  Filled 2023-12-29 (×2): qty 1

## 2023-12-29 NOTE — TOC Initial Note (Addendum)
 Transition of Care [facility]) - Initial/Assessment Note    Patient Details  Name: Jorge Martin MRN: [id number] Date of Birth: March 31, 1935  [facility] CM/SW Contact:    Delilah Shan, LCSWA Phone Number: 12/29/2023, 12:50 PM  Clinical Narrative:                    CSW received consult for possible SNF placement at time of discharge. CSW spoke with patient and his two daughters Jorge Martin and Jorge Martin at bedside regarding PT recommendation of SNF placement at time of discharge. PTA patient reports he comes from home alone. Patient expressed understanding of PT recommendation and is agreeable to SNF placement at time of discharge. Patient reports preference for [facility]. Patient gave CSW permission to fax out initial referral to [facility] for possible SNF bed. If unable to get SNF bed at [facility] patient would like to discuss [facility] options.CSW discussed insurance authorization process and will provide Medicare SNF ratings list with accepted SNF bed offers when available.Patients daughters confirmed they can bring cpap from home to facility. No further questions reported at this time. CSW to continue to follow and assist with discharge planning needs.   Update- Nicki with [facility] confirmed she can offer SNF bed for patient. Patient accepted SNF bed offer. CSW will continue to follow.  Expected Discharge Plan: Skilled Nursing Facility Barriers to Discharge: Continued Medical Work up   Patient Goals and CMS Choice Patient states their goals for this hospitalization and ongoing recovery are:: SNF if unable to get bed with [facility] then would like to discuss [facility] options   Choice offered to / list presented to : Patient, Adult Children (patient and daughters Jorge Martin and Jorge Martin)      Expected Discharge Plan and Services In-house Referral: Clinical Social Work     Living arrangements for the past 2 months: Single Family Home                                       Prior Living Arrangements/Services Living arrangements for the past 2 months: Single Family Home Lives with:: Self   Do you feel safe going back to the place where you live?: No   SNF  Need for Family Participation in Patient Care: Yes (Comment) Care giver support system in place?: Yes (comment)   Criminal Activity/Legal Involvement Pertinent to Current Situation/Hospitalization: No - Comment as needed  Activities of Daily Living   ADL Screening (condition at time of admission) Independently performs ADLs?: Yes (appropriate for developmental age) Is the patient deaf or have difficulty hearing?: No Does the patient have difficulty seeing, even when wearing glasses/contacts?: No Does the patient have difficulty concentrating, remembering, or making decisions?: No  Permission Sought/Granted Permission sought to share information with : Case Manager, Family Supports, Photographer granted to share info w AGENCY: SNF        Emotional Assessment Appearance:: Appears stated age Attitude/Demeanor/Rapport: Gracious Affect (typically observed): Calm Orientation: : Oriented to Self, Oriented to Place, Oriented to  Time, Oriented to Situation Alcohol / Substance Use: Not Applicable Psych Involvement: No (comment)  Admission diagnosis:  Atrial fibrillation (HCC) [I48.91] Patient Active Problem List   Diagnosis Date Noted   Acute cholecystitis 12/27/2023   Septic shock (HCC) 12/27/2023   Atrial fibrillation with rapid ventricular response (HCC) 12/27/2023   Atrial fibrillation (  HCC) 12/26/2023   AKI (acute kidney injury) (HCC) 12/26/2023   Lactic acidosis 12/26/2023   Elevated LFTs 12/26/2023   Pain in pelvis 01/14/2020   Arthralgia of hip 01/14/2020   Vitamin D deficiency 01/14/2020   Weak urinary stream 11/07/2019   Burning with urination 11/07/2019   OSA on CPAP 10/21/2018   Malignant neoplasm of prostate (HCC) 12/24/2017   HTN  (hypertension) 02/04/2017   Acid reflux 02/04/2017   Glaucoma 02/04/2017   PCP:  Vicente Graham, No Pharmacy:   [id number] Jonette Nestle, [place] - [address] AT [facility] [address] [place] [address] Phone: [phone] Fax: [phone]  [facility] - [place], [place] - [address] [place] [address] Phone: [phone] Fax: [phone]     Social Drivers of Health (SDOH) Social History: SDOH Screenings   Food Insecurity: No Food Insecurity (12/26/2023)  Housing: Low Risk  (12/26/2023)  Recent Concern: Housing - High Risk (12/26/2023)  Transportation Needs: No Transportation Needs (12/26/2023)  Utilities: Not At Risk (12/26/2023)  Depression (PHQ2-9): Low Risk  (05/16/2020)  Social Connections: Moderately Integrated (12/26/2023)  Tobacco Use: Low Risk  (12/28/2023)   SDOH Interventions:     Readmission Risk Interventions     No data to display

## 2023-12-29 NOTE — Progress Notes (Signed)
 Subjective: Patient seems to be doing fairly well after his cholecystectomy complains of generalized abdominal pain but denies having any nausea or vomiting.  He has not passed any flatus yet.  His LFTs have improved with his AST/ALT 576/792; AST/ALT were 1528/1258 yesterday and 2319 and 1525 respectively on 12/27/2023. He was noted to have acute cholecystitis with sepsis and hypotension and elevated lactic acid levels which caused "shock liver" and this seems to be improving slowly.  Creatinine today is 2.59.  Objective: Vital signs in last 24 hours: Temp:  [97.8 F (36.6 C)-98.6 F (37 C)] 97.8 F (36.6 C) (04/14 1131) Pulse Rate:  [54-65] 54 (04/14 1131) Resp:  [12-20] 16 (04/14 1131) BP: (99-136)/(58-76) 128/71 (04/14 1131) SpO2:  [91 %-100 %] 96 % (04/14 1131) Weight:  [69.8 kg] 69.8 kg (04/14 0500) Last BM Date : 12/28/23  Intake/Output from previous day: 04/13 0701 - 04/14 0700 In: 2641.6 [P.O.:720; I.V.:1371.6; IV Piggyback:549.9] Out: 1175 [Urine:975; Blood:200] Intake/Output this shift: No intake/output data recorded.  General appearance: alert, cooperative, appears stated age, fatigued, and mild distress Resp: clear to auscultation bilaterally Cardio: regular rate and rhythm, S1, S2 normal, no murmur, click, rub or gallop GI: soft, diffusely tender with no bowel sounds, no masses,  no organomegaly  Lab Results: Recent Labs    12/28/23 0414 12/29/23 0427  WBC 4.7 4.9  HGB 9.8* 8.8*  HCT 29.1* 25.3*  PLT 151 165   BMET Recent Labs    12/27/23 1153 12/28/23 0414 12/29/23 0427  NA 139 137 138  K 3.6 2.9* 4.0  CL 102 96* 99  CO2 14* 25 26  GLUCOSE 110* 95 154*  BUN 49* 54* 53*  CREATININE 2.73* 2.77* 2.59*  CALCIUM 8.2* 7.4* 7.8*   LFT Recent Labs    12/29/23 0427  PROT 5.2*  ALBUMIN 2.6*  AST 576*  ALT 792*  ALKPHOS 92  BILITOT 2.4*   PT/INR Recent Labs    12/28/23 0414 12/29/23 0427  LABPROT 17.0* 16.0*  INR 1.4* 1.3*   Hepatitis  Panel Recent Labs    12/26/23 1642  HEPBSAG NON REACTIVE  HCVAB NON REACTIVE  HEPAIGM NON REACTIVE  HEPBIGM NON REACTIVE   Studies/Results: DG Cholangiogram Operative Result Date: 12/28/2023 CLINICAL DATA:  Laparoscopic cholecystectomy. EXAM: INTRAOPERATIVE CHOLANGIOGRAM TECHNIQUE: Cholangiographic images from the C-arm fluoroscopic device were submitted for interpretation post-operatively. Please see the procedural report for the amount of contrast and the fluoroscopy time utilized. FLUOROSCOPY: Radiation Exposure Index (as provided by the fluoroscopic device): 3.2 mGy Kerma COMPARISON:  None Available. FINDINGS: 2 spot fluoro runs obtained during intraoperative cholangiography are provided. Initial run shows cannulation of the cystic duct with good opacification of the intrahepatic and proximal extrahepatic biliary system. No filling defect or contrast extravasation. 2nd series shows intrahepatic biliary ducts as well as the extrahepatic common duct and common bile duct to the level of the ampulla. There is no filling defect in the common bile duct to suggest choledocholithiasis. Contrast material flows freely through the ampulla into the lumen of the duodenum. No contrast extravasation. IMPRESSION: Negative intraoperative cholangiogram. Electronically Signed   By: Kennith Center M.D.   On: 12/28/2023 12:54   NM Hepatobiliary Liver Func Result Date: 12/27/2023 CLINICAL DATA:  Concern for cholecystitis. EXAM: NUCLEAR MEDICINE HEPATOBILIARY IMAGING TECHNIQUE: Sequential images of the abdomen were obtained out to 60 minutes following intravenous administration of radiopharmaceutical. RADIOPHARMACEUTICALS:  5.3 mCi Tc-68m  Choletec IV COMPARISON:  CT 12/26/2023 FINDINGS: Prompt clearance of radiotracer from blood pool  and homogeneous uptake liver. Counts are present in the small bowel by 60 minutes. The gallbladder is not identified at 60 minutes. 2.5 mg IV morphine was administered to augment filling of  the gallbladder. Non filling of the gallbladder following morphine augmentation. IMPRESSION: Non filling of the gallbladder suggests cystic duct obstruction/acute cholecystitis. Of note, fasting state of greater than 48 hours can cause false positives. Electronically Signed   By: Deboraha Fallow M.D.   On: 12/27/2023 17:31   Medications: I have reviewed the patient's current medications. Prior to Admission:  Medications Prior to Admission  Medication Sig Dispense Refill Last Dose/Taking   acetaminophen (TYLENOL) 500 MG tablet Take 1 tablet (500 mg total) by mouth in the morning and at bedtime. (May increase dose to 2 tablets (1,000 mg), 2 times a day if above dose is not effective). (Patient taking differently: Take 500 mg by mouth 2 (two) times daily as needed for moderate pain (pain score 4-6), fever or headache.) 60 tablet 6 Past Month   amLODipine (NORVASC) 10 MG tablet Take 10 mg by mouth daily.   12/25/2023 Evening   atorvastatin (LIPITOR) 20 MG tablet Take 1 tablet by mouth every evening. (Patient taking differently: Take 20 mg by mouth at bedtime.) 90 tablet 3 Past Week   Cyanocobalamin (VITAMIN B-12 PO) Take 1 tablet by mouth daily.   12/25/2023 Evening   donepezil (ARICEPT) 10 MG tablet Take 10 mg by mouth at bedtime.   Past Week   esomeprazole (NEXIUM) 40 MG capsule TAKE 1 CAPSULE BY MOUTH DAILY BEFORE BREAKFAST. (Patient taking differently: Take 40 mg by mouth daily.) 90 capsule 1 12/25/2023 Evening   fluticasone (FLONASE) 50 MCG/ACT nasal spray Place 2 sprays into both nostrils daily.   12/25/2023 Evening   losartan (COZAAR) 50 MG tablet Take 50 mg by mouth daily.   12/25/2023 Evening   mirtazapine (REMERON) 15 MG tablet Take 15 mg by mouth at bedtime.   Past Week   Scheduled:  acetaminophen  1,000 mg Oral Q6H   amiodarone  200 mg Oral BID   Chlorhexidine Gluconate Cloth  6 each Topical Daily   donepezil  10 mg Oral QHS   multivitamin with minerals  1 tablet Oral Daily   pantoprazole  (PROTONIX) IV  40 mg Intravenous Q12H   tamsulosin  0.4 mg Oral QPC supper   thiamine  100 mg Oral Daily   Continuous:  piperacillin-tazobactam (ZOSYN)  IV 2.25 g (12/29/23 1312)    Assessment/Plan: 1) Elevated liver enzymes due to ischemic hepatopathy secondary to acute gangrenous cholecystitis with cholelithiasis with acute kidney injury & lactic acidosis-improving.  2) Acute cholecystitis status post laparoscopic cholecystectomy 3) Acute kidney injury on superimposed on chronic kidney disease 4) Hypertension/hyperlipidemia. 5) OSA. 6) Prediabetes. 7) Alcohol abuse. 8) History of prostate cancer status postradiation therapy.  LOS: 3 days   Tami Falcon 12/29/2023, 12:59 PM

## 2023-12-29 NOTE — Progress Notes (Signed)
 1 Day Post-Op  Subjective: CC: No abdominal pain. Tolerating cld without n/v. No flatus or bm. Foley in place. Has been up to the chair.   Objective: Vital signs in last 24 hours: Temp:  [97.2 F (36.2 C)-98.6 F (37 C)] 97.8 F (36.6 C) (04/14 1131) Pulse Rate:  [54-66] 54 (04/14 1131) Resp:  [12-21] 16 (04/14 1131) BP: (99-136)/(58-76) 128/71 (04/14 1131) SpO2:  [91 %-100 %] 96 % (04/14 1131) Weight:  [69.8 kg] 69.8 kg (04/14 0500) Last BM Date : 12/28/23  Intake/Output from previous day: 04/13 0701 - 04/14 0700 In: 2641.6 [P.O.:720; I.V.:1371.6; IV Piggyback:549.9] Out: 1175 [Urine:975; Blood:200] Intake/Output this shift: No intake/output data recorded.  PE: Gen:  Alert, NAD, pleasant Abd: Soft, mild distension, appropriately tender around laparoscopic incisions, no rigidity or guarding and otherwise NT, +BS. Incisions with glue intact appears well and are without drainage, bleeding, or signs of infection.  Lab Results:  Recent Labs    12/28/23 0414 12/29/23 0427  WBC 4.7 4.9  HGB 9.8* 8.8*  HCT 29.1* 25.3*  PLT 151 165   BMET Recent Labs    12/28/23 0414 12/29/23 0427  NA 137 138  K 2.9* 4.0  CL 96* 99  CO2 25 26  GLUCOSE 95 154*  BUN 54* 53*  CREATININE 2.77* 2.59*  CALCIUM 7.4* 7.8*   PT/INR Recent Labs    12/28/23 0414 12/29/23 0427  LABPROT 17.0* 16.0*  INR 1.4* 1.3*   CMP     Component Value Date/Time   NA 138 12/29/2023 0427   NA 141 11/13/2020 0928   K 4.0 12/29/2023 0427   CL 99 12/29/2023 0427   CO2 26 12/29/2023 0427   GLUCOSE 154 (H) 12/29/2023 0427   BUN 53 (H) 12/29/2023 0427   BUN 23 11/13/2020 0928   CREATININE 2.59 (H) 12/29/2023 0427   CREATININE 1.27 (H) 08/11/2017 1042   CALCIUM 7.8 (L) 12/29/2023 0427   PROT 5.2 (L) 12/29/2023 0427   PROT 6.9 11/13/2020 0928   ALBUMIN 2.6 (L) 12/29/2023 0427   ALBUMIN 4.5 11/13/2020 0928   AST 576 (H) 12/29/2023 0427   ALT 792 (H) 12/29/2023 0427   ALKPHOS 92 12/29/2023  0427   BILITOT 2.4 (H) 12/29/2023 0427   BILITOT 0.6 11/13/2020 0928   GFRNONAA 23 (L) 12/29/2023 0427   GFRNONAA 53 (L) 08/11/2017 1042   GFRAA 49 (L) 11/05/2019 1540   GFRAA 61 08/11/2017 1042   Lipase     Component Value Date/Time   LIPASE 76 (H) 12/26/2023 0900    Studies/Results: DG Cholangiogram Operative Result Date: 12/28/2023 CLINICAL DATA:  Laparoscopic cholecystectomy. EXAM: INTRAOPERATIVE CHOLANGIOGRAM TECHNIQUE: Cholangiographic images from the C-arm fluoroscopic device were submitted for interpretation post-operatively. Please see the procedural report for the amount of contrast and the fluoroscopy time utilized. FLUOROSCOPY: Radiation Exposure Index (as provided by the fluoroscopic device): 3.2 mGy Kerma COMPARISON:  None Available. FINDINGS: 2 spot fluoro runs obtained during intraoperative cholangiography are provided. Initial run shows cannulation of the cystic duct with good opacification of the intrahepatic and proximal extrahepatic biliary system. No filling defect or contrast extravasation. 2nd series shows intrahepatic biliary ducts as well as the extrahepatic common duct and common bile duct to the level of the ampulla. There is no filling defect in the common bile duct to suggest choledocholithiasis. Contrast material flows freely through the ampulla into the lumen of the duodenum. No contrast extravasation. IMPRESSION: Negative intraoperative cholangiogram. Electronically Signed   By: Jamison Oka.D.  On: 12/28/2023 12:54   NM Hepatobiliary Liver Func Result Date: 12/27/2023 CLINICAL DATA:  Concern for cholecystitis. EXAM: NUCLEAR MEDICINE HEPATOBILIARY IMAGING TECHNIQUE: Sequential images of the abdomen were obtained out to 60 minutes following intravenous administration of radiopharmaceutical. RADIOPHARMACEUTICALS:  5.3 mCi Tc-24m  Choletec IV COMPARISON:  CT 12/26/2023 FINDINGS: Prompt clearance of radiotracer from blood pool and homogeneous uptake liver. Counts  are present in the small bowel by 60 minutes. The gallbladder is not identified at 60 minutes. 2.5 mg IV morphine was administered to augment filling of the gallbladder. Non filling of the gallbladder following morphine augmentation. IMPRESSION: Non filling of the gallbladder suggests cystic duct obstruction/acute cholecystitis. Of note, fasting state of greater than 48 hours can cause false positives. Electronically Signed   By: Deboraha Fallow M.D.   On: 12/27/2023 17:31   ECHOCARDIOGRAM COMPLETE Result Date: 12/27/2023    ECHOCARDIOGRAM REPORT   Patient Name:   Jorge Martin Date of Exam: 12/27/2023 Medical Rec #:  [id number]        Height:       70.0 in Accession #:    [id number]       Weight:       155.0 lb Date of Birth:  1935/02/05        BSA:          1.873 m Patient Age:    89 years         BP:           121/70 mmHg Patient Gender: M                HR:           61 bpm. Exam Location:  Inpatient Procedure: 2D Echo, Color Doppler, Cardiac Doppler and Intracardiac            Opacification Agent (Both Spectral and Color Flow Doppler were            utilized during procedure). Indications:    I48.91* Unspecified atrial fibrillation  History:        Patient has no prior history of Echocardiogram examinations.                 Arrythmias:Atrial Fibrillation; Risk Factors:Hypertension,                 Dyslipidemia and Sleep Apnea.  Sonographer:    Sherline Distel Senior RDCS Referring Phys: [id number] Margie Sheller  Sonographer Comments: Very difficult study due to rib/lung interference IMPRESSIONS  1. Left ventricular ejection fraction, by estimation, is 60 to 65%. Left ventricular ejection fraction by PLAX is 62 %. The left ventricle has normal function. Left ventricular endocardial border not optimally defined to evaluate regional wall motion. Left ventricular diastolic parameters are indeterminate.  2. Right ventricular systolic function is normal. The right ventricular size is severely enlarged. There is normal  pulmonary artery systolic pressure. The estimated right ventricular systolic pressure is 24.9 mmHg.  3. The mitral valve is normal in structure. Trivial mitral valve regurgitation.  4. The aortic valve has an indeterminant number of cusps. Aortic valve regurgitation is not visualized.  5. The inferior vena cava is normal in size with greater than 50% respiratory variability, suggesting right atrial pressure of 3 mmHg. Comparison(s): No prior Echocardiogram. FINDINGS  Left Ventricle: No strain or 3D transmitted. Left ventricular ejection fraction, by estimation, is 60 to 65%. Left ventricular ejection fraction by PLAX is 62 %. The left ventricle has normal function. Left ventricular endocardial border  not optimally defined to evaluate regional wall motion. Definity contrast agent was given IV to delineate the left ventricular endocardial borders. Strain was performed and the global longitudinal strain is indeterminate. The left ventricular internal cavity size was normal in size. There is borderline concentric left ventricular hypertrophy. Left ventricular diastolic parameters are indeterminate. Right Ventricle: The right ventricular size is severely enlarged. No increase in right ventricular wall thickness. Right ventricular systolic function is normal. There is normal pulmonary artery systolic pressure. The tricuspid regurgitant velocity is 2.34 m/s, and with an assumed right atrial pressure of 3 mmHg, the estimated right ventricular systolic pressure is 24.9 mmHg. Left Atrium: Left atrial size was normal in size. Right Atrium: Right atrial size was normal in size. Pericardium: There is no evidence of pericardial effusion. Mitral Valve: The mitral valve is normal in structure. Trivial mitral valve regurgitation. Tricuspid Valve: The tricuspid valve is normal in structure. Tricuspid valve regurgitation is mild. Aortic Valve: The aortic valve has an indeterminant number of cusps. Aortic valve regurgitation is not  visualized. Pulmonic Valve: The pulmonic valve was not well visualized. Pulmonic valve regurgitation is not visualized. Aorta: The ascending aorta was not well visualized and the aortic root is normal in size and structure. Venous: The inferior vena cava is normal in size with greater than 50% respiratory variability, suggesting right atrial pressure of 3 mmHg. IAS/Shunts: The atrial septum is grossly normal. Additional Comments: 3D was performed not requiring image post processing on an independent workstation and was indeterminate.  LEFT VENTRICLE PLAX 2D LV EF:         Left ventricular ejection fraction by PLAX is 62 %. LVIDd:         3.70 cm LVIDs:         2.50 cm LV PW:         1.00 cm LV IVS:        1.10 cm LVOT diam:     2.00 cm LVOT Area:     3.14 cm  RIGHT VENTRICLE RV S prime:     13.20 cm/s TAPSE (M-mode): 2.9 cm LEFT ATRIUM             Index        RIGHT ATRIUM           Index LA diam:        3.90 cm 2.08 cm/m   RA Area:     17.60 cm LA Vol (A2C):   36.4 ml 19.43 ml/m  RA Volume:   45.70 ml  24.40 ml/m LA Vol (A4C):   31.2 ml 16.66 ml/m LA Biplane Vol: 35.2 ml 18.79 ml/m   AORTA Ao Root diam: 3.50 cm Ao Asc diam:  3.60 cm TRICUSPID VALVE TR Peak grad:   21.9 mmHg TR Vmax:        234.00 cm/s  SHUNTS Systemic Diam: 2.00 cm Gloriann Larger MD Electronically signed by Gloriann Larger MD Signature Date/Time: 12/27/2023/2:02:38 PM    Final     Anti-infectives: Anti-infectives (From admission, onward)    Start     Dose/Rate Route Frequency Ordered Stop   12/27/23 1445  piperacillin-tazobactam (ZOSYN) IVPB 2.25 g        2.25 g 100 mL/hr over 30 Minutes Intravenous Every 8 hours 12/27/23 1357     12/27/23 1100  ceFEPIme (MAXIPIME) 2 g in sodium chloride 0.9 % 100 mL IVPB  Status:  Discontinued        2 g 200 mL/hr over 30 Minutes Intravenous  Every 24 hours 12/26/23 1704 12/27/23 1357   12/27/23 0100  metroNIDAZOLE (FLAGYL) IVPB 500 mg  Status:  Discontinued        500 mg 100 mL/hr  over 60 Minutes Intravenous Every 12 hours 12/26/23 1703 12/27/23 1357   12/26/23 2200  metroNIDAZOLE (FLAGYL) IVPB 500 mg  Status:  Discontinued        500 mg 100 mL/hr over 60 Minutes Intravenous Every 12 hours 12/26/23 1654 12/26/23 1703   12/26/23 1704  vancomycin variable dose per unstable renal function (pharmacist dosing)  Status:  Discontinued         Does not apply See admin instructions 12/26/23 1704 12/27/23 1348   12/26/23 1015  ceFEPIme (MAXIPIME) 2 g in sodium chloride 0.9 % 100 mL IVPB        2 g 200 mL/hr over 30 Minutes Intravenous  Once 12/26/23 1001 12/26/23 1300   12/26/23 1015  metroNIDAZOLE (FLAGYL) IVPB 500 mg        500 mg 100 mL/hr over 60 Minutes Intravenous  Once 12/26/23 1001 12/26/23 1501   12/26/23 1015  vancomycin (VANCOCIN) IVPB 1000 mg/200 mL premix        1,000 mg 200 mL/hr over 60 Minutes Intravenous  Once 12/26/23 1001 12/26/23 1618        Assessment/Plan POD 1 s/p Laparoscopic cholecystectomy with IOC for Gangrenous cholecystitis by Dr. Davonna Estes on 12/28/23 - IOC neg. LFT's overall trending down. Cont to monitor - Cont abx, 4d post op - Adv diet - Mobilize, PT - Pulm toilet   FEN - HH VTE - SCDs, okay for chem ppx from a general surgery standpoint. If felt to need therapeutic anticoagulation would favor heparin gtt or therapeutic lovenox until ensure hgb stable  ID - Zosyn Foley - In place, okay to d/c from our standpoint - per [facility]   Presented w/ shock with signs of acute kidney and liver dysfunction - out of ICU Lactic acidosis - improving New onset A. Fib - was in RVR. Back in sinus  AKI on CKD - cr improving Reported black stools - fecal occult blood negative  EtOH abuse  HTN HLD OSA on CPAP prediabetes PMH Prostate CA s/p radiation therapy, Dr. Jodeane Mulligan. Lorri Rota Headaches GERD   LOS: 3 days    Delton Filbert, [facility] 12/29/2023, 12:01 PM Please see Amion for pager number during day hours  7:00am-4:30pm

## 2023-12-29 NOTE — Plan of Care (Signed)

## 2023-12-29 NOTE — Progress Notes (Addendum)
 [facility] PROGRESS NOTE   Jorge Martin WUJ:[id number] DOB: Feb 26, 1935 DOA: 12/26/2023  PCP: Pcp, No  Brief History:  89 y.o. M with PMH of HTN and early Alzheimer's, HTN, OSA, Prostate Ca, CKD 3b  who has a history of ETOH abuse.  Apparently he had quit drinking alcohol for some time but then picked it up again couple of months prior to this admission.  He then quit again cold [place] 7 to 8 days prior to admission.  Subsequently he lost his appetite and started having nausea and vomiting.  He was found to have atrial fibrillation with RVR.  There was also concern for cholecystitis.  He was hospitalized for further management.    Consultants: General Surgery.  Critical care medicine.  Procedures: Laparoscopic cholecystectomy with intraoperative cholangiogram    Subjective/Interval History: Patient denies any abdominal pain this morning.  No nausea or vomiting.  No chest pain or shortness of breath.  Does complain of feeling fatigued.    Assessment/Plan:  Septic shock Most likely secondary to cholecystitis.  Did not require vasopressors but he was in the ICU. Blood pressure has stabilized.  Continue to monitor.  Acute cholecystitis/significantly abnormal LFTs Found to have abnormal LFTs with elevated AST ALT bilirubin.  Alkaline phosphatase normal. Underwent multiple studies including ultrasound as well as HIDA scan which raise concern for cholecystitis. Patient was seen by general surgery and underwent laparoscopic cholecystectomy on 4/13. LFTs are noted to be improving with the significant improvement in AST and ALT.  Bilirubin remains elevated.  Intraoperative cholangiogram was negative. Patient remains on Zosyn. Noted to be on clear liquid diets.  Defer diet advancement to surgery.  Antibiotics per general surgery.  WBC is noted to be normal.  New onset atrial fibrillation with RVR Was treated with amiodarone.  Back to sinus rhythm. Likely triggered by acute  illness.  Atrial fibrillation was for very short duration.  Not deemed to require anticoagulation. Continue amiodarone 200 mg twice a day for 1 week and then changed to 200 mg once a day for 2 weeks. Not on any other rate limiting drugs.  Normocytic anemia Significant drop in hemoglobin noted.  Hemoglobin was 13.5 at admission.  Noted to be 9.8 yesterday and 8.8 today.  No overt bleeding identified.  Hemoglobin was 11.6 in 2023.  Initial values during this admission might have been hemoconcentrated.  Continue to monitor for now.  Check anemia panel.  RV dilatation on echocardiogram No previous echoes available for comparison.  LVEF was normal.  RV function was normal. May benefit from outpatient cardiology evaluation.  Acute respiratory failure with hypoxia Seems to have resolved.  Has been weaned off of oxygen.  Acute kidney injury superimposed on chronic kidney disease stage IIIb/hypokalemia Creatinine in 2023 was 1.63. Came in with creatinine of 3.18.  No hydronephrosis noted on CT scan. Patient treated for infection and given IV fluids.  Renal function is gradually improving.  Creatinine is down to 2.59 today. Monitor urine output.  Avoid nephrotoxic agents. Potassium level is normal this morning Noted to have foley cathter. Can attempt voiding trial when mobility has improved. Perhaps as early as tomorrow. Start flomax.  History of alcoholism Not in withdrawal.  Continue with thiamine multivitamins.  History of dementia Continue with Aricept.  History of essential hypertension Prior to admission he was on amlodipine and losartan which are currently on hold.  Monitor blood pressures closely.  Hyperlipidemia On Lipitor which is currently on hold.  DVT Prophylaxis: SCDs Code Status:  DNR Family Communication: No family at bedside Disposition Plan: To be determined.  PT and OT eval     Medications: Scheduled:  acetaminophen  1,000 mg Oral Q6H   amiodarone  200 mg Oral BID    Chlorhexidine Gluconate Cloth  6 each Topical Daily   donepezil  10 mg Oral QHS   multivitamin with minerals  1 tablet Oral Daily   pantoprazole (PROTONIX) IV  40 mg Intravenous Q12H   thiamine  100 mg Oral Daily   Continuous:  piperacillin-tazobactam (ZOSYN)  IV 2.25 g (12/29/23 8119)   JYN:WGNFAOZH sodium, methocarbamol, mouth rinse, oxyCODONE, polyethylene glycol  Antibiotics: Anti-infectives (From admission, onward)    Start     Dose/Rate Route Frequency Ordered Stop   12/27/23 1445  piperacillin-tazobactam (ZOSYN) IVPB 2.25 g        2.25 g 100 mL/hr over 30 Minutes Intravenous Every 8 hours 12/27/23 1357     12/27/23 1100  ceFEPIme (MAXIPIME) 2 g in sodium chloride 0.9 % 100 mL IVPB  Status:  Discontinued        2 g 200 mL/hr over 30 Minutes Intravenous Every 24 hours 12/26/23 1704 12/27/23 1357   12/27/23 0100  metroNIDAZOLE (FLAGYL) IVPB 500 mg  Status:  Discontinued        500 mg 100 mL/hr over 60 Minutes Intravenous Every 12 hours 12/26/23 1703 12/27/23 1357   12/26/23 2200  metroNIDAZOLE (FLAGYL) IVPB 500 mg  Status:  Discontinued        500 mg 100 mL/hr over 60 Minutes Intravenous Every 12 hours 12/26/23 1654 12/26/23 1703   12/26/23 1704  vancomycin variable dose per unstable renal function (pharmacist dosing)  Status:  Discontinued         Does not apply See admin instructions 12/26/23 1704 12/27/23 1348   12/26/23 1015  ceFEPIme (MAXIPIME) 2 g in sodium chloride 0.9 % 100 mL IVPB        2 g 200 mL/hr over 30 Minutes Intravenous  Once 12/26/23 1001 12/26/23 1300   12/26/23 1015  metroNIDAZOLE (FLAGYL) IVPB 500 mg        500 mg 100 mL/hr over 60 Minutes Intravenous  Once 12/26/23 1001 12/26/23 1501   12/26/23 1015  vancomycin (VANCOCIN) IVPB 1000 mg/200 mL premix        1,000 mg 200 mL/hr over 60 Minutes Intravenous  Once 12/26/23 1001 12/26/23 1618       Objective:  Vital Signs  Vitals:   12/28/23 2304 12/29/23 0350 12/29/23 0500 12/29/23 0850  BP:  130/73 120/70  136/76  Pulse: 65 61  (!) 54  Resp: 20 14  16   Temp: 98 F (36.7 C) 98 F (36.7 C)  98.1 F (36.7 C)  TempSrc: Oral Oral  Oral  SpO2:  96%  100%  Weight:   69.8 kg   Height:        Intake/Output Summary (Last 24 hours) at 12/29/2023 1005 Last data filed at 12/29/2023 0544 Gross per 24 hour  Intake 2641.56 ml  Output 1075 ml  Net 1566.56 ml   Filed Weights   12/27/23 0500 12/28/23 0500 12/29/23 0500  Weight: 70.3 kg 71 kg 69.8 kg    General appearance: Awake alert.  In no distress Resp: Clear to auscultation bilaterally.  Normal effort Cardio: S1-S2 is normal regular.  No S3-S4.  No rubs murmurs or bruit GI: Abdomen is soft.  Nontender nondistended.  Bowel sounds are present normal.  No masses organomegaly Extremities: No edema.  Able to move his extremities but physical deconditioning is noted. Neurologic: No focal neurological deficits.    Lab Results:  Data Reviewed: I have personally reviewed following labs and reports of the imaging studies  CBC: Recent Labs  Lab 12/26/23 0900 12/26/23 0923 12/28/23 0414 12/29/23 0427  WBC 8.6  --  4.7 4.9  NEUTROABS 6.8  --   --   --   HGB 13.5 14.6 9.8* 8.8*  HCT 43.2 43.0 29.1* 25.3*  MCV 98.0  --  91.2 88.2  PLT 189  --  151 165    Basic Metabolic Panel: Recent Labs  Lab 12/26/23 0900 12/26/23 0923 12/26/23 1642 12/27/23 1153 12/28/23 0414 12/29/23 0427  NA 140 137 139 139 137 138  K 4.3 4.0 3.9 3.6 2.9* 4.0  CL 100  --  105 102 96* 99  CO2 9*  --  14* 14* 25 26  GLUCOSE 135*  --  163* 110* 95 154*  BUN 33*  --  36* 49* 54* 53*  CREATININE 3.18*  --  2.80* 2.73* 2.77* 2.59*  CALCIUM 9.5  --  8.3* 8.2* 7.4* 7.8*  MG  --   --  1.1*  --   --   --     GFR: Estimated Creatinine Clearance: 19.8 mL/min (A) (by C-G formula based on SCr of 2.59 mg/dL (H)).  Liver Function Tests: Recent Labs  Lab 12/26/23 0900 12/26/23 1642 12/27/23 1153 12/28/23 0414 12/29/23 0427  AST 1,997* 1,866* 2,319*  1,528* 576*  ALT 1,128* 1,076* 1,525* 1,258* 792*  ALKPHOS 84 62 70 82 92  BILITOT 3.4* 2.5* 2.2* 2.4* 2.4*  PROT 6.9 5.4* 5.5* 4.9* 5.2*  ALBUMIN 3.4* 2.6* 2.5* 2.2* 2.6*    Recent Labs  Lab 12/26/23 0900  LIPASE 76*   Recent Labs  Lab 12/26/23 0933  AMMONIA 35    Coagulation Profile: Recent Labs  Lab 12/26/23 1642 12/27/23 1153 12/28/23 0414 12/29/23 0427  INR 1.7* 1.4* 1.4* 1.3*    Cardiac Enzymes: Recent Labs  Lab 12/26/23 0900  CKTOTAL 32*   HbA1C: Recent Labs    12/28/23 0414  HGBA1C 5.6    CBG: Recent Labs  Lab 12/26/23 1123 12/26/23 1623  GLUCAP 116* 153*    Thyroid Function Tests: Recent Labs    12/26/23 1642  TSH 0.662     Recent Results (from the past 240 hours)  Resp panel by RT-PCR (RSV, Flu A&B, Covid) Urine, Clean Catch     Status: None   Collection Time: 12/26/23 10:00 AM   Specimen: Urine, Clean Catch; Nasal Swab  Result Value Ref Range Status   SARS Coronavirus 2 by RT PCR NEGATIVE NEGATIVE Final   Influenza A by PCR NEGATIVE NEGATIVE Final   Influenza B by PCR NEGATIVE NEGATIVE Final    Comment: (NOTE) The Xpert Xpress SARS-CoV-2/FLU/RSV plus assay is intended as an aid in the diagnosis of influenza from Nasopharyngeal swab specimens and should not be used as a sole basis for treatment. Nasal washings and aspirates are unacceptable for Xpert Xpress SARS-CoV-2/FLU/RSV testing.  Fact Sheet for Patients: BloggerCourse.com  Fact Sheet for Healthcare Providers: SeriousBroker.it  This test is not yet approved or cleared by the [place] FDA and has been authorized for detection and/or diagnosis of SARS-CoV-2 by FDA under an Emergency Use Authorization (EUA). This EUA will remain in effect (meaning this test can be used) for the duration of the COVID-19 declaration under Section 564(b)(1) of the Act, 21 U.S.C. section 360bbb-3(b)(1), unless  the authorization is  terminated or revoked.     Resp Syncytial Virus by PCR NEGATIVE NEGATIVE Final    Comment: (NOTE) Fact Sheet for Patients: BloggerCourse.com  Fact Sheet for Healthcare Providers: SeriousBroker.it  This test is not yet approved or cleared by the [place] FDA and has been authorized for detection and/or diagnosis of SARS-CoV-2 by FDA under an Emergency Use Authorization (EUA). This EUA will remain in effect (meaning this test can be used) for the duration of the COVID-19 declaration under Section 564(b)(1) of the Act, 21 U.S.C. section 360bbb-3(b)(1), unless the authorization is terminated or revoked.  Performed at [facility], [address], [place], [place] [address]   Blood Culture (routine x 2)     Status: None (Preliminary result)   Collection Time: 12/26/23 10:01 AM   Specimen: BLOOD LEFT HAND  Result Value Ref Range Status   Specimen Description BLOOD LEFT HAND  Final   Special Requests   Final    AEROBIC BOTTLE ONLY Blood Culture results may not be optimal due to an inadequate volume of blood received in culture bottles   Culture   Final    NO GROWTH 3 DAYS Performed at [facility], [address], [place], [place] [address]    Report Status PENDING  Incomplete  Blood Culture (routine x 2)     Status: None (Preliminary result)   Collection Time: 12/26/23 10:06 AM   Specimen: BLOOD LEFT HAND  Result Value Ref Range Status   Specimen Description BLOOD LEFT HAND  Final   Special Requests   Final    AEROBIC BOTTLE ONLY Blood Culture results may not be optimal due to an inadequate volume of blood received in culture bottles   Culture   Final    NO GROWTH 3 DAYS Performed at [facility], [address], [place], [place] [address]    Report Status PENDING  Incomplete  MRSA Next Gen by PCR, Nasal     Status: None   Collection Time: 12/26/23  4:43 PM   Specimen: Nasal Mucosa; Nasal Swab  Result  Value Ref Range Status   MRSA by PCR Next Gen NOT DETECTED NOT DETECTED Final    Comment: (NOTE) The GeneXpert MRSA Assay (FDA approved for NASAL specimens only), is one component of a comprehensive MRSA colonization surveillance program. It is not intended to diagnose MRSA infection nor to guide or monitor treatment for MRSA infections. Test performance is not FDA approved in patients less than 92 years old. Performed at [facility], [address] [place], [place] [address]       Radiology Studies: DG Cholangiogram Operative Result Date: 12/28/2023 CLINICAL DATA:  Laparoscopic cholecystectomy. EXAM: INTRAOPERATIVE CHOLANGIOGRAM TECHNIQUE: Cholangiographic images from the C-arm fluoroscopic device were submitted for interpretation post-operatively. Please see the procedural report for the amount of contrast and the fluoroscopy time utilized. FLUOROSCOPY: Radiation Exposure Index (as provided by the fluoroscopic device): 3.2 mGy Kerma COMPARISON:  None Available. FINDINGS: 2 spot fluoro runs obtained during intraoperative cholangiography are provided. Initial run shows cannulation of the cystic duct with good opacification of the intrahepatic and proximal extrahepatic biliary system. No filling defect or contrast extravasation. 2nd series shows intrahepatic biliary ducts as well as the extrahepatic common duct and common bile duct to the level of the ampulla. There is no filling defect in the common bile duct to suggest choledocholithiasis. Contrast material flows freely through the ampulla into the lumen of the duodenum.  No contrast extravasation. IMPRESSION: Negative intraoperative cholangiogram. Electronically Signed   By: Donnal Fusi M.D.   On: 12/28/2023 12:54   NM Hepatobiliary Liver Func Result Date: 12/27/2023 CLINICAL DATA:  Concern for cholecystitis. EXAM: NUCLEAR MEDICINE HEPATOBILIARY IMAGING TECHNIQUE: Sequential images of the abdomen were obtained out to 60 minutes  following intravenous administration of radiopharmaceutical. RADIOPHARMACEUTICALS:  5.3 mCi Tc-77m  Choletec IV COMPARISON:  CT 12/26/2023 FINDINGS: Prompt clearance of radiotracer from blood pool and homogeneous uptake liver. Counts are present in the small bowel by 60 minutes. The gallbladder is not identified at 60 minutes. 2.5 mg IV morphine was administered to augment filling of the gallbladder. Non filling of the gallbladder following morphine augmentation. IMPRESSION: Non filling of the gallbladder suggests cystic duct obstruction/acute cholecystitis. Of note, fasting state of greater than 48 hours can cause false positives. Electronically Signed   By: Deboraha Fallow M.D.   On: 12/27/2023 17:31   ECHOCARDIOGRAM COMPLETE Result Date: 12/27/2023    ECHOCARDIOGRAM REPORT   Patient Name:   Jorge Martin Date of Exam: 12/27/2023 Medical Rec #:  [id number]        Height:       70.0 in Accession #:    [id number]       Weight:       155.0 lb Date of Birth:  08/10/1935        BSA:          1.873 m Patient Age:    89 years         BP:           121/70 mmHg Patient Gender: M                HR:           61 bpm. Exam Location:  Inpatient Procedure: 2D Echo, Color Doppler, Cardiac Doppler and Intracardiac            Opacification Agent (Both Spectral and Color Flow Doppler were            utilized during procedure). Indications:    I48.91* Unspecified atrial fibrillation  History:        Patient has no prior history of Echocardiogram examinations.                 Arrythmias:Atrial Fibrillation; Risk Factors:Hypertension,                 Dyslipidemia and Sleep Apnea.  Sonographer:    Sherline Distel Senior RDCS Referring Phys: [id number] Margie Sheller  Sonographer Comments: Very difficult study due to rib/lung interference IMPRESSIONS  1. Left ventricular ejection fraction, by estimation, is 60 to 65%. Left ventricular ejection fraction by PLAX is 62 %. The left ventricle has normal function. Left ventricular endocardial border  not optimally defined to evaluate regional wall motion. Left ventricular diastolic parameters are indeterminate.  2. Right ventricular systolic function is normal. The right ventricular size is severely enlarged. There is normal pulmonary artery systolic pressure. The estimated right ventricular systolic pressure is 24.9 mmHg.  3. The mitral valve is normal in structure. Trivial mitral valve regurgitation.  4. The aortic valve has an indeterminant number of cusps. Aortic valve regurgitation is not visualized.  5. The inferior vena cava is normal in size with greater than 50% respiratory variability, suggesting right atrial pressure of 3 mmHg. Comparison(s): No prior Echocardiogram. FINDINGS  Left Ventricle: No strain or 3D transmitted. Left ventricular ejection fraction, by estimation, is 60 to 65%. Left  ventricular ejection fraction by PLAX is 62 %. The left ventricle has normal function. Left ventricular endocardial border not optimally defined to evaluate regional wall motion. Definity contrast agent was given IV to delineate the left ventricular endocardial borders. Strain was performed and the global longitudinal strain is indeterminate. The left ventricular internal cavity size was normal in size. There is borderline concentric left ventricular hypertrophy. Left ventricular diastolic parameters are indeterminate. Right Ventricle: The right ventricular size is severely enlarged. No increase in right ventricular wall thickness. Right ventricular systolic function is normal. There is normal pulmonary artery systolic pressure. The tricuspid regurgitant velocity is 2.34 m/s, and with an assumed right atrial pressure of 3 mmHg, the estimated right ventricular systolic pressure is 24.9 mmHg. Left Atrium: Left atrial size was normal in size. Right Atrium: Right atrial size was normal in size. Pericardium: There is no evidence of pericardial effusion. Mitral Valve: The mitral valve is normal in structure. Trivial  mitral valve regurgitation. Tricuspid Valve: The tricuspid valve is normal in structure. Tricuspid valve regurgitation is mild. Aortic Valve: The aortic valve has an indeterminant number of cusps. Aortic valve regurgitation is not visualized. Pulmonic Valve: The pulmonic valve was not well visualized. Pulmonic valve regurgitation is not visualized. Aorta: The ascending aorta was not well visualized and the aortic root is normal in size and structure. Venous: The inferior vena cava is normal in size with greater than 50% respiratory variability, suggesting right atrial pressure of 3 mmHg. IAS/Shunts: The atrial septum is grossly normal. Additional Comments: 3D was performed not requiring image post processing on an independent workstation and was indeterminate.  LEFT VENTRICLE PLAX 2D LV EF:         Left ventricular ejection fraction by PLAX is 62 %. LVIDd:         3.70 cm LVIDs:         2.50 cm LV PW:         1.00 cm LV IVS:        1.10 cm LVOT diam:     2.00 cm LVOT Area:     3.14 cm  RIGHT VENTRICLE RV S prime:     13.20 cm/s TAPSE (M-mode): 2.9 cm LEFT ATRIUM             Index        RIGHT ATRIUM           Index LA diam:        3.90 cm 2.08 cm/m   RA Area:     17.60 cm LA Vol (A2C):   36.4 ml 19.43 ml/m  RA Volume:   45.70 ml  24.40 ml/m LA Vol (A4C):   31.2 ml 16.66 ml/m LA Biplane Vol: 35.2 ml 18.79 ml/m   AORTA Ao Root diam: 3.50 cm Ao Asc diam:  3.60 cm TRICUSPID VALVE TR Peak grad:   21.9 mmHg TR Vmax:        234.00 cm/s  SHUNTS Systemic Diam: 2.00 cm Gloriann Larger MD Electronically signed by Gloriann Larger MD Signature Date/Time: 12/27/2023/2:02:38 PM    Final        LOS: 3 days   Maylene Spear  [facility] Pager on [email or web address]  12/29/2023, 10:05 AM

## 2023-12-29 NOTE — NC FL2 (Signed)
 [place] MEDICAID FL2 LEVEL OF CARE FORM     IDENTIFICATION  Patient Name: Jorge Martin Birthdate: [birth year] Sex: male Admission Date (Current Location): 12/26/2023  [facility] and [place] Number:  Producer, television/film/video and Address:  [facility], [address] [place], [place] [address]      Provider Number: [id number]  Attending Physician Name and Address:  Osvaldo Shipper, MD  Relative Name and Phone Number:  Bjorn Loser (daughter) [phone] Cyndia Bent (daughter) [phone]    Current Level of Care: Hospital Recommended Level of Care: Skilled Nursing Facility Prior Approval Number:    Date Approved/Denied:   PASRR Number: [id number]  Discharge Plan: SNF    Current Diagnoses: Patient Active Problem List   Diagnosis Date Noted   Acute cholecystitis 12/27/2023   Septic shock (HCC) 12/27/2023   Atrial fibrillation with rapid ventricular response (HCC) 12/27/2023   Atrial fibrillation (HCC) 12/26/2023   AKI (acute kidney injury) (HCC) 12/26/2023   Lactic acidosis 12/26/2023   Elevated LFTs 12/26/2023   Pain in pelvis 01/14/2020   Arthralgia of hip 01/14/2020   Vitamin D deficiency 01/14/2020   Weak urinary stream 11/07/2019   Burning with urination 11/07/2019   OSA on CPAP 10/21/2018   Malignant neoplasm of prostate (HCC) 12/24/2017   HTN (hypertension) 02/04/2017   Acid reflux 02/04/2017   Glaucoma 02/04/2017    Orientation RESPIRATION BLADDER Height & Weight     Self, Time, Situation, Place  Normal Continent (Urethral Catheter) Weight: 153 lb 14.1 oz (69.8 kg) Height:  5\' 10"  (177.8 cm)  BEHAVIORAL SYMPTOMS/MOOD NEUROLOGICAL BOWEL NUTRITION STATUS      Continent Diet (Please see discharge summary)  AMBULATORY STATUS COMMUNICATION OF NEEDS Skin   Extensive Assist Verbally Other (Comment) (Wound/Incision LDAs,Incision closed abdomen)                       Personal Care Assistance Level of Assistance  Bathing, Dressing  Bathing Assistance: Maximum assistance   Dressing Assistance: Maximum assistance     Functional Limitations Info  Sight, Hearing, Speech Sight Info: Impaired Hearing Info: Adequate Speech Info: Adequate    SPECIAL CARE FACTORS FREQUENCY  PT (By licensed PT), OT (By licensed OT)     PT Frequency: 5x min weekly OT Frequency: 5x min weekly            Contractures Contractures Info: Not present    Additional Factors Info  Code Status, Allergies, Psychotropic Code Status Info: DNR Allergies Info: NKA Psychotropic Info: donepezil (ARICEPT) tablet 10 mg daily at bedtime         Current Medications (12/29/2023):  This is the current hospital active medication list Current Facility-Administered Medications  Medication Dose Route Frequency Provider Last Rate Last Admin   acetaminophen (TYLENOL) tablet 1,000 mg  1,000 mg Oral Q6H Maczis, Elmer Sow, PA-C   1,000 mg at 12/29/23 1000   amiodarone (PACERONE) tablet 200 mg  200 mg Oral BID Silvana Newness, RPH   200 mg at 12/29/23 1000   Chlorhexidine Gluconate Cloth 2 % PADS 6 each  6 each Topical Daily Jacinto Halim, PA-C   6 each at 12/29/23 1000   docusate sodium (COLACE) capsule 100 mg  100 mg Oral BID PRN Jacinto Halim, PA-C       donepezil (ARICEPT) tablet 10 mg  10 mg Oral QHS Jacinto Halim, PA-C   10 mg at 12/28/23 2249   methocarbamol (ROBAXIN) tablet 500 mg  500  mg Oral Q6H PRN Maczis, Michael M, PA-C   500 mg at 12/29/23 9629   multivitamin with minerals tablet 1 tablet  1 tablet Oral Daily Maczis, Michael M, PA-C   1 tablet at 12/29/23 1000   Oral care mouth rinse  15 mL Mouth Rinse PRN Maczis, Michael M, PA-C       oxyCODONE (Oxy IR/ROXICODONE) immediate release tablet 2.5-5 mg  2.5-5 mg Oral Q4H PRN Maczis, Michael M, PA-C   5 mg at 12/28/23 1405   pantoprazole (PROTONIX) injection 40 mg  40 mg Intravenous Q12H Maczis, Michael M, PA-C   40 mg at 12/29/23 1000   piperacillin-tazobactam (ZOSYN) IVPB 2.25 g  2.25  g Intravenous Q8H Maczis, Michael M, PA-C 100 mL/hr at 12/29/23 1312 2.25 g at 12/29/23 1312   polyethylene glycol (MIRALAX / GLYCOLAX) packet 17 g  17 g Oral Daily PRN Maczis, Michael M, PA-C       thiamine (VITAMIN B1) tablet 100 mg  100 mg Oral Daily Maczis, Michael M, PA-C   100 mg at 12/29/23 1000     Discharge Medications: Please see discharge summary for a list of discharge medications.  Relevant Imaging Results:  Relevant Lab Results:   Additional Information [id number]  Carmon Christen, LCSWA

## 2023-12-29 NOTE — Progress Notes (Signed)
 BLE venous duplex has been completed.   Results can be found under chart review under CV PROC. 12/29/2023 3:03 PM Ladeja Pelham RVT, RDMS

## 2023-12-29 NOTE — Evaluation (Signed)
 Physical Therapy Evaluation Patient Details Name: Jorge Martin MRN: [id number] DOB: 1935-08-14 Today's Date: 12/29/2023  History of Present Illness  89 y/o M who presented to the ED 4/11 with decreased PO intake and lethargy.Found to have dilation of his CBD on imaging and elevation of his bilirubin suggestive of acutecholecystitis/significantly abnormal LFTs. Also found to have new onset A-fib and RVR  S/p 4/13 Laparoscopic cholecystectomy with intra-operative cholangiogram  PMH of HTN and early Alzheimer's, HTN, OSA, Prostate Ca, CKD 3b  who has a history of ETOH abuse.  Clinical Impression  PTA pt living alone in single story home with level entry. Pt reports independence in ambulation community distances, independent with ADLs and iADL, and driving. Pt is currently limited by being cold, with high amplitude tremors/shivers (pt reports low amplitude tremors at baseline), in presence of decreased strength, balance and endurance. Pt is currently totalA for bed mobility. Patient will benefit from continued inpatient follow up therapy, <3 hours/day. PT will continue to follow acutely.          If plan is discharge home, recommend the following: Two people to help with walking and/or transfers;Two people to help with bathing/dressing/bathroom;Assistance with cooking/housework;Assistance with feeding;Direct supervision/assist for medications management;Direct supervision/assist for financial management;Assist for transportation   Can travel by private vehicle   No    Equipment Recommendations BSC/3in1     Functional Status Assessment Patient has had a recent decline in their functional status and demonstrates the ability to make significant improvements in function in a reasonable and predictable amount of time.     Precautions / Restrictions Precautions Precautions: Fall Restrictions Weight Bearing Restrictions Per Provider Order: No      Mobility  Bed Mobility Overal bed mobility:  Needs Assistance Bed Mobility: Supine to Sit, Sit to Supine     Supine to sit: Total assist Sit to supine: Total assist, HOB elevated   General bed mobility comments: pt reports being cold prior to removal of blankets for mobilization, once blankets removed pt with increased high amplitude tremors, unable to reach and hold on bed rail, pt reports tremors at baseline, but admits that these are much worse. Pt requiring total A for LE off bed and trunk to upright. Pt tolerates <1 min of sitting EoB before stating he is too cold and wants to return to bed able to scoot hips towards HoB before laying down and requiring total A for returning LE to bed    Transfers Overall transfer level: Needs assistance   Transfers: Bed to chair/wheelchair/BSC            Lateral/Scoot Transfers: Mod assist General transfer comment: modA for 2x lateral scoot of hips towards HoB          Balance Overall balance assessment: Needs assistance Sitting-balance support: Feet supported, No upper extremity supported Sitting balance-Leahy Scale: Fair                                       Pertinent Vitals/Pain Pain Assessment Pain Assessment: Faces Faces Pain Scale: Hurts even more Pain Location: "everywhere" Pain Descriptors / Indicators: Grimacing, Guarding, Discomfort Pain Intervention(s): Limited activity within patient's tolerance, Monitored during session, Repositioned    Home Living Family/patient expects to be discharged to:: Private residence Living Arrangements: Alone Available Help at Discharge: Family;Available 24 hours/day Type of Home: House Home Access: Level entry       Home Layout: One  level Home Equipment: Agricultural consultant (2 wheels);Shower seat      Prior Function Prior Level of Function : Independent/Modified Independent             Mobility Comments: reports independence with ambulation, still drives ADLs Comments: independent with ADLs, and iADLs      Extremity/Trunk Assessment   Upper Extremity Assessment Upper Extremity Assessment: Defer to OT evaluation    Lower Extremity Assessment Lower Extremity Assessment: RLE deficits/detail;LLE deficits/detail RLE Deficits / Details: ROM WFL, strength grossly 3+/5 from mobility, does not tolerate sitting EoB for formal assessment LLE Deficits / Details: ROM WFL, strength grossly 3+/5 from mobility, does not tolerate sitting EoB for formal assessment    Cervical / Trunk Assessment Cervical / Trunk Assessment: Kyphotic  Communication   Communication Communication: No apparent difficulties    Cognition Arousal: Lethargic Behavior During Therapy: Flat affect   PT - Cognitive impairments: History of cognitive impairments                         Following commands: Intact       Cueing Cueing Techniques: Verbal cues, Tactile cues, Visual cues     General Comments General comments (skin integrity, edema, etc.): Very cold during session limiting activity, VSS on RA        Assessment/Plan    PT Assessment Patient needs continued PT services  PT Problem List Decreased strength;Decreased activity tolerance;Decreased balance;Decreased mobility;Decreased coordination;Pain       PT Treatment Interventions DME instruction;Gait training;Functional mobility training;Therapeutic activities;Therapeutic exercise;Balance training;Cognitive remediation;Patient/family education    PT Goals (Current goals can be found in the Care Plan section)  Acute Rehab PT Goals PT Goal Formulation: With patient Time For Goal Achievement: 01/12/24 Potential to Achieve Goals: Good    Frequency Min 2X/week        AM-PAC PT "6 Clicks" Mobility  Outcome Measure Help needed turning from your back to your side while in a flat bed without using bedrails?: A Little Help needed moving from lying on your back to sitting on the side of a flat bed without using bedrails?: Total Help needed moving  to and from a bed to a chair (including a wheelchair)?: Total Help needed standing up from a chair using your arms (e.g., wheelchair or bedside chair)?: Total Help needed to walk in hospital room?: Total Help needed climbing 3-5 steps with a railing? : Total 6 Click Score: 8    End of Session   Activity Tolerance: Other (comment);Patient limited by fatigue (very cold) Patient left: in bed;with call bell/phone within reach;with bed alarm set;Other (comment) (warm blankets in place) Nurse Communication: Mobility status PT Visit Diagnosis: Difficulty in walking, not elsewhere classified (R26.2);Muscle weakness (generalized) (M62.81)    Time: [id number] PT Time Calculation (min) (ACUTE ONLY): 18 min   Charges:   PT Evaluation $PT Eval Moderate Complexity: 1 Mod PT Treatments $Therapeutic Activity: 8-22 mins PT General Charges $$ ACUTE PT VISIT: 1 Visit         Jorge Martin B. Jorge Martin PT, DPT Acute Rehabilitation Services Please use secure chat or  Call Office [phone]   Jorge Martin [facility] 12/29/2023, 11:35 AM

## 2023-12-30 DIAGNOSIS — I480 Paroxysmal atrial fibrillation: Secondary | ICD-10-CM | POA: Diagnosis not present

## 2023-12-30 DIAGNOSIS — D649 Anemia, unspecified: Secondary | ICD-10-CM | POA: Diagnosis not present

## 2023-12-30 DIAGNOSIS — N179 Acute kidney failure, unspecified: Secondary | ICD-10-CM | POA: Diagnosis not present

## 2023-12-30 DIAGNOSIS — R7989 Other specified abnormal findings of blood chemistry: Secondary | ICD-10-CM | POA: Diagnosis not present

## 2023-12-30 LAB — RETICULOCYTES
Immature Retic Fract: 21.2 % — ABNORMAL HIGH (ref 2.3–15.9)
RBC.: 2.74 MIL/uL — ABNORMAL LOW (ref 4.22–5.81)
Retic Count, Absolute: 34 10*3/uL (ref 19.0–186.0)
Retic Ct Pct: 1.2 % (ref 0.4–3.1)

## 2023-12-30 LAB — COMPREHENSIVE METABOLIC PANEL WITH GFR
ALT: 597 U/L — ABNORMAL HIGH (ref 0–44)
AST: 254 U/L — ABNORMAL HIGH (ref 15–41)
Albumin: 2.6 g/dL — ABNORMAL LOW (ref 3.5–5.0)
Alkaline Phosphatase: 109 U/L (ref 38–126)
Anion gap: 14 (ref 5–15)
BUN: 50 mg/dL — ABNORMAL HIGH (ref 8–23)
CO2: 25 mmol/L (ref 22–32)
Calcium: 7.9 mg/dL — ABNORMAL LOW (ref 8.9–10.3)
Chloride: 99 mmol/L (ref 98–111)
Creatinine, Ser: 2.53 mg/dL — ABNORMAL HIGH (ref 0.61–1.24)
GFR, Estimated: 24 mL/min — ABNORMAL LOW (ref >60)
Glucose, Bld: 119 mg/dL — ABNORMAL HIGH (ref 70–99)
Potassium: 3.9 mmol/L (ref 3.5–5.1)
Sodium: 138 mmol/L (ref 135–145)
Total Bilirubin: 1.8 mg/dL — ABNORMAL HIGH (ref 0.0–1.2)
Total Protein: 5.5 g/dL — ABNORMAL LOW (ref 6.5–8.1)

## 2023-12-30 LAB — CBC
HCT: 24.2 % — ABNORMAL LOW (ref 39.0–52.0)
Hemoglobin: 8.5 g/dL — ABNORMAL LOW (ref 13.0–17.0)
MCH: 31 pg (ref 26.0–34.0)
MCHC: 35.1 g/dL (ref 30.0–36.0)
MCV: 88.3 fL (ref 80.0–100.0)
Platelets: 203 10*3/uL (ref 150–400)
RBC: 2.74 MIL/uL — ABNORMAL LOW (ref 4.22–5.81)
RDW: 18.5 % — ABNORMAL HIGH (ref 11.5–15.5)
WBC: 5.5 10*3/uL (ref 4.0–10.5)
nRBC: 0.5 % — ABNORMAL HIGH (ref 0.0–0.2)

## 2023-12-30 LAB — PROTIME-INR
INR: 1.1 (ref 0.8–1.2)
Prothrombin Time: 14.2 s (ref 11.4–15.2)

## 2023-12-30 LAB — IRON AND TIBC
Iron: 43 ug/dL — ABNORMAL LOW (ref 45–182)
Saturation Ratios: 34 % (ref 17.9–39.5)
TIBC: 127 ug/dL — ABNORMAL LOW (ref 250–450)
UIBC: 84 ug/dL

## 2023-12-30 LAB — VITAMIN B12: Vitamin B-12: 2329 pg/mL — ABNORMAL HIGH (ref 180–914)

## 2023-12-30 LAB — SURGICAL PATHOLOGY

## 2023-12-30 LAB — FERRITIN: Ferritin: 2088 ng/mL — ABNORMAL HIGH (ref 24–336)

## 2023-12-30 LAB — FOLATE: Folate: 9.9 ng/mL (ref >5.9)

## 2023-12-30 MED ORDER — ENSURE ENLIVE PO LIQD
237.0000 mL | Freq: Three times a day (TID) | ORAL | Status: DC
  Administered 2023-12-30 – 2023-12-31 (×3): 237 mL via ORAL

## 2023-12-30 MED ORDER — SODIUM CHLORIDE 0.45 % IV SOLN: INTRAVENOUS | Status: DC

## 2023-12-30 NOTE — Progress Notes (Signed)
 [facility] PROGRESS NOTE   Jorge Martin UEA:[id number] DOB: 19-May-1935 DOA: 12/26/2023  PCP: Pcp, No  Brief History:  89 y.o. M with PMH of HTN and early Alzheimer's, HTN, OSA, Prostate Ca, CKD 3b  who has a history of ETOH abuse.  Apparently he had quit drinking alcohol for some time but then picked it up again couple of months prior to this admission.  He then quit again cold [place] 7 to 8 days prior to admission.  Subsequently he lost his appetite and started having nausea and vomiting.  He was found to have atrial fibrillation with RVR.  There was also concern for cholecystitis.  He was hospitalized for further management.    Consultants: General Surgery.  Critical care medicine.  Procedures: Laparoscopic cholecystectomy with intraoperative cholangiogram    Subjective/Interval History: Patient mentions that abdominal pain is well-controlled.  Denies any nausea vomiting.  Still feels quite weak and fatigued.    Assessment/Plan:  Septic shock Most likely secondary to cholecystitis.  Did not require vasopressors but he was in the ICU. Blood pressure has stabilized.  Continue to monitor.  Acute cholecystitis/significantly abnormal LFTs Found to have abnormal LFTs with elevated AST ALT bilirubin.  Alkaline phosphatase normal. Underwent multiple studies including ultrasound as well as HIDA scan which raise concern for cholecystitis. Patient was seen by general surgery and underwent laparoscopic cholecystectomy on 4/13. LFTs are noted to be improving with the significant improvement in AST and ALT.  Bilirubin remains elevated.  Intraoperative cholangiogram was negative. Patient remains on Zosyn. Diet has been advanced per general surgery. Antibiotics per general surgery.  WBC is noted to be normal.  New onset atrial fibrillation with RVR Was treated with amiodarone.  Back to sinus rhythm. Likely triggered by acute illness.  Atrial fibrillation was for very short  duration.  Not deemed to require anticoagulation. Continue amiodarone 200 mg twice a day for 1 week and then change to 200 mg once a day for 2 weeks. Not on any other rate limiting drugs.  Acute kidney injury superimposed on chronic kidney disease stage IIIb/hypokalemia Creatinine in 2023 was 1.63. Came in with creatinine of 3.18.  No hydronephrosis noted on CT scan. Patient treated for infection and given IV fluids.   Hemoglobin did improve but appears to have plateaued.  He does report poor appetite which could be contributing.  He is not on any nephrotoxic agents currently.  He has noted to be on Zosyn. Weight is noted to be stable.  Since his oral intake is poor could gently hydrate him to see if there is any response.  Repeat UA. Noted to have foley catheter. Can attempt voiding trial when mobility has improved.  Started on Flomax.  Normocytic anemia Significant drop in hemoglobin noted.  Hemoglobin was 13.5 at admission.  It went down to 9.8 and then 8.8.  For some reason no hemoglobin checked this morning. No overt bleeding noted. Anemia panel shows ferritin of 2088, iron of 43, TIBC 127, percent saturation 34.  B12 level 2329 and folic acid 9.9. Hemoglobin was 11.6 in 2023.  Initial values during this admission might have been hemoconcentrated.  Likely a dilutional drop.  Will recheck hemoglobin tomorrow morning.    RV dilatation on echocardiogram No previous echoes available for comparison.  LVEF was normal.  RV function was normal. May benefit from outpatient cardiology evaluation.  Acute respiratory failure with hypoxia Seems to have resolved.  Has been weaned off of oxygen.  History of alcoholism Not  in withdrawal.  Continue with thiamine multivitamins.  History of dementia Continue with Aricept.  History of essential hypertension Prior to admission he was on amlodipine and losartan which are currently on hold.  Monitor blood pressures closely.  Hyperlipidemia On  Lipitor which is currently on hold.  DVT Prophylaxis: SCDs Code Status: DNR Family Communication: No family at bedside Disposition Plan: SNF when medically stable     Medications: Scheduled:  acetaminophen  1,000 mg Oral Q6H   amiodarone  200 mg Oral BID   Chlorhexidine Gluconate Cloth  6 each Topical Daily   donepezil  10 mg Oral QHS   multivitamin with minerals  1 tablet Oral Daily   pantoprazole (PROTONIX) IV  40 mg Intravenous Q12H   tamsulosin  0.4 mg Oral QPC supper   thiamine  100 mg Oral Daily   Continuous:  piperacillin-tazobactam (ZOSYN)  IV 2.25 g (12/30/23 0535)   ZOX:WRUEAVWU sodium, methocarbamol, mouth rinse, oxyCODONE, polyethylene glycol  Antibiotics: Anti-infectives (From admission, onward)    Start     Dose/Rate Route Frequency Ordered Stop   12/27/23 1445  piperacillin-tazobactam (ZOSYN) IVPB 2.25 g        2.25 g 100 mL/hr over 30 Minutes Intravenous Every 8 hours 12/27/23 1357     12/27/23 1100  ceFEPIme (MAXIPIME) 2 g in sodium chloride 0.9 % 100 mL IVPB  Status:  Discontinued        2 g 200 mL/hr over 30 Minutes Intravenous Every 24 hours 12/26/23 1704 12/27/23 1357   12/27/23 0100  metroNIDAZOLE (FLAGYL) IVPB 500 mg  Status:  Discontinued        500 mg 100 mL/hr over 60 Minutes Intravenous Every 12 hours 12/26/23 1703 12/27/23 1357   12/26/23 2200  metroNIDAZOLE (FLAGYL) IVPB 500 mg  Status:  Discontinued        500 mg 100 mL/hr over 60 Minutes Intravenous Every 12 hours 12/26/23 1654 12/26/23 1703   12/26/23 1704  vancomycin variable dose per unstable renal function (pharmacist dosing)  Status:  Discontinued         Does not apply See admin instructions 12/26/23 1704 12/27/23 1348   12/26/23 1015  ceFEPIme (MAXIPIME) 2 g in sodium chloride 0.9 % 100 mL IVPB        2 g 200 mL/hr over 30 Minutes Intravenous  Once 12/26/23 1001 12/26/23 1300   12/26/23 1015  metroNIDAZOLE (FLAGYL) IVPB 500 mg        500 mg 100 mL/hr over 60 Minutes Intravenous   Once 12/26/23 1001 12/26/23 1501   12/26/23 1015  vancomycin (VANCOCIN) IVPB 1000 mg/200 mL premix        1,000 mg 200 mL/hr over 60 Minutes Intravenous  Once 12/26/23 1001 12/26/23 1618       Objective:  Vital Signs  Vitals:   12/29/23 1623 12/29/23 1941 12/30/23 0500 12/30/23 0521  BP: 121/70 123/74  (!) 155/87  Pulse: (!) 53 60  68  Resp: 16 17  18   Temp: 97.9 F (36.6 C) 98.3 F (36.8 C)  98 F (36.7 C)  TempSrc: Oral Oral  Oral  SpO2: 99% 98%  96%  Weight:   70.8 kg   Height:        Intake/Output Summary (Last 24 hours) at 12/30/2023 0928 Last data filed at 12/30/2023 0526 Gross per 24 hour  Intake 720 ml  Output 550 ml  Net 170 ml   Filed Weights   12/28/23 0500 12/29/23 0500 12/30/23 0500  Weight: 71  kg 69.8 kg 70.8 kg    General appearance: Awake alert.  In no distress Resp: Clear to auscultation bilaterally.  Normal effort Cardio: S1-S2 is normal regular.  No S3-S4.  No rubs murmurs or bruit GI: Abdomen is soft.  Nontender nondistended.  Bowel sounds are present normal.  No masses organomegaly Extremities: Physical deconditioning noted. No obvious focal neurological deficits.  Lab Results:  Data Reviewed: I have personally reviewed following labs and reports of the imaging studies  CBC: Recent Labs  Lab 12/26/23 0900 12/26/23 0923 12/28/23 0414 12/29/23 0427  WBC 8.6  --  4.7 4.9  NEUTROABS 6.8  --   --   --   HGB 13.5 14.6 9.8* 8.8*  HCT 43.2 43.0 29.1* 25.3*  MCV 98.0  --  91.2 88.2  PLT 189  --  151 165    Basic Metabolic Panel: Recent Labs  Lab 12/26/23 1642 12/27/23 1153 12/28/23 0414 12/29/23 0427 12/30/23 0508  NA 139 139 137 138 138  K 3.9 3.6 2.9* 4.0 3.9  CL 105 102 96* 99 99  CO2 14* 14* 25 26 25   GLUCOSE 163* 110* 95 154* 119*  BUN 36* 49* 54* 53* 50*  CREATININE 2.80* 2.73* 2.77* 2.59* 2.53*  CALCIUM 8.3* 8.2* 7.4* 7.8* 7.9*  MG 1.1*  --   --   --   --     GFR: Estimated Creatinine Clearance: 20.6 mL/min (A) (by  C-G formula based on SCr of 2.53 mg/dL (H)).  Liver Function Tests: Recent Labs  Lab 12/26/23 1642 12/27/23 1153 12/28/23 0414 12/29/23 0427 12/30/23 0508  AST 1,866* 2,319* 1,528* 576* 254*  ALT 1,076* 1,525* 1,258* 792* 597*  ALKPHOS 62 70 82 92 109  BILITOT 2.5* 2.2* 2.4* 2.4* 1.8*  PROT 5.4* 5.5* 4.9* 5.2* 5.5*  ALBUMIN 2.6* 2.5* 2.2* 2.6* 2.6*    Recent Labs  Lab 12/26/23 0900  LIPASE 76*   Recent Labs  Lab 12/26/23 0933  AMMONIA 35    Coagulation Profile: Recent Labs  Lab 12/26/23 1642 12/27/23 1153 12/28/23 0414 12/29/23 0427 12/30/23 0508  INR 1.7* 1.4* 1.4* 1.3* 1.1    Cardiac Enzymes: Recent Labs  Lab 12/26/23 0900  CKTOTAL 32*   HbA1C: Recent Labs    12/28/23 0414  HGBA1C 5.6    CBG: Recent Labs  Lab 12/26/23 1123 12/26/23 1623  GLUCAP 116* 153*     Recent Results (from the past 240 hours)  Resp panel by RT-PCR (RSV, Flu A&B, Covid) Urine, Clean Catch     Status: None   Collection Time: 12/26/23 10:00 AM   Specimen: Urine, Clean Catch; Nasal Swab  Result Value Ref Range Status   SARS Coronavirus 2 by RT PCR NEGATIVE NEGATIVE Final   Influenza A by PCR NEGATIVE NEGATIVE Final   Influenza B by PCR NEGATIVE NEGATIVE Final    Comment: (NOTE) The Xpert Xpress SARS-CoV-2/FLU/RSV plus assay is intended as an aid in the diagnosis of influenza from Nasopharyngeal swab specimens and should not be used as a sole basis for treatment. Nasal washings and aspirates are unacceptable for Xpert Xpress SARS-CoV-2/FLU/RSV testing.  Fact Sheet for Patients: BloggerCourse.com  Fact Sheet for Healthcare Providers: SeriousBroker.it  This test is not yet approved or cleared by the [place] FDA and has been authorized for detection and/or diagnosis of SARS-CoV-2 by FDA under an Emergency Use Authorization (EUA). This EUA will remain in effect (meaning this test can be used) for the duration of  the COVID-19 declaration under  Section 564(b)(1) of the Act, 21 U.S.C. section 360bbb-3(b)(1), unless the authorization is terminated or revoked.     Resp Syncytial Virus by PCR NEGATIVE NEGATIVE Final    Comment: (NOTE) Fact Sheet for Patients: BloggerCourse.com  Fact Sheet for Healthcare Providers: SeriousBroker.it  This test is not yet approved or cleared by the [place] FDA and has been authorized for detection and/or diagnosis of SARS-CoV-2 by FDA under an Emergency Use Authorization (EUA). This EUA will remain in effect (meaning this test can be used) for the duration of the COVID-19 declaration under Section 564(b)(1) of the Act, 21 U.S.C. section 360bbb-3(b)(1), unless the authorization is terminated or revoked.  Performed at [facility], [address] [place], [place] [address]   Blood Culture (routine x 2)     Status: None (Preliminary result)   Collection Time: 12/26/23 10:01 AM   Specimen: BLOOD LEFT HAND  Result Value Ref Range Status   Specimen Description BLOOD LEFT HAND  Final   Special Requests   Final    AEROBIC BOTTLE ONLY Blood Culture results may not be optimal due to an inadequate volume of blood received in culture bottles   Culture   Final    NO GROWTH 4 DAYS Performed at [facility], [address], [place], [place] [address]    Report Status PENDING  Incomplete  Blood Culture (routine x 2)     Status: None (Preliminary result)   Collection Time: 12/26/23 10:06 AM   Specimen: BLOOD LEFT HAND  Result Value Ref Range Status   Specimen Description BLOOD LEFT HAND  Final   Special Requests   Final    AEROBIC BOTTLE ONLY Blood Culture results may not be optimal due to an inadequate volume of blood received in culture bottles   Culture   Final    NO GROWTH 4 DAYS Performed at [facility], [address] [place], [place] [address]    Report Status PENDING  Incomplete  MRSA  Next Gen by PCR, Nasal     Status: None   Collection Time: 12/26/23  4:43 PM   Specimen: Nasal Mucosa; Nasal Swab  Result Value Ref Range Status   MRSA by PCR Next Gen NOT DETECTED NOT DETECTED Final    Comment: (NOTE) The GeneXpert MRSA Assay (FDA approved for NASAL specimens only), is one component of a comprehensive MRSA colonization surveillance program. It is not intended to diagnose MRSA infection nor to guide or monitor treatment for MRSA infections. Test performance is not FDA approved in patients less than 49 years old. Performed at [facility], [address] [place], [place] [address]       Radiology Studies: VAS [place] LOWER EXTREMITY VENOUS (DVT) Result Date: 12/29/2023  Lower Venous DVT Study Patient Name:  LANARD ARGUIJO  Date of Exam:   12/29/2023 Medical Rec #: [id number]         Accession #:    [id number] Date of Birth: 1935-07-16         Patient Gender: M Patient Age:   57 years Exam Location:  [facility] Procedure:      VAS [place] LOWER EXTREMITY VENOUS (DVT) Referring Phys: MICHAEL MACZIS --------------------------------------------------------------------------------  Indications: Edema.  Comparison Study: No previous exams Performing Technologist: Jody Hill RVT, RDMS  Examination Guidelines: A complete evaluation includes B-mode imaging, spectral Doppler, color Doppler, and power Doppler as needed of all accessible portions of each vessel. Bilateral testing is considered an integral  part of a complete examination. Limited examinations for reoccurring indications may be performed as noted. The reflux portion of the exam is performed with the patient in reverse Trendelenburg.  +---------+---------------+---------+-----------+----------+--------------+ RIGHT    CompressibilityPhasicitySpontaneityPropertiesThrombus Aging +---------+---------------+---------+-----------+----------+--------------+ CFV      Full           Yes      Yes                                  +---------+---------------+---------+-----------+----------+--------------+ SFJ      Full                                                        +---------+---------------+---------+-----------+----------+--------------+ FV Prox  Full           Yes      Yes                                 +---------+---------------+---------+-----------+----------+--------------+ FV Mid   Full           Yes      Yes                                 +---------+---------------+---------+-----------+----------+--------------+ FV DistalFull           Yes      Yes                                 +---------+---------------+---------+-----------+----------+--------------+ PFV      Full                                                        +---------+---------------+---------+-----------+----------+--------------+ POP      Full           Yes      Yes                                 +---------+---------------+---------+-----------+----------+--------------+ PTV      Full                                                        +---------+---------------+---------+-----------+----------+--------------+ PERO     Full                                                        +---------+---------------+---------+-----------+----------+--------------+   +---------+---------------+---------+-----------+----------+--------------+ LEFT     CompressibilityPhasicitySpontaneityPropertiesThrombus Aging +---------+---------------+---------+-----------+----------+--------------+ CFV      Full           Yes      Yes                                 +---------+---------------+---------+-----------+----------+--------------+  SFJ      Full                                                        +---------+---------------+---------+-----------+----------+--------------+ FV Prox  Full           Yes      Yes                                  +---------+---------------+---------+-----------+----------+--------------+ FV Mid   Full           Yes      Yes                                 +---------+---------------+---------+-----------+----------+--------------+ FV DistalFull           Yes      Yes                                 +---------+---------------+---------+-----------+----------+--------------+ PFV      Full                                                        +---------+---------------+---------+-----------+----------+--------------+ POP      Full           Yes      Yes                                 +---------+---------------+---------+-----------+----------+--------------+ PTV      Full                                                        +---------+---------------+---------+-----------+----------+--------------+ PERO     Full                                                        +---------+---------------+---------+-----------+----------+--------------+     Summary: BILATERAL: - No evidence of deep vein thrombosis seen in the lower extremities, bilaterally. -No evidence of popliteal cyst, bilaterally.   *See table(s) above for measurements and observations. Electronically signed by Jimmye Moulds MD on 12/29/2023 at 3:05:29 PM.    Final    DG Cholangiogram Operative Result Date: 12/28/2023 CLINICAL DATA:  Laparoscopic cholecystectomy. EXAM: INTRAOPERATIVE CHOLANGIOGRAM TECHNIQUE: Cholangiographic images from the C-arm fluoroscopic device were submitted for interpretation post-operatively. Please see the procedural report for the amount of contrast and the fluoroscopy time utilized. FLUOROSCOPY: Radiation Exposure Index (as provided by the fluoroscopic device): 3.2 mGy Kerma COMPARISON:  None Available. FINDINGS: 2 spot fluoro runs obtained during intraoperative cholangiography are provided. Initial run shows cannulation of the cystic duct  with good opacification of the intrahepatic and  proximal extrahepatic biliary system. No filling defect or contrast extravasation. 2nd series shows intrahepatic biliary ducts as well as the extrahepatic common duct and common bile duct to the level of the ampulla. There is no filling defect in the common bile duct to suggest choledocholithiasis. Contrast material flows freely through the ampulla into the lumen of the duodenum. No contrast extravasation. IMPRESSION: Negative intraoperative cholangiogram. Electronically Signed   By: Donnal Fusi M.D.   On: 12/28/2023 12:54       LOS: 4 days   Jorge Martin  [facility] Pager on [email or web address]  12/30/2023, 9:28 AM

## 2023-12-30 NOTE — TOC Progression Note (Addendum)
 Transition of Care [facility]) - Progression Note    Patient Details  Name: Jorge Martin MRN: [id number] Date of Birth: 05-28-1935  Transition of Care [facility]) CM/SW Contact  Carmon Christen, LCSWA Phone Number: 12/30/2023, 1:05 PM  Clinical Narrative:     Patient has SNF bed at [facility] when medically ready for dc. CSW offered patient outpatient substances use treatment services resources. Patient accepted. All questions answered. No further questions reported at this time.CSW will continue to follow and assist with patients dc planning needs.  Expected Discharge Plan: Skilled Nursing Facility Barriers to Discharge: Continued Medical Work up  Expected Discharge Plan and Services In-house Referral: Clinical Social Work     Living arrangements for the past 2 months: Single Family Home                                       Social Determinants of Health (SDOH) Interventions SDOH Screenings   Food Insecurity: No Food Insecurity (12/26/2023)  Housing: Low Risk  (12/26/2023)  Recent Concern: Housing - High Risk (12/26/2023)  Transportation Needs: No Transportation Needs (12/26/2023)  Utilities: Not At Risk (12/26/2023)  Depression (PHQ2-9): Low Risk  (05/16/2020)  Social Connections: Moderately Integrated (12/26/2023)  Tobacco Use: Low Risk  (12/28/2023)    Readmission Risk Interventions     No data to display

## 2023-12-30 NOTE — Care Management Important Message (Signed)
 Important Message  Patient Details  Name: Jorge Martin MRN: [id number] Date of Birth: July 11, 1935   Important Message Given:  Yes - Medicare IM     [name] 12/30/2023, 9:18 AM

## 2023-12-30 NOTE — Progress Notes (Signed)
 2 Days Post-Op  Subjective: CC: Umbilical abdominal pain that is well controlled and stable from yesterday. Tolerating po but not eating much. No n/v, just reports he does not have an appetite. BM today.   Afebrile. No tachycardia or hypotension. WBC wnl. LFT's overall downtrending with normal alk phos and T. Bili of 1.8   Objective: Vital signs in last 24 hours: Temp:  [97.8 F (36.6 C)-98.3 F (36.8 C)] 98 F (36.7 C) (04/15 0521) Pulse Rate:  [53-68] 65 (04/15 0940) Resp:  [16-18] 18 (04/15 0521) BP: (121-155)/(70-87) 155/87 (04/15 0521) SpO2:  [96 %-99 %] 99 % (04/15 0940) Weight:  [70.8 kg] 70.8 kg (04/15 0500) Last BM Date : 12/30/23 (At moring shift handoff, was told by night RN that LBM was today, Patient stated his LBM was 4/14)  Intake/Output from previous day: 04/14 0701 - 04/15 0700 In: 720 [P.O.:720] Out: 550 [Urine:550] Intake/Output this shift: Total I/O In: -  Out: 150 [Urine:150]  PE: Gen:  Alert, NAD, pleasant Abd: Soft, mild distension, appropriately tender around laparoscopic incisions, no rigidity or guarding and otherwise NT, +BS. Incisions with glue intact appears well and are without drainage, bleeding, or signs of infection.  Lab Results:  Recent Labs    12/29/23 0427 12/30/23 0829  WBC 4.9 5.5  HGB 8.8* 8.5*  HCT 25.3* 24.2*  PLT 165 203   BMET Recent Labs    12/29/23 0427 12/30/23 0508  NA 138 138  K 4.0 3.9  CL 99 99  CO2 26 25  GLUCOSE 154* 119*  BUN 53* 50*  CREATININE 2.59* 2.53*  CALCIUM 7.8* 7.9*   PT/INR Recent Labs    12/29/23 0427 12/30/23 0508  LABPROT 16.0* 14.2  INR 1.3* 1.1   CMP     Component Value Date/Time   NA 138 12/30/2023 0508   NA 141 11/13/2020 0928   K 3.9 12/30/2023 0508   CL 99 12/30/2023 0508   CO2 25 12/30/2023 0508   GLUCOSE 119 (H) 12/30/2023 0508   BUN 50 (H) 12/30/2023 0508   BUN 23 11/13/2020 0928   CREATININE 2.53 (H) 12/30/2023 0508   CREATININE 1.27 (H) 08/11/2017 1042    CALCIUM 7.9 (L) 12/30/2023 0508   PROT 5.5 (L) 12/30/2023 0508   PROT 6.9 11/13/2020 0928   ALBUMIN 2.6 (L) 12/30/2023 0508   ALBUMIN 4.5 11/13/2020 0928   AST 254 (H) 12/30/2023 0508   ALT 597 (H) 12/30/2023 0508   ALKPHOS 109 12/30/2023 0508   BILITOT 1.8 (H) 12/30/2023 0508   BILITOT 0.6 11/13/2020 0928   GFRNONAA 24 (L) 12/30/2023 0508   GFRNONAA 53 (L) 08/11/2017 1042   GFRAA 49 (L) 11/05/2019 1540   GFRAA 61 08/11/2017 1042   Lipase     Component Value Date/Time   LIPASE 76 (H) 12/26/2023 0900    Studies/Results: VAS US  LOWER EXTREMITY VENOUS (DVT) Result Date: 12/29/2023  Lower Venous DVT Study Patient Name:  NELLO CORRO  Date of Exam:   12/29/2023 Medical Rec #: [id number]         Accession #:    [id number] Date of Birth: 02-09-1935         Patient Gender: M Patient Age:   33 years Exam Location:  [facility] Procedure:      VAS US  LOWER EXTREMITY VENOUS (DVT) Referring Phys: Geoff Dacanay --------------------------------------------------------------------------------  Indications: Edema.  Comparison Study: No previous exams Performing Technologist: Jody Hill RVT, RDMS  Examination Guidelines: A complete evaluation includes  B-mode imaging, spectral Doppler, color Doppler, and power Doppler as needed of all accessible portions of each vessel. Bilateral testing is considered an integral part of a complete examination. Limited examinations for reoccurring indications may be performed as noted. The reflux portion of the exam is performed with the patient in reverse Trendelenburg.  +---------+---------------+---------+-----------+----------+--------------+ RIGHT    CompressibilityPhasicitySpontaneityPropertiesThrombus Aging +---------+---------------+---------+-----------+----------+--------------+ CFV      Full           Yes      Yes                                 +---------+---------------+---------+-----------+----------+--------------+ SFJ      Full                                                         +---------+---------------+---------+-----------+----------+--------------+ FV Prox  Full           Yes      Yes                                 +---------+---------------+---------+-----------+----------+--------------+ FV Mid   Full           Yes      Yes                                 +---------+---------------+---------+-----------+----------+--------------+ FV DistalFull           Yes      Yes                                 +---------+---------------+---------+-----------+----------+--------------+ PFV      Full                                                        +---------+---------------+---------+-----------+----------+--------------+ POP      Full           Yes      Yes                                 +---------+---------------+---------+-----------+----------+--------------+ PTV      Full                                                        +---------+---------------+---------+-----------+----------+--------------+ PERO     Full                                                        +---------+---------------+---------+-----------+----------+--------------+   +---------+---------------+---------+-----------+----------+--------------+ LEFT     CompressibilityPhasicitySpontaneityPropertiesThrombus Aging +---------+---------------+---------+-----------+----------+--------------+  CFV      Full           Yes      Yes                                 +---------+---------------+---------+-----------+----------+--------------+ SFJ      Full                                                        +---------+---------------+---------+-----------+----------+--------------+ FV Prox  Full           Yes      Yes                                 +---------+---------------+---------+-----------+----------+--------------+ FV Mid   Full           Yes      Yes                                  +---------+---------------+---------+-----------+----------+--------------+ FV DistalFull           Yes      Yes                                 +---------+---------------+---------+-----------+----------+--------------+ PFV      Full                                                        +---------+---------------+---------+-----------+----------+--------------+ POP      Full           Yes      Yes                                 +---------+---------------+---------+-----------+----------+--------------+ PTV      Full                                                        +---------+---------------+---------+-----------+----------+--------------+ PERO     Full                                                        +---------+---------------+---------+-----------+----------+--------------+     Summary: BILATERAL: - No evidence of deep vein thrombosis seen in the lower extremities, bilaterally. -No evidence of popliteal cyst, bilaterally.   *See table(s) above for measurements and observations. Electronically signed by Sherald Hess MD on 12/29/2023 at 3:05:29 PM.    Final     Anti-infectives: Anti-infectives (From admission, onward)    Start     Dose/Rate Route Frequency Ordered Stop  12/27/23 1445  piperacillin-tazobactam (ZOSYN) IVPB 2.25 g        2.25 g 100 mL/hr over 30 Minutes Intravenous Every 8 hours 12/27/23 1357     12/27/23 1100  ceFEPIme (MAXIPIME) 2 g in sodium chloride 0.9 % 100 mL IVPB  Status:  Discontinued        2 g 200 mL/hr over 30 Minutes Intravenous Every 24 hours 12/26/23 1704 12/27/23 1357   12/27/23 0100  metroNIDAZOLE (FLAGYL) IVPB 500 mg  Status:  Discontinued        500 mg 100 mL/hr over 60 Minutes Intravenous Every 12 hours 12/26/23 1703 12/27/23 1357   12/26/23 2200  metroNIDAZOLE (FLAGYL) IVPB 500 mg  Status:  Discontinued        500 mg 100 mL/hr over 60 Minutes Intravenous Every 12 hours 12/26/23 1654 12/26/23 1703   12/26/23  1704  vancomycin variable dose per unstable renal function (pharmacist dosing)  Status:  Discontinued         Does not apply See admin instructions 12/26/23 1704 12/27/23 1348   12/26/23 1015  ceFEPIme (MAXIPIME) 2 g in sodium chloride 0.9 % 100 mL IVPB        2 g 200 mL/hr over 30 Minutes Intravenous  Once 12/26/23 1001 12/26/23 1300   12/26/23 1015  metroNIDAZOLE (FLAGYL) IVPB 500 mg        500 mg 100 mL/hr over 60 Minutes Intravenous  Once 12/26/23 1001 12/26/23 1501   12/26/23 1015  vancomycin (VANCOCIN) IVPB 1000 mg/200 mL premix        1,000 mg 200 mL/hr over 60 Minutes Intravenous  Once 12/26/23 1001 12/26/23 1618        Assessment/Plan POD 2 s/p Laparoscopic cholecystectomy with IOC for Gangrenous cholecystitis by Dr. Davonna Estes on 12/28/23 - IOC neg. LFT's overall trending down. Cont to monitor - Cont abx, 4d post op - Ok for diet - Mobilize, PT - rec SNF - Pulm toilet  - Ok for d/c to SNF. Can switch to PO abx to complete 4d post op abx if d/c before completion.   FEN - HH VTE - SCDs, okay for chem ppx from a general surgery standpoint. If felt to need therapeutic anticoagulation would favor heparin gtt or therapeutic lovenox until ensure hgb stable  ID - Zosyn Foley - In place, okay to d/c from our standpoint - per [facility]   Presented w/ shock with signs of acute kidney and liver dysfunction - out of ICU New onset A. Fib - was in RVR. Converted back tp  sinus  AKI on CKD - cr improving Reported black stools - fecal occult blood negative  EtOH abuse  HTN HLD OSA on CPAP prediabetes PMH Prostate CA s/p radiation therapy, Dr. Jodeane Mulligan. Lorri Rota Headaches GERD   LOS: 4 days    Delton Filbert, [facility] 12/30/2023, 11:04 AM Please see Amion for pager number during day hours 7:00am-4:30pm

## 2023-12-30 NOTE — Progress Notes (Signed)
 Subjective: No complaints.  Feeling better.  Sore at the incision sites.  Objective: Vital signs in last 24 hours: Temp:  [97.9 F (36.6 C)-98.5 F (36.9 C)] 97.9 F (36.6 C) (04/15 1313) Pulse Rate:  [53-73] 72 (04/15 1313) Resp:  [16-18] 18 (04/15 0521) BP: (110-155)/(60-87) 110/60 (04/15 1313) SpO2:  [96 %-99 %] 99 % (04/15 0940) Weight:  [70.8 kg] 70.8 kg (04/15 0500) Last BM Date : 12/30/23 (At moring shift handoff, was told by night RN that LBM was today, Patient stated his LBM was 4/14)  Intake/Output from previous day: 04/14 0701 - 04/15 0700 In: 720 [P.O.:720] Out: 550 [Urine:550] Intake/Output this shift: Total I/O In: 485.7 [I.V.:217.6; IV Piggyback:268.1] Out: 275 [Urine:275]  General appearance: alert and no distress GI: tender at the incision sites  Lab Results: Recent Labs    12/28/23 0414 12/29/23 0427 12/30/23 0829  WBC 4.7 4.9 5.5  HGB 9.8* 8.8* 8.5*  HCT 29.1* 25.3* 24.2*  PLT 151 165 203   BMET Recent Labs    12/28/23 0414 12/29/23 0427 12/30/23 0508  NA 137 138 138  K 2.9* 4.0 3.9  CL 96* 99 99  CO2 25 26 25   GLUCOSE 95 154* 119*  BUN 54* 53* 50*  CREATININE 2.77* 2.59* 2.53*  CALCIUM 7.4* 7.8* 7.9*   LFT Recent Labs    12/30/23 0508  PROT 5.5*  ALBUMIN 2.6*  AST 254*  ALT 597*  ALKPHOS 109  BILITOT 1.8*   PT/INR Recent Labs    12/29/23 0427 12/30/23 0508  LABPROT 16.0* 14.2  INR 1.3* 1.1   Hepatitis Panel No results for input(s): "HEPBSAG", "HCVAB", "HEPAIGM", "HEPBIGM" in the last 72 hours. C-Diff No results for input(s): "CDIFFTOX" in the last 72 hours. Fecal Lactopherrin No results for input(s): "FECLLACTOFRN" in the last 72 hours.  Studies/Results: VAS [place] LOWER EXTREMITY VENOUS (DVT) Result Date: 12/29/2023  Lower Venous DVT Study Patient Name:  Jorge Martin  Date of Exam:   12/29/2023 Medical Rec #: [id number]         Accession #:    [id number] Date of Birth: 16-May-1935         Patient Gender: M Patient Age:    89 years Exam Location:  [facility] Procedure:      VAS [place] LOWER EXTREMITY VENOUS (DVT) Referring Phys: MICHAEL MACZIS --------------------------------------------------------------------------------  Indications: Edema.  Comparison Study: No previous exams Performing Technologist: Jody Hill RVT, RDMS  Examination Guidelines: A complete evaluation includes B-mode imaging, spectral Doppler, color Doppler, and power Doppler as needed of all accessible portions of each vessel. Bilateral testing is considered an integral part of a complete examination. Limited examinations for reoccurring indications may be performed as noted. The reflux portion of the exam is performed with the patient in reverse Trendelenburg.  +---------+---------------+---------+-----------+----------+--------------+ RIGHT    CompressibilityPhasicitySpontaneityPropertiesThrombus Aging +---------+---------------+---------+-----------+----------+--------------+ CFV      Full           Yes      Yes                                 +---------+---------------+---------+-----------+----------+--------------+ SFJ      Full                                                        +---------+---------------+---------+-----------+----------+--------------+  FV Prox  Full           Yes      Yes                                 +---------+---------------+---------+-----------+----------+--------------+ FV Mid   Full           Yes      Yes                                 +---------+---------------+---------+-----------+----------+--------------+ FV DistalFull           Yes      Yes                                 +---------+---------------+---------+-----------+----------+--------------+ PFV      Full                                                        +---------+---------------+---------+-----------+----------+--------------+ POP      Full           Yes      Yes                                  +---------+---------------+---------+-----------+----------+--------------+ PTV      Full                                                        +---------+---------------+---------+-----------+----------+--------------+ PERO     Full                                                        +---------+---------------+---------+-----------+----------+--------------+   +---------+---------------+---------+-----------+----------+--------------+ LEFT     CompressibilityPhasicitySpontaneityPropertiesThrombus Aging +---------+---------------+---------+-----------+----------+--------------+ CFV      Full           Yes      Yes                                 +---------+---------------+---------+-----------+----------+--------------+ SFJ      Full                                                        +---------+---------------+---------+-----------+----------+--------------+ FV Prox  Full           Yes      Yes                                 +---------+---------------+---------+-----------+----------+--------------+ FV Mid   Full  Yes      Yes                                 +---------+---------------+---------+-----------+----------+--------------+ FV DistalFull           Yes      Yes                                 +---------+---------------+---------+-----------+----------+--------------+ PFV      Full                                                        +---------+---------------+---------+-----------+----------+--------------+ POP      Full           Yes      Yes                                 +---------+---------------+---------+-----------+----------+--------------+ PTV      Full                                                        +---------+---------------+---------+-----------+----------+--------------+ PERO     Full                                                         +---------+---------------+---------+-----------+----------+--------------+     Summary: BILATERAL: - No evidence of deep vein thrombosis seen in the lower extremities, bilaterally. -No evidence of popliteal cyst, bilaterally.   *See table(s) above for measurements and observations. Electronically signed by Sherald Hess MD on 12/29/2023 at 3:05:29 PM.    Final     Medications: Scheduled:  acetaminophen  1,000 mg Oral Q6H   amiodarone  200 mg Oral BID   Chlorhexidine Gluconate Cloth  6 each Topical Daily   donepezil  10 mg Oral QHS   feeding supplement  237 mL Oral TID BM   multivitamin with minerals  1 tablet Oral Daily   pantoprazole (PROTONIX) IV  40 mg Intravenous Q12H   tamsulosin  0.4 mg Oral QPC supper   thiamine  100 mg Oral Daily   Continuous:  sodium chloride Stopped (12/30/23 1432)   piperacillin-tazobactam (ZOSYN)  IV 100 mL/hr at 12/30/23 1449    Assessment/Plan: 1) Shock liver. 2) Acute cholecystitis s/p lap chole.   The patient is well clinically.  His liver enzymes continue to improve and his INR normalized.  At this point it is anticipated that his liver enzymes will normalize.  Currently there is no active GI/hepatic issue for management.  Plan: 1) Continue with supportive care. 2) Signing off.  Call with any questions.  LOS: 4 days   Gadiel John D 12/30/2023, 3:29 PM

## 2023-12-30 NOTE — Progress Notes (Addendum)
 Mobility Specialist Progress Note;   12/30/23 0933  Mobility  Activity Stood at bedside  Level of Assistance Total care  Assistive Device None  Activity Response Tolerated fair  Mobility Referral Yes  Mobility visit 1 Mobility  Mobility Specialist Start Time (ACUTE ONLY) 0933  Mobility Specialist Stop Time (ACUTE ONLY) 0941  Mobility Specialist Time Calculation (min) (ACUTE ONLY) 8 min   Pt agreeable to mobility. Student nurse in room cleaning up pt. Required Total A to bring BLE to side of bed, MaxA to bring BUE up as pt was able to help a bit more w/ this. Pt able to tolerate sitting up, required ModA to then stand from bed side as Student nurse changed linens. No c/o when asked. Deferred sitting in chair at time. Pt returned comfortably back to bed with all needs met, alarm on.   Janit Meline Mobility Specialist Please contact via SecureChat or [employer] [phone]

## 2023-12-30 NOTE — Evaluation (Signed)
 Occupational Therapy Evaluation Patient Details Name: Jorge Martin MRN: [id number] DOB: June 22, 1935 Today's Date: 12/30/2023   History of Present Illness   89 yr old male who presented to the ED 4/11/ 25 with decreased by mouth intake and lethargy. Found to have acute cholecystitis.  Also found to have new onset A-fib and RVR . Now s/p laparoscopic cholecystectomy on 12-28-23.   PMH of HTN and early Alzheimer's, HTN, OSA, Prostate CA, CKD 3, ETOH abuse.  He required max assist to scoot to the head of the bed and total assist for lower body dressing/sock management in bed. He declined to attempt supine to sit, as he was just assisted back to bed by nursing after using the [facility]. He further declined to attempt rolling in bed, due to worry about the possibility of loose bowels with increased activity. Per the student nurse present in the room, the pt required at least max assist for transferring into and out of the bed.  He will benefit from further OT services to maximize his independence with self-care tasks and to decrease the risk for further weakness. Patient will benefit from continued inpatient follow up therapy, <3 hours/day.      Clinical Impressions The pt is currently presenting significantly below his baseline level of functioning for self-care management, as he is limited by the below listed deficits (see OT problem list). During the session today, he was noted to be with deconditioning, and he reported feeling "terrible" when describing his current strength.      If plan is discharge home, recommend the following:   A lot of help with walking and/or transfers;A lot of help with bathing/dressing/bathroom;Help with stairs or ramp for entrance;Assistance with cooking/housework     Functional Status Assessment   Patient has had a recent decline in their functional status and demonstrates the ability to make significant improvements in function in a reasonable and predictable amount of  time.     Equipment Recommendations   Other (comment) (defer to next level of care)     Recommendations for Other Services         Precautions/Restrictions   Precautions Precautions: Fall Restrictions Weight Bearing Restrictions Per Provider Order: No     Mobility Bed Mobility Overal bed mobility: Needs Assistance Bed Mobility:  (Scooting to the head of the bed)           General bed mobility comments: He required max assist to scoot to the head of the bed. He declined to attempt supine to sit, as he was just assisted back to bed by nursing after using the [facility]. He further declined to attempt rolling in bed, due to worry about the possibility of loose bowels with increased activity. Per the student nurse present in the room, the pt required at least max assist for transferring into and out of the bed.          ADL either performed or assessed with clinical judgement   ADL Overall ADL's : Needs assistance/impaired Eating/Feeding: Independent;Bed level   Grooming: Set up;Supervision/safety;Bed level           Upper Body Dressing : Minimal assistance;Bed level   Lower Body Dressing: Total assistance;Bed level Lower Body Dressing Details (indicate cue type and reason): for sock management     Toileting- Clothing Manipulation and Hygiene: Maximal assistance;Bed level Toileting - Clothing Manipulation Details (indicate cue type and reason): based on clinical judgement             Vision  Additional Comments: He correctly read the time depicted on the wall clock.            Pertinent Vitals/Pain Pain Assessment Pain Assessment: No/denies pain     Extremity/Trunk Assessment Upper Extremity Assessment Upper Extremity Assessment: Generalized weakness;Right hand dominant (B UE AROM WFL. B UE grip strength 4-/5)   Lower Extremity Assessment Lower Extremity Assessment:  (B LE AROM WFL. Generalized weakness, based on informal observation of  abilities)       Communication Communication Communication: No apparent difficulties   Cognition Arousal: Alert Behavior During Therapy: Flat affect               OT - Cognition Comments: Oriented to person, place, and month. Unable to recall the year.                 Following commands: Intact for 1 step commands                   Home Living Family/patient expects to be discharged to:: Private residence Living Arrangements: Alone Available Help at Discharge: Family Type of Home: House Home Access: Stairs to enter Secretary/administrator of Steps: 3   Home Layout: One level     Bathroom Shower/Tub: Tub/shower unit         Home Equipment: Tub bench          Prior Functioning/Environment Prior Level of Function : Independent/Modified Independent             Mobility Comments: Independent with ambulation. ADLs Comments: He was independent with ADLs, driving, cooking, and cleaning.    OT Problem List: Decreased strength;Decreased activity tolerance;Impaired balance (sitting and/or standing);Decreased knowledge of use of DME or AE   OT Treatment/Interventions: Self-care/ADL training;Therapeutic exercise;Therapeutic activities;Energy conservation;Patient/family education;DME and/or AE instruction;Balance training      OT Goals(Current goals can be found in the care plan section)   Acute Rehab OT Goals Patient Stated Goal: to do everything he was previously able to do OT Goal Formulation: With patient Time For Goal Achievement: 01/13/24 Potential to Achieve Goals: Good ADL Goals Pt Will Perform Upper Body Dressing: sitting;with set-up;with supervision Pt Will Transfer to Toilet: with contact guard assist;stand pivot transfer;bedside commode Pt Will Perform Toileting - Clothing Manipulation and hygiene: with contact guard assist;sit to/from stand Additional ADL Goal #1: The pt will perform bed mobility with CGA, in prep for progressive ADL  participation.   OT Frequency:  Min 2X/week       AM-PAC OT "6 Clicks" Daily Activity     Outcome Measure Help from another person eating meals?: None Help from another person taking care of personal grooming?: A Little Help from another person toileting, which includes using toliet, bedpan, or urinal?: A Lot Help from another person bathing (including washing, rinsing, drying)?: A Lot Help from another person to put on and taking off regular upper body clothing?: A Little Help from another person to put on and taking off regular lower body clothing?: Total 6 Click Score: 15   End of Session Equipment Utilized During Treatment: Other (comment) (N/A) Nurse Communication: Mobility status  Activity Tolerance: Other (comment) (Fair tolerance) Patient left: in bed;with call bell/phone within reach;with bed alarm set;with family/visitor present  OT Visit Diagnosis: Muscle weakness (generalized) (M62.81);Unsteadiness on feet (R26.81)                Time: 1610-9604 OT Time Calculation (min): 15 min Charges:  OT General Charges $OT Visit: 1 Visit OT Evaluation $OT  Eval Moderate Complexity: 1 Mod   Saraia Platner L Nabilah Davoli, OTR/L 12/30/2023, 1:26 PM

## 2023-12-31 DIAGNOSIS — N1832 Chronic kidney disease, stage 3b: Secondary | ICD-10-CM | POA: Insufficient documentation

## 2023-12-31 DIAGNOSIS — K810 Acute cholecystitis: Secondary | ICD-10-CM | POA: Diagnosis not present

## 2023-12-31 LAB — CBC
HCT: 25.1 % — ABNORMAL LOW (ref 39.0–52.0)
Hemoglobin: 8.7 g/dL — ABNORMAL LOW (ref 13.0–17.0)
MCH: 31.3 pg (ref 26.0–34.0)
MCHC: 34.7 g/dL (ref 30.0–36.0)
MCV: 90.3 fL (ref 80.0–100.0)
Platelets: 183 10*3/uL (ref 150–400)
RBC: 2.78 MIL/uL — ABNORMAL LOW (ref 4.22–5.81)
RDW: 18.8 % — ABNORMAL HIGH (ref 11.5–15.5)
WBC: 6 10*3/uL (ref 4.0–10.5)
nRBC: 0.5 % — ABNORMAL HIGH (ref 0.0–0.2)

## 2023-12-31 LAB — HEPATIC FUNCTION PANEL
ALT: 439 U/L — ABNORMAL HIGH (ref 0–44)
AST: 147 U/L — ABNORMAL HIGH (ref 15–41)
Albumin: 2.8 g/dL — ABNORMAL LOW (ref 3.5–5.0)
Alkaline Phosphatase: 106 U/L (ref 38–126)
Bilirubin, Direct: 0.5 mg/dL — ABNORMAL HIGH (ref 0.0–0.2)
Indirect Bilirubin: 1.3 mg/dL — ABNORMAL HIGH (ref 0.3–0.9)
Total Bilirubin: 1.8 mg/dL — ABNORMAL HIGH (ref 0.0–1.2)
Total Protein: 5.6 g/dL — ABNORMAL LOW (ref 6.5–8.1)

## 2023-12-31 LAB — BASIC METABOLIC PANEL WITH GFR
Anion gap: 10 (ref 5–15)
BUN: 43 mg/dL — ABNORMAL HIGH (ref 8–23)
CO2: 24 mmol/L (ref 22–32)
Calcium: 7.5 mg/dL — ABNORMAL LOW (ref 8.9–10.3)
Chloride: 104 mmol/L (ref 98–111)
Creatinine, Ser: 1.95 mg/dL — ABNORMAL HIGH (ref 0.61–1.24)
GFR, Estimated: 33 mL/min — ABNORMAL LOW (ref >60)
Glucose, Bld: 93 mg/dL (ref 70–99)
Potassium: 3.7 mmol/L (ref 3.5–5.1)
Sodium: 138 mmol/L (ref 135–145)

## 2023-12-31 LAB — CULTURE, BLOOD (ROUTINE X 2)
Culture: NO GROWTH
Culture: NO GROWTH

## 2023-12-31 MED ORDER — AMOXICILLIN-POT CLAVULANATE 875-125 MG PO TABS: 1.0000 | ORAL_TABLET | Freq: Two times a day (BID) | ORAL | 0 refills | Status: AC

## 2023-12-31 MED ORDER — PANTOPRAZOLE SODIUM 40 MG PO TBEC
40.0000 mg | DELAYED_RELEASE_TABLET | Freq: Two times a day (BID) | ORAL | Status: DC
  Administered 2023-12-31: 40 mg via ORAL
  Filled 2023-12-31: qty 1

## 2023-12-31 MED ORDER — ADULT MULTIVITAMIN W/MINERALS CH: 1.0000 | ORAL_TABLET | Freq: Every day | ORAL | Status: AC

## 2023-12-31 MED ORDER — VITAMIN B-1 100 MG PO TABS: 100.0000 mg | ORAL_TABLET | Freq: Every day | ORAL | Status: AC

## 2023-12-31 MED ORDER — AMIODARONE HCL 200 MG PO TABS: ORAL_TABLET | ORAL | 0 refills | Status: AC

## 2023-12-31 NOTE — TOC Transition Note (Signed)
 Transition of Care [facility]) - Discharge Note   Patient Details  Name: Jorge Martin MRN: [id number] Date of Birth: Mar 27, 1935  Transition of Care [facility]) CM/SW Contact:  Carmon Christen, LCSWA Phone Number: 12/31/2023, 1:24 PM   Clinical Narrative:     Patient will DC to: [facility]  Anticipated DC date: 12/30/2023  Family notified: Sallyanne Creamer and Catha Clink   Transport by: Lyna Sandhoff  ?  Per MD patient ready for DC to [facility]. RN, patient, patient's family, and facility notified of DC. Patients daughters confirmed they will bring patients cpap from home to facility. Discharge Summary sent to facility. RN given number for report tele# [phone] RM# [phone]. DC packet on chart. DNR signed by MD attached to patients DC packet. Ambulance transport requested for patient.  CSW signing off.   Final next level of care: Skilled Nursing Facility Barriers to Discharge: No Barriers Identified   Patient Goals and CMS Choice Patient states their goals for this hospitalization and ongoing recovery are:: SNF   Choice offered to / list presented to : Patient, Adult Children (patient and daughters)      Discharge Placement              Patient chooses bed at: [facility] Patient to be transferred to facility by: [facility] Name of family member notified: Sallyanne Creamer and Catha Clink Patient and family notified of of transfer: 12/31/23  Discharge Plan and Services Additional resources added to the After Visit Summary for   In-house Referral: Clinical Social Work                                   Social Drivers of Health (SDOH) Interventions SDOH Screenings   Food Insecurity: No Food Insecurity (12/26/2023)  Housing: Low Risk  (12/26/2023)  Recent Concern: Housing - High Risk (12/26/2023)  Transportation Needs: No Transportation Needs (12/26/2023)  Utilities: Not At Risk (12/26/2023)  Depression (PHQ2-9): Low Risk  (05/16/2020)  Social  Connections: Moderately Integrated (12/26/2023)  Tobacco Use: Low Risk  (12/28/2023)     Readmission Risk Interventions     No data to display

## 2023-12-31 NOTE — Progress Notes (Addendum)
 3 Days Post-Op  Subjective: CC: No abdominal pain, n/v. Tolerating po. Last BM on chart today.    Afebrile. No tachycardia or hypotension. WBC wnl. LFT's overall downtrending with normal alk phos and T. Bili of 1.8. Hgb stable at 8.7.   Objective: Vital signs in last 24 hours: Temp:  [97.8 F (36.6 C)-98.3 F (36.8 C)] 98 F (36.7 C) (04/16 0949) Pulse Rate:  [63-83] 69 (04/16 0949) Resp:  [18-23] 18 (04/16 0452) BP: (110-138)/(59-93) 138/93 (04/16 0949) SpO2:  [97 %-98 %] 98 % (04/16 0452) Weight:  [68.3 kg] 68.3 kg (04/16 0452) Last BM Date : 12/31/23  Intake/Output from previous day: 04/15 0701 - 04/16 0700 In: 1237.7 [P.O.:720; I.V.:217.6; IV Piggyback:300] Out: 775 [Urine:775] Intake/Output this shift: No intake/output data recorded.  PE: Gen:  Alert, NAD, pleasant Abd: Soft, mild distension, appropriately tender around laparoscopic incisions, no rigidity or guarding and otherwise NT, +BS. Incisions with glue intact appears well and are without drainage, bleeding, or signs of infection.  Lab Results:  Recent Labs    12/30/23 0829 12/31/23 0430  WBC 5.5 6.0  HGB 8.5* 8.7*  HCT 24.2* 25.1*  PLT 203 183   BMET Recent Labs    12/30/23 0508 12/31/23 0430  NA 138 138  K 3.9 3.7  CL 99 104  CO2 25 24  GLUCOSE 119* 93  BUN 50* 43*  CREATININE 2.53* 1.95*  CALCIUM 7.9* 7.5*   PT/INR Recent Labs    12/29/23 0427 12/30/23 0508  LABPROT 16.0* 14.2  INR 1.3* 1.1   CMP     Component Value Date/Time   NA 138 12/31/2023 0430   NA 141 11/13/2020 0928   K 3.7 12/31/2023 0430   CL 104 12/31/2023 0430   CO2 24 12/31/2023 0430   GLUCOSE 93 12/31/2023 0430   BUN 43 (H) 12/31/2023 0430   BUN 23 11/13/2020 0928   CREATININE 1.95 (H) 12/31/2023 0430   CREATININE 1.27 (H) 08/11/2017 1042   CALCIUM 7.5 (L) 12/31/2023 0430   PROT 5.6 (L) 12/31/2023 0912   PROT 6.9 11/13/2020 0928   ALBUMIN 2.8 (L) 12/31/2023 0912   ALBUMIN 4.5 11/13/2020 0928   AST 147  (H) 12/31/2023 0912   ALT 439 (H) 12/31/2023 0912   ALKPHOS 106 12/31/2023 0912   BILITOT 1.8 (H) 12/31/2023 0912   BILITOT 0.6 11/13/2020 0928   GFRNONAA 33 (L) 12/31/2023 0430   GFRNONAA 53 (L) 08/11/2017 1042   GFRAA 49 (L) 11/05/2019 1540   GFRAA 61 08/11/2017 1042   Lipase     Component Value Date/Time   LIPASE 76 (H) 12/26/2023 0900    Studies/Results: VAS US  LOWER EXTREMITY VENOUS (DVT) Result Date: 12/29/2023  Lower Venous DVT Study Patient Name:  Jorge Martin  Date of Exam:   12/29/2023 Medical Rec #: [id number]         Accession #:    [id number] Date of Birth: 1934/09/21         Patient Gender: M Patient Age:   89 years Exam Location:  [facility] Procedure:      VAS US  LOWER EXTREMITY VENOUS (DVT) Referring Phys: Deundra Bard --------------------------------------------------------------------------------  Indications: Edema.  Comparison Study: No previous exams Performing Technologist: Jody Hill RVT, RDMS  Examination Guidelines: A complete evaluation includes B-mode imaging, spectral Doppler, color Doppler, and power Doppler as needed of all accessible portions of each vessel. Bilateral testing is considered an integral part of a complete examination. Limited examinations for reoccurring indications  may be performed as noted. The reflux portion of the exam is performed with the patient in reverse Trendelenburg.  +---------+---------------+---------+-----------+----------+--------------+ RIGHT    CompressibilityPhasicitySpontaneityPropertiesThrombus Aging +---------+---------------+---------+-----------+----------+--------------+ CFV      Full           Yes      Yes                                 +---------+---------------+---------+-----------+----------+--------------+ SFJ      Full                                                        +---------+---------------+---------+-----------+----------+--------------+ FV Prox  Full           Yes      Yes                                  +---------+---------------+---------+-----------+----------+--------------+ FV Mid   Full           Yes      Yes                                 +---------+---------------+---------+-----------+----------+--------------+ FV DistalFull           Yes      Yes                                 +---------+---------------+---------+-----------+----------+--------------+ PFV      Full                                                        +---------+---------------+---------+-----------+----------+--------------+ POP      Full           Yes      Yes                                 +---------+---------------+---------+-----------+----------+--------------+ PTV      Full                                                        +---------+---------------+---------+-----------+----------+--------------+ PERO     Full                                                        +---------+---------------+---------+-----------+----------+--------------+   +---------+---------------+---------+-----------+----------+--------------+ LEFT     CompressibilityPhasicitySpontaneityPropertiesThrombus Aging +---------+---------------+---------+-----------+----------+--------------+ CFV      Full           Yes      Yes                                 +---------+---------------+---------+-----------+----------+--------------+  SFJ      Full                                                        +---------+---------------+---------+-----------+----------+--------------+ FV Prox  Full           Yes      Yes                                 +---------+---------------+---------+-----------+----------+--------------+ FV Mid   Full           Yes      Yes                                 +---------+---------------+---------+-----------+----------+--------------+ FV DistalFull           Yes      Yes                                  +---------+---------------+---------+-----------+----------+--------------+ PFV      Full                                                        +---------+---------------+---------+-----------+----------+--------------+ POP      Full           Yes      Yes                                 +---------+---------------+---------+-----------+----------+--------------+ PTV      Full                                                        +---------+---------------+---------+-----------+----------+--------------+ PERO     Full                                                        +---------+---------------+---------+-----------+----------+--------------+     Summary: BILATERAL: - No evidence of deep vein thrombosis seen in the lower extremities, bilaterally. -No evidence of popliteal cyst, bilaterally.   *See table(s) above for measurements and observations. Electronically signed by Sherald Hess MD on 12/29/2023 at 3:05:29 PM.    Final     Anti-infectives: Anti-infectives (From admission, onward)    Start     Dose/Rate Route Frequency Ordered Stop   12/27/23 1445  piperacillin-tazobactam (ZOSYN) IVPB 2.25 g        2.25 g 100 mL/hr over 30 Minutes Intravenous Every 8 hours 12/27/23 1357     12/27/23 1100  ceFEPIme (MAXIPIME) 2 g in sodium chloride 0.9 % 100 mL IVPB  Status:  Discontinued  2 g 200 mL/hr over 30 Minutes Intravenous Every 24 hours 12/26/23 1704 12/27/23 1357   12/27/23 0100  metroNIDAZOLE (FLAGYL) IVPB 500 mg  Status:  Discontinued        500 mg 100 mL/hr over 60 Minutes Intravenous Every 12 hours 12/26/23 1703 12/27/23 1357   12/26/23 2200  metroNIDAZOLE (FLAGYL) IVPB 500 mg  Status:  Discontinued        500 mg 100 mL/hr over 60 Minutes Intravenous Every 12 hours 12/26/23 1654 12/26/23 1703   12/26/23 1704  vancomycin variable dose per unstable renal function (pharmacist dosing)  Status:  Discontinued         Does not apply See admin instructions  12/26/23 1704 12/27/23 1348   12/26/23 1015  ceFEPIme (MAXIPIME) 2 g in sodium chloride 0.9 % 100 mL IVPB        2 g 200 mL/hr over 30 Minutes Intravenous  Once 12/26/23 1001 12/26/23 1300   12/26/23 1015  metroNIDAZOLE (FLAGYL) IVPB 500 mg        500 mg 100 mL/hr over 60 Minutes Intravenous  Once 12/26/23 1001 12/26/23 1501   12/26/23 1015  vancomycin (VANCOCIN) IVPB 1000 mg/200 mL premix        1,000 mg 200 mL/hr over 60 Minutes Intravenous  Once 12/26/23 1001 12/26/23 1618        Assessment/Plan POD 3 s/p Laparoscopic cholecystectomy with IOC for Gangrenous cholecystitis by Dr. Davonna Estes on 12/28/23 - IOC neg. LFT's overall trending down. Cont to monitor - Cont abx, 4d post op - Ok for diet - Mobilize, PT - rec SNF - Pulm toilet  - Ok for d/c to SNF. Can switch to PO abx to complete 4d post op abx if d/c before completion.   FEN - HH VTE - SCDs, okay for chem ppx from a general surgery standpoint. If felt to need therapeutic anticoagulation would favor heparin gtt or therapeutic lovenox until ensure hgb stable  ID - Zosyn Foley - In place, okay to d/c from our standpoint - per [facility]   Presented w/ shock with signs of acute kidney and liver dysfunction - out of ICU New onset A. Fib - was in RVR. Converted back to sinus  AKI on CKD - cr improving Reported black stools - fecal occult blood negative  EtOH abuse  HTN HLD OSA on CPAP prediabetes PMH Prostate CA s/p radiation therapy, Dr. Jodeane Mulligan. Lorri Rota Headaches GERD   LOS: 5 days    Delton Filbert, [facility] 12/31/2023, 11:16 AM Please see Amion for pager number during day hours 7:00am-4:30pm

## 2023-12-31 NOTE — Progress Notes (Addendum)
 Patient voided 120 post catheter removal. Bladder scan showed 0ml in bladder post void.

## 2023-12-31 NOTE — TOC Progression Note (Signed)
 Transition of Care [facility]) - Progression Note    Patient Details  Name: SHAUNE WESTFALL MRN: [id number] Date of Birth: 1935/02/12  [facility] CM/SW Contact  Carmon Christen, LCSWA Phone Number: 12/31/2023, 12:52 PM  Clinical Narrative:     CSW spoke with Nicki at [facility] who confirmed patient can dc over today if medically stable. CSW informed MD.  Expected Discharge Plan: Skilled Nursing Facility Barriers to Discharge: Continued Medical Work up  Expected Discharge Plan and Services In-house Referral: Clinical Social Work     Living arrangements for the past 2 months: Single Family Home                                       Social Determinants of Health (SDOH) Interventions SDOH Screenings   Food Insecurity: No Food Insecurity (12/26/2023)  Housing: Low Risk  (12/26/2023)  Recent Concern: Housing - High Risk (12/26/2023)  Transportation Needs: No Transportation Needs (12/26/2023)  Utilities: Not At Risk (12/26/2023)  Depression (PHQ2-9): Low Risk  (05/16/2020)  Social Connections: Moderately Integrated (12/26/2023)  Tobacco Use: Low Risk  (12/28/2023)    Readmission Risk Interventions     No data to display

## 2023-12-31 NOTE — Discharge Summary (Signed)
 Physician Discharge Summary  Jorge Martin:[id number] DOB: 04-14-1935 DOA: 12/26/2023  PCP: Pcp, No  Admit date: 12/26/2023 Discharge date: 12/31/2023  Disposition:  SNF  Recommendations for Outpatient Follow-up:  Follow up with PCP  Follow up with General surgery  Repeat CMP as outpatient to check creatinine and LFTs  Discharge Condition: Stable CODE STATUS: DNR  Diet recommendation:  Diet Orders (From admission, onward)     Start     Ordered   12/29/23 1248  Diet Heart Room service appropriate? Yes; Fluid consistency: Thin  Diet effective now       Question Answer Comment  Room service appropriate? Yes   Fluid consistency: Thin      12/29/23 1248           Brief/Interim Summary: Jorge Martin is an 89 y.o. M with PMH of HTN and early Alzheimer's, HTN, OSA, Prostate Ca, CKD 3b who has a history of ETOH abuse. Apparently he had quit drinking alcohol for some time but then picked it up again couple of months prior to this admission. He then quit again cold [place] 7 to 8 days prior to admission. Subsequently he lost his appetite and started having nausea and vomiting. He was found to have atrial fibrillation with RVR. There was also concern for cholecystitis. He was hospitalized for further management.  During his hospital stay, patient underwent laparoscopic cholecystectomy on 4/13 with improvement of LFTs.  Discharge Diagnoses:   Principal Problem:   Acute cholecystitis Active Problems:   Atrial fibrillation (HCC)   AKI (acute kidney injury) (HCC)   Lactic acidosis   Elevated LFTs   Septic shock (HCC)   Atrial fibrillation with rapid ventricular response (HCC)   CKD stage 3b, GFR 30-44 ml/min (HCC)   Septic shock Most likely secondary to cholecystitis.  Did not require vasopressors but he was in the ICU.  Resolved   Acute cholecystitis/significantly abnormal LFTs S/p laparoscopic cholecystectomy on 4/13 LFTs improving Augmentin for 1 more day on discharge  4/17   New onset atrial fibrillation with RVR Was treated with amiodarone.  Back to sinus rhythm. Likely triggered by acute illness.  Atrial fibrillation was for very short duration.  Not deemed to require anticoagulation. Continue amiodarone 200 mg twice a day for 1 week and then change to 200 mg once a day for 2 weeks.   Acute kidney injury superimposed on chronic kidney disease stage IIIb/hypokalemia Creatinine in 2023 was 1.63. Came in with creatinine of 3.18.  No hydronephrosis noted on CT scan. Patient treated for infection and given IV fluids.   Improving.  Creatinine 1.9 on day of discharge.  Foley catheter to be removed prior to discharge.   Normocytic anemia Significant drop in hemoglobin noted.  Hemoglobin was 13.5 at admission.  It went down to 9.8 and then 8.8.  No overt bleeding noted. Anemia panel shows ferritin of 2088, iron of 43, TIBC 127, percent saturation 34.  B12 level 2329 and folic acid 9.9. Hemoglobin was 11.6 in 2023.  Initial values during this admission might have been hemoconcentrated.  Likely a dilutional drop.   Stable   RV dilatation on echocardiogram No previous echoes available for comparison.  LVEF was normal.  RV function was normal. May benefit from outpatient cardiology evaluation.   Acute respiratory failure with hypoxia Resolved  History of alcoholism Not in withdrawal.  Continue with thiamine multivitamins.   History of dementia Continue with Aricept.   History of essential hypertension Resume amlodipine.  Losartan continues  to be on hold due to AKI.  Resume as outpatient  Hyperlipidemia Hold Lipitor until LFTs normalize as outpatient   Discharge Instructions  Discharge Instructions     Increase activity slowly   Complete by: As directed       Allergies as of 12/31/2023   No Known Allergies      Medication List     PAUSE taking these medications    atorvastatin 20 MG tablet Wait to take this until your doctor or other  care provider tells you to start again. Commonly known as: LIPITOR Take 1 tablet by mouth every evening.   losartan 50 MG tablet Wait to take this until your doctor or other care provider tells you to start again. Commonly known as: COZAAR Take 50 mg by mouth daily.       STOP taking these medications    acetaminophen 500 MG tablet Commonly known as: TYLENOL       TAKE these medications    amiodarone 200 MG tablet Commonly known as: PACERONE Continue amiodarone 200 mg twice a day for 1 week (4/13-4/19) and then change to 200 mg once a day for 2 weeks (4/20-5/3)   amLODipine 10 MG tablet Commonly known as: NORVASC Take 10 mg by mouth daily.   amoxicillin-clavulanate 875-125 MG tablet Commonly known as: AUGMENTIN Take 1 tablet by mouth 2 (two) times daily for 1 day. Start taking on: January 01, 2024   donepezil 10 MG tablet Commonly known as: ARICEPT Take 10 mg by mouth at bedtime.   esomeprazole 40 MG capsule Commonly known as: NEXIUM TAKE 1 CAPSULE BY MOUTH DAILY BEFORE BREAKFAST. What changed: See the new instructions.   fluticasone 50 MCG/ACT nasal spray Commonly known as: FLONASE Place 2 sprays into both nostrils daily.   mirtazapine 15 MG tablet Commonly known as: REMERON Take 15 mg by mouth at bedtime.   multivitamin with minerals Tabs tablet Take 1 tablet by mouth daily. Start taking on: January 01, 2024   thiamine 100 MG tablet Commonly known as: Vitamin B-1 Take 1 tablet (100 mg total) by mouth daily. Start taking on: January 01, 2024   VITAMIN B-12 PO Take 1 tablet by mouth daily.        Contact information for follow-up providers     Maczis, Hedda Slade, PA-C Follow up on 01/27/2024.   Specialty: General Surgery Why: 5/13 at 10:00. Please bring a copy of your photo ID, insurance card and arrive 30 minutes prior to your appointment for paperwork. Contact information: [address] [place] [address] [phone]          Kallie Locks, FNP Follow up.   Specialty: Family Medicine Contact information: [address] [place] [address] [phone]              Contact information for after-discharge care     Destination     [facility] Preferred SNF .   Service: Skilled Nursing Contact information: [address] [place] [address] [phone]                    No Known Allergies  Procedures/Studies: VAS [place] LOWER EXTREMITY VENOUS (DVT) Result Date: 12/29/2023  Lower Venous DVT Study Patient Name:  Jorge Martin  Date of Exam:   12/29/2023 Medical Rec #: [id number]         Accession #:    [id number] Date of Birth: 08-25-1935  Patient Gender: M Patient Age:   57 years Exam Location:  [facility] Procedure:      VAS [place] LOWER EXTREMITY VENOUS (DVT) Referring Phys: MICHAEL MACZIS --------------------------------------------------------------------------------  Indications: Edema.  Comparison Study: No previous exams Performing Technologist: Jody Hill RVT, RDMS  Examination Guidelines: A complete evaluation includes B-mode imaging, spectral Doppler, color Doppler, and power Doppler as needed of all accessible portions of each vessel. Bilateral testing is considered an integral part of a complete examination. Limited examinations for reoccurring indications may be performed as noted. The reflux portion of the exam is performed with the patient in reverse Trendelenburg.  +---------+---------------+---------+-----------+----------+--------------+ RIGHT    CompressibilityPhasicitySpontaneityPropertiesThrombus Aging +---------+---------------+---------+-----------+----------+--------------+ CFV      Full           Yes      Yes                                 +---------+---------------+---------+-----------+----------+--------------+ SFJ      Full                                                         +---------+---------------+---------+-----------+----------+--------------+ FV Prox  Full           Yes      Yes                                 +---------+---------------+---------+-----------+----------+--------------+ FV Mid   Full           Yes      Yes                                 +---------+---------------+---------+-----------+----------+--------------+ FV DistalFull           Yes      Yes                                 +---------+---------------+---------+-----------+----------+--------------+ PFV      Full                                                        +---------+---------------+---------+-----------+----------+--------------+ POP      Full           Yes      Yes                                 +---------+---------------+---------+-----------+----------+--------------+ PTV      Full                                                        +---------+---------------+---------+-----------+----------+--------------+ PERO     Full                                                        +---------+---------------+---------+-----------+----------+--------------+   +---------+---------------+---------+-----------+----------+--------------+  LEFT     CompressibilityPhasicitySpontaneityPropertiesThrombus Aging +---------+---------------+---------+-----------+----------+--------------+ CFV      Full           Yes      Yes                                 +---------+---------------+---------+-----------+----------+--------------+ SFJ      Full                                                        +---------+---------------+---------+-----------+----------+--------------+ FV Prox  Full           Yes      Yes                                 +---------+---------------+---------+-----------+----------+--------------+ FV Mid   Full           Yes      Yes                                  +---------+---------------+---------+-----------+----------+--------------+ FV DistalFull           Yes      Yes                                 +---------+---------------+---------+-----------+----------+--------------+ PFV      Full                                                        +---------+---------------+---------+-----------+----------+--------------+ POP      Full           Yes      Yes                                 +---------+---------------+---------+-----------+----------+--------------+ PTV      Full                                                        +---------+---------------+---------+-----------+----------+--------------+ PERO     Full                                                        +---------+---------------+---------+-----------+----------+--------------+     Summary: BILATERAL: - No evidence of deep vein thrombosis seen in the lower extremities, bilaterally. -No evidence of popliteal cyst, bilaterally.   *See table(s) above for measurements and observations. Electronically signed by Jimmye Moulds MD on 12/29/2023 at 3:05:29 PM.    Final    DG Cholangiogram Operative Result Date: 12/28/2023 CLINICAL DATA:  Laparoscopic cholecystectomy. EXAM: INTRAOPERATIVE  CHOLANGIOGRAM TECHNIQUE: Cholangiographic images from the C-arm fluoroscopic device were submitted for interpretation post-operatively. Please see the procedural report for the amount of contrast and the fluoroscopy time utilized. FLUOROSCOPY: Radiation Exposure Index (as provided by the fluoroscopic device): 3.2 mGy Kerma COMPARISON:  None Available. FINDINGS: 2 spot fluoro runs obtained during intraoperative cholangiography are provided. Initial run shows cannulation of the cystic duct with good opacification of the intrahepatic and proximal extrahepatic biliary system. No filling defect or contrast extravasation. 2nd series shows intrahepatic biliary ducts as well as the extrahepatic  common duct and common bile duct to the level of the ampulla. There is no filling defect in the common bile duct to suggest choledocholithiasis. Contrast material flows freely through the ampulla into the lumen of the duodenum. No contrast extravasation. IMPRESSION: Negative intraoperative cholangiogram. Electronically Signed   By: Donnal Fusi M.D.   On: 12/28/2023 12:54   NM Hepatobiliary Liver Func Result Date: 12/27/2023 CLINICAL DATA:  Concern for cholecystitis. EXAM: NUCLEAR MEDICINE HEPATOBILIARY IMAGING TECHNIQUE: Sequential images of the abdomen were obtained out to 60 minutes following intravenous administration of radiopharmaceutical. RADIOPHARMACEUTICALS:  5.3 mCi Tc-33m  Choletec IV COMPARISON:  CT 12/26/2023 FINDINGS: Prompt clearance of radiotracer from blood pool and homogeneous uptake liver. Counts are present in the small bowel by 60 minutes. The gallbladder is not identified at 60 minutes. 2.5 mg IV morphine was administered to augment filling of the gallbladder. Non filling of the gallbladder following morphine augmentation. IMPRESSION: Non filling of the gallbladder suggests cystic duct obstruction/acute cholecystitis. Of note, fasting state of greater than 48 hours can cause false positives. Electronically Signed   By: Deboraha Fallow M.D.   On: 12/27/2023 17:31   ECHOCARDIOGRAM COMPLETE Result Date: 12/27/2023    ECHOCARDIOGRAM REPORT   Patient Name:   Jorge Martin Date of Exam: 12/27/2023 Medical Rec #:  [id number]        Height:       70.0 in Accession #:    [id number]       Weight:       155.0 lb Date of Birth:  Aug 12, 1935        BSA:          1.873 m Patient Age:    89 years         BP:           121/70 mmHg Patient Gender: M                HR:           61 bpm. Exam Location:  Inpatient Procedure: 2D Echo, Color Doppler, Cardiac Doppler and Intracardiac            Opacification Agent (Both Spectral and Color Flow Doppler were            utilized during procedure). Indications:     I48.91* Unspecified atrial fibrillation  History:        Patient has no prior history of Echocardiogram examinations.                 Arrythmias:Atrial Fibrillation; Risk Factors:Hypertension,                 Dyslipidemia and Sleep Apnea.  Sonographer:    Sherline Distel Senior RDCS Referring Phys: [id number] Margie Sheller  Sonographer Comments: Very difficult study due to rib/lung interference IMPRESSIONS  1. Left ventricular ejection fraction, by estimation, is 60 to 65%. Left ventricular ejection fraction by PLAX is 62 %. The  left ventricle has normal function. Left ventricular endocardial border not optimally defined to evaluate regional wall motion. Left ventricular diastolic parameters are indeterminate.  2. Right ventricular systolic function is normal. The right ventricular size is severely enlarged. There is normal pulmonary artery systolic pressure. The estimated right ventricular systolic pressure is 24.9 mmHg.  3. The mitral valve is normal in structure. Trivial mitral valve regurgitation.  4. The aortic valve has an indeterminant number of cusps. Aortic valve regurgitation is not visualized.  5. The inferior vena cava is normal in size with greater than 50% respiratory variability, suggesting right atrial pressure of 3 mmHg. Comparison(s): No prior Echocardiogram. FINDINGS  Left Ventricle: No strain or 3D transmitted. Left ventricular ejection fraction, by estimation, is 60 to 65%. Left ventricular ejection fraction by PLAX is 62 %. The left ventricle has normal function. Left ventricular endocardial border not optimally defined to evaluate regional wall motion. Definity contrast agent was given IV to delineate the left ventricular endocardial borders. Strain was performed and the global longitudinal strain is indeterminate. The left ventricular internal cavity size was normal in size. There is borderline concentric left ventricular hypertrophy. Left ventricular diastolic parameters are indeterminate. Right  Ventricle: The right ventricular size is severely enlarged. No increase in right ventricular wall thickness. Right ventricular systolic function is normal. There is normal pulmonary artery systolic pressure. The tricuspid regurgitant velocity is 2.34 m/s, and with an assumed right atrial pressure of 3 mmHg, the estimated right ventricular systolic pressure is 24.9 mmHg. Left Atrium: Left atrial size was normal in size. Right Atrium: Right atrial size was normal in size. Pericardium: There is no evidence of pericardial effusion. Mitral Valve: The mitral valve is normal in structure. Trivial mitral valve regurgitation. Tricuspid Valve: The tricuspid valve is normal in structure. Tricuspid valve regurgitation is mild. Aortic Valve: The aortic valve has an indeterminant number of cusps. Aortic valve regurgitation is not visualized. Pulmonic Valve: The pulmonic valve was not well visualized. Pulmonic valve regurgitation is not visualized. Aorta: The ascending aorta was not well visualized and the aortic root is normal in size and structure. Venous: The inferior vena cava is normal in size with greater than 50% respiratory variability, suggesting right atrial pressure of 3 mmHg. IAS/Shunts: The atrial septum is grossly normal. Additional Comments: 3D was performed not requiring image post processing on an independent workstation and was indeterminate.  LEFT VENTRICLE PLAX 2D LV EF:         Left ventricular ejection fraction by PLAX is 62 %. LVIDd:         3.70 cm LVIDs:         2.50 cm LV PW:         1.00 cm LV IVS:        1.10 cm LVOT diam:     2.00 cm LVOT Area:     3.14 cm  RIGHT VENTRICLE RV S prime:     13.20 cm/s TAPSE (M-mode): 2.9 cm LEFT ATRIUM             Index        RIGHT ATRIUM           Index LA diam:        3.90 cm 2.08 cm/m   RA Area:     17.60 cm LA Vol (A2C):   36.4 ml 19.43 ml/m  RA Volume:   45.70 ml  24.40 ml/m LA Vol (A4C):   31.2 ml 16.66 ml/m LA Biplane Vol: 35.2 ml  18.79 ml/m   AORTA Ao  Root diam: 3.50 cm Ao Asc diam:  3.60 cm TRICUSPID VALVE TR Peak grad:   21.9 mmHg TR Vmax:        234.00 cm/s  SHUNTS Systemic Diam: 2.00 cm Riley Lam MD Electronically signed by Riley Lam MD Signature Date/Time: 12/27/2023/2:02:38 PM    Final    DG CHEST PORT 1 VIEW Result Date: 12/27/2023 CLINICAL DATA:  Vomiting with weakness and fatigue. EXAM: PORTABLE CHEST 1 VIEW COMPARISON:  12/26/2023 FINDINGS: Similar asymmetric elevation right hemidiaphragm. Similar right infrahilar density compatible with right lower lung collapse/consolidation. Lungs otherwise clear. The cardio pericardial silhouette is enlarged. Telemetry leads overlie the chest. IMPRESSION: Similar right infrahilar density compatible with right lower lung collapse/consolidation. Electronically Signed   By: Kennith Center M.D.   On: 12/27/2023 08:38   CT ABDOMEN PELVIS WO CONTRAST Result Date: 12/26/2023 CLINICAL DATA:  nvd, abd pain EXAM: CT ABDOMEN AND PELVIS WITHOUT CONTRAST TECHNIQUE: Multidetector CT imaging of the abdomen and pelvis was performed following the standard protocol without IV contrast. Of note, the lack of intravenous contrast limits evaluation of the solid organ parenchyma and vascularity. RADIATION DOSE REDUCTION: This exam was performed according to the departmental dose-optimization program which includes automated exposure control, adjustment of the mA and/or kV according to patient size and/or use of iterative reconstruction technique. COMPARISON:  March 30, 2021, June 19, 2022 FINDINGS: Lower chest: No focal airspace consolidation or pleural effusion.Elevation of the right hemidiaphragm with subsegmental and posterior right basilar dependent atelectasis. Hepatobiliary: Severe diffuse hepatic steatosis. No mass.The gallbladder is distended and diffusely fluid-filled. Multiple small radiopaque stones. Subtle inflammation within the porta hepatis and gallbladder neck/downstream body.No intrahepatic or  extrahepatic biliary ductal dilation. No obstructive, radiopaque choledocholithiasis. Pancreas: Mild parenchymal atrophy. No mass. Subtle fatty induration about the pancreatic head and uncinate process.No well-formed or drainable peripancreatic fluid collection. Spleen: Normal size. No mass. Adrenals/Urinary Tract: No adrenal masses. No mass. A few small cysts noted in both kidneys. No hydronephrosis or nephrolithiasis. Mild circumferential wall thickening of the urinary bladder. Stomach/Bowel:Small sliding-type hiatal hernia. The stomach contains ingested material without focal abnormality. No small bowel wall thickening or inflammation. No small bowel obstruction. Normal appendix. Vascular/Lymphatic: No aortic aneurysm. Diffuse aortoiliac atherosclerosis. No intraabdominal or pelvic lymphadenopathy. Reproductive: Moderate prostatomegaly. Likely fiducial markers within the prostate gland.No free pelvic fluid. Other: No pneumoperitoneum, ascites, or mesenteric inflammation. Musculoskeletal: No acute fracture or destructive lesion.Small volume symmetric bilateral gynecomastia. Multilevel degenerative disc disease of the spine. Mild grade 1 anterolisthesis of L4 on L5. IMPRESSION: 1. Fluid-filled, distended gallbladder with multiple radiopaque stones. There is induration of the pericholecystic fat around the gallbladder neck and downstream body with similar inflammation surrounding the pancreatic head and uncinate process. This could represent changes of early acute cholecystitis or acute interstitial edematous pancreatitis. Alternatively, these changes could also reflect findings of acute hepatitis. Correlation with serum lipase and liver enzymes recommended. 2. Circumferential wall thickening of the urinary bladder, which may be due to underdistension, chronic bladder outlet obstruction, or acute cystitis. Correlation with urinalysis is recommended. 3. Moderate prostatomegaly. 4. Small sliding-type hiatal hernia.  Electronically Signed   By: Wallie Char M.D.   On: 12/26/2023 14:11   US Abdomen Limited RUQ (LIVER/GB) Result Date: 12/26/2023 CLINICAL DATA:  Elevated liver function tests EXAM: ULTRASOUND ABDOMEN LIMITED RIGHT UPPER QUADRANT COMPARISON:  CT abdomen pelvis 06/19/2022 FINDINGS: Gallbladder: Gallstones: Present Sludge: Present Gallbladder Wall: Within normal limits Pericholecystic fluid: None Sonographic Murphy's Sign: Negative per  technologist Common bile duct: Diameter: 10 mm Liver: Parenchymal echogenicity: Diffusely increased Contours: Normal Lesions: None Portal vein: Patent.  Hepatopetal flow Other: None. IMPRESSION: 1. Cholelithiasis without additional sonographic evidence of acute cholecystitis. 2. Dilated common bile duct measuring up to 10 mm. If the patient's liver function tests are suggestive of biliary obstruction, further evaluation with MRI/MRCP should be performed. 3. Diffuse increased echogenicity of the hepatic parenchyma is a nonspecific indicator of hepatocellular dysfunction, most commonly steatosis. Electronically Signed   By: Elester Grim M.D.   On: 12/26/2023 12:19   DG Chest Port 1 View Result Date: 12/26/2023 CLINICAL DATA:  Possible sepsis. EXAM: PORTABLE CHEST 1 VIEW COMPARISON:  07/25/2014 FINDINGS: Normal sized heart. Mildly tortuous aorta. Stable large right diaphragmatic eventration with minimal right basilar atelectasis/scarring. Clear left lung. Unremarkable bones. IMPRESSION: 1. No acute abnormality. 2. Stable large right diaphragmatic eventration with minimal right basilar atelectasis/scarring. Electronically Signed   By: Catherin Closs M.D.   On: 12/26/2023 10:39   CT HEAD WO CONTRAST Result Date: 12/26/2023 CLINICAL DATA:  Tremors EXAM: CT HEAD WITHOUT CONTRAST TECHNIQUE: Contiguous axial images were obtained from the base of the skull through the vertex without intravenous contrast. RADIATION DOSE REDUCTION: This exam was performed according to the departmental  dose-optimization program which includes automated exposure control, adjustment of the mA and/or kV according to patient size and/or use of iterative reconstruction technique. COMPARISON:  Brain MRI 09/12/2017 FINDINGS: Brain: No evidence of acute infarction, hemorrhage, hydrocephalus, extra-axial collection or mass lesion/mass effect. Generalized brain atrophy similar to prior. Mild chronic small vessel ischemia in the deep white matter. Vascular: No hyperdense vessel or unexpected calcification. Skull: Normal. Negative for fracture or focal lesion. Sinuses/Orbits: No acute finding. IMPRESSION: Aging brain without acute or reversible finding. No convincing change since a 2018 MRI. Electronically Signed   By: Ronnette Coke M.D.   On: 12/26/2023 10:01      Discharge Exam: Vitals:   12/31/23 0452 12/31/23 0949  BP: (!) 130/59 (!) 138/93  Pulse: 63 69  Resp: 18   Temp: 98.3 F (36.8 C) 98 F (36.7 C)  SpO2: 98%     General: Pt is alert, awake, not in acute distress Cardiovascular: S1/S2 +, no edema Respiratory: CTA bilaterally, no wheezing, no rhonchi, no respiratory distress, no conversational dyspnea  Abdominal: Soft, NT, ND, bowel sounds + Extremities: no edema, no cyanosis Psych: Normal mood and affect, stable judgement and insight     The results of significant diagnostics from this hospitalization (including imaging, microbiology, ancillary and laboratory) are listed below for reference.     Microbiology: Recent Results (from the past 240 hours)  Resp panel by RT-PCR (RSV, Flu A&B, Covid) Urine, Clean Catch     Status: None   Collection Time: 12/26/23 10:00 AM   Specimen: Urine, Clean Catch; Nasal Swab  Result Value Ref Range Status   SARS Coronavirus 2 by RT PCR NEGATIVE NEGATIVE Final   Influenza A by PCR NEGATIVE NEGATIVE Final   Influenza B by PCR NEGATIVE NEGATIVE Final    Comment: (NOTE) The Xpert Xpress SARS-CoV-2/FLU/RSV plus assay is intended as an aid in the  diagnosis of influenza from Nasopharyngeal swab specimens and should not be used as a sole basis for treatment. Nasal washings and aspirates are unacceptable for Xpert Xpress SARS-CoV-2/FLU/RSV testing.  Fact Sheet for Patients: BloggerCourse.com  Fact Sheet for Healthcare Providers: SeriousBroker.it  This test is not yet approved or cleared by the [place]  FDA and has been authorized  for detection and/or diagnosis of SARS-CoV-2 by FDA under an Emergency Use Authorization (EUA). This EUA will remain in effect (meaning this test can be used) for the duration of the COVID-19 declaration under Section 564(b)(1) of the Act, 21 U.S.C. section 360bbb-3(b)(1), unless the authorization is terminated or revoked.     Resp Syncytial Virus by PCR NEGATIVE NEGATIVE Final    Comment: (NOTE) Fact Sheet for Patients: BloggerCourse.com  Fact Sheet for Healthcare Providers: SeriousBroker.it  This test is not yet approved or cleared by the [place] FDA and has been authorized for detection and/or diagnosis of SARS-CoV-2 by FDA under an Emergency Use Authorization (EUA). This EUA will remain in effect (meaning this test can be used) for the duration of the COVID-19 declaration under Section 564(b)(1) of the Act, 21 U.S.C. section 360bbb-3(b)(1), unless the authorization is terminated or revoked.  Performed at [facility], [address] [place], [place] [address]   Blood Culture (routine x 2)     Status: None   Collection Time: 12/26/23 10:01 AM   Specimen: BLOOD LEFT HAND  Result Value Ref Range Status   Specimen Description BLOOD LEFT HAND  Final   Special Requests   Final    AEROBIC BOTTLE ONLY Blood Culture results may not be optimal due to an inadequate volume of blood received in culture bottles   Culture   Final    NO GROWTH 5 DAYS Performed at [facility], [address], [place], [place] [address]    Report Status 12/31/2023 FINAL  Final  Blood Culture (routine x 2)     Status: None   Collection Time: 12/26/23 10:06 AM   Specimen: BLOOD LEFT HAND  Result Value Ref Range Status   Specimen Description BLOOD LEFT HAND  Final   Special Requests   Final    AEROBIC BOTTLE ONLY Blood Culture results may not be optimal due to an inadequate volume of blood received in culture bottles   Culture   Final    NO GROWTH 5 DAYS Performed at [facility], [address], [place], [place] [address]    Report Status 12/31/2023 FINAL  Final  MRSA Next Gen by PCR, Nasal     Status: None   Collection Time: 12/26/23  4:43 PM   Specimen: Nasal Mucosa; Nasal Swab  Result Value Ref Range Status   MRSA by PCR Next Gen NOT DETECTED NOT DETECTED Final    Comment: (NOTE) The GeneXpert MRSA Assay (FDA approved for NASAL specimens only), is one component of a comprehensive MRSA colonization surveillance program. It is not intended to diagnose MRSA infection nor to guide or monitor treatment for MRSA infections. Test performance is not FDA approved in patients less than 16 years old. Performed at [facility], [address], [place], [place] [address]      Labs: BNP (last 3 results) No results for input(s): "BNP" in the last 8760 hours. Basic Metabolic Panel: Recent Labs  Lab 12/26/23 1642 12/27/23 1153 12/28/23 0414 12/29/23 0427 12/30/23 0508 12/31/23 0430  NA 139 139 137 138 138 138  K 3.9 3.6 2.9* 4.0 3.9 3.7  CL 105 102 96* 99 99 104  CO2 14* 14* 25 26 25 24   GLUCOSE 163* 110* 95 154* 119* 93  BUN 36* 49* 54* 53* 50* 43*  CREATININE 2.80* 2.73* 2.77* 2.59* 2.53* 1.95*  CALCIUM 8.3* 8.2* 7.4* 7.8* 7.9* 7.5*  MG 1.1*  --   --   --   --   --  Liver Function Tests: Recent Labs  Lab 12/27/23 1153 12/28/23 0414 12/29/23 0427 12/30/23 0508 12/31/23 0912  AST 2,319* 1,528* 576* 254* 147*  ALT 1,525* 1,258* 792* 597* 439*   ALKPHOS 70 82 92 109 106  BILITOT 2.2* 2.4* 2.4* 1.8* 1.8*  PROT 5.5* 4.9* 5.2* 5.5* 5.6*  ALBUMIN 2.5* 2.2* 2.6* 2.6* 2.8*   Recent Labs  Lab 12/26/23 0900  LIPASE 76*   Recent Labs  Lab 12/26/23 0933  AMMONIA 35   CBC: Recent Labs  Lab 12/26/23 0900 12/26/23 0923 12/28/23 0414 12/29/23 0427 12/30/23 0829 12/31/23 0430  WBC 8.6  --  4.7 4.9 5.5 6.0  NEUTROABS 6.8  --   --   --   --   --   HGB 13.5 14.6 9.8* 8.8* 8.5* 8.7*  HCT 43.2 43.0 29.1* 25.3* 24.2* 25.1*  MCV 98.0  --  91.2 88.2 88.3 90.3  PLT 189  --  151 165 203 183   Cardiac Enzymes: Recent Labs  Lab 12/26/23 0900  CKTOTAL 32*   BNP: Invalid input(s): "POCBNP" CBG: Recent Labs  Lab 12/26/23 1123 12/26/23 1623  GLUCAP 116* 153*   D-Dimer No results for input(s): "DDIMER" in the last 72 hours. Hgb A1c No results for input(s): "HGBA1C" in the last 72 hours. Lipid Profile No results for input(s): "CHOL", "HDL", "LDLCALC", "TRIG", "CHOLHDL", "LDLDIRECT" in the last 72 hours. Thyroid function studies No results for input(s): "TSH", "T4TOTAL", "T3FREE", "THYROIDAB" in the last 72 hours.  Invalid input(s): "FREET3" Anemia work up Recent Labs    12/30/23 0508 12/30/23 0829  VITAMINB12 2,329*  --   FOLATE 9.9  --   FERRITIN 2,088*  --   TIBC 127*  --   IRON 43*  --   RETICCTPCT  --  1.2   Urinalysis    Component Value Date/Time   COLORURINE AMBER (A) 12/26/2023 1451   APPEARANCEUR HAZY (A) 12/26/2023 1451   APPEARANCEUR Cloudy (A) 11/13/2020 0925   LABSPEC 1.021 12/26/2023 1451   PHURINE 5.0 12/26/2023 1451   GLUCOSEU NEGATIVE 12/26/2023 1451   HGBUR NEGATIVE 12/26/2023 1451   BILIRUBINUR NEGATIVE 12/26/2023 1451   BILIRUBINUR Negative 11/13/2020 0925   KETONESUR NEGATIVE 12/26/2023 1451   PROTEINUR 100 (A) 12/26/2023 1451   UROBILINOGEN 1.0 05/16/2020 1008   UROBILINOGEN 1.0 11/21/2017 1330   NITRITE NEGATIVE 12/26/2023 1451   LEUKOCYTESUR TRACE (A) 12/26/2023 1451   Sepsis  Labs Recent Labs  Lab 12/28/23 0414 12/29/23 0427 12/30/23 0829 12/31/23 0430  WBC 4.7 4.9 5.5 6.0   Microbiology Recent Results (from the past 240 hours)  Resp panel by RT-PCR (RSV, Flu A&B, Covid) Urine, Clean Catch     Status: None   Collection Time: 12/26/23 10:00 AM   Specimen: Urine, Clean Catch; Nasal Swab  Result Value Ref Range Status   SARS Coronavirus 2 by RT PCR NEGATIVE NEGATIVE Final   Influenza A by PCR NEGATIVE NEGATIVE Final   Influenza B by PCR NEGATIVE NEGATIVE Final    Comment: (NOTE) The Xpert Xpress SARS-CoV-2/FLU/RSV plus assay is intended as an aid in the diagnosis of influenza from Nasopharyngeal swab specimens and should not be used as a sole basis for treatment. Nasal washings and aspirates are unacceptable for Xpert Xpress SARS-CoV-2/FLU/RSV testing.  Fact Sheet for Patients: BloggerCourse.com  Fact Sheet for Healthcare Providers: SeriousBroker.it  This test is not yet approved or cleared by the [place]  FDA and has been authorized for detection and/or diagnosis of SARS-CoV-2 by FDA under an  Emergency Use Authorization (EUA). This EUA will remain in effect (meaning this test can be used) for the duration of the COVID-19 declaration under Section 564(b)(1) of the Act, 21 U.S.C. section 360bbb-3(b)(1), unless the authorization is terminated or revoked.     Resp Syncytial Virus by PCR NEGATIVE NEGATIVE Final    Comment: (NOTE) Fact Sheet for Patients: BloggerCourse.com  Fact Sheet for Healthcare Providers: SeriousBroker.it  This test is not yet approved or cleared by the [place]  FDA and has been authorized for detection and/or diagnosis of SARS-CoV-2 by FDA under an Emergency Use Authorization (EUA). This EUA will remain in effect (meaning this test can be used) for the duration of the COVID-19 declaration under Section 564(b)(1) of  the Act, 21 U.S.C. section 360bbb-3(b)(1), unless the authorization is terminated or revoked.  Performed at [facility], [address], [place], [place] [address]   Blood Culture (routine x 2)     Status: None   Collection Time: 12/26/23 10:01 AM   Specimen: BLOOD LEFT HAND  Result Value Ref Range Status   Specimen Description BLOOD LEFT HAND  Final   Special Requests   Final    AEROBIC BOTTLE ONLY Blood Culture results may not be optimal due to an inadequate volume of blood received in culture bottles   Culture   Final    NO GROWTH 5 DAYS Performed at [facility], [address], [place], [place] [address]    Report Status 12/31/2023 FINAL  Final  Blood Culture (routine x 2)     Status: None   Collection Time: 12/26/23 10:06 AM   Specimen: BLOOD LEFT HAND  Result Value Ref Range Status   Specimen Description BLOOD LEFT HAND  Final   Special Requests   Final    AEROBIC BOTTLE ONLY Blood Culture results may not be optimal due to an inadequate volume of blood received in culture bottles   Culture   Final    NO GROWTH 5 DAYS Performed at [facility], [address] [place], [place] [address]    Report Status 12/31/2023 FINAL  Final  MRSA Next Gen by PCR, Nasal     Status: None   Collection Time: 12/26/23  4:43 PM   Specimen: Nasal Mucosa; Nasal Swab  Result Value Ref Range Status   MRSA by PCR Next Gen NOT DETECTED NOT DETECTED Final    Comment: (NOTE) The GeneXpert MRSA Assay (FDA approved for NASAL specimens only), is one component of a comprehensive MRSA colonization surveillance program. It is not intended to diagnose MRSA infection nor to guide or monitor treatment for MRSA infections. Test performance is not FDA approved in patients less than 81 years old. Performed at [facility], [address] [place], [place] [address]      Patient was seen and examined on the day of discharge and was found to be in stable condition. Time  coordinating discharge: 35 minutes including assessment and coordination of care, as well as examination of the patient.   SIGNED:  Daren Eck, DO [facility] 12/31/2023, 1:06 PM

## 2023-12-31 NOTE — Discharge Instructions (Signed)

## 2024-02-16 ENCOUNTER — Other Ambulatory Visit: Payer: Self-pay | Admitting: Family Medicine

## 2024-02-16 DIAGNOSIS — M79659 Pain in unspecified thigh: Secondary | ICD-10-CM

## 2024-02-17 ENCOUNTER — Ambulatory Visit
Admission: RE | Admit: 2024-02-17 | Discharge: 2024-02-17 | Disposition: A | Discharge status: Home / Self Care | Source: Ambulatory Visit | Attending: Family Medicine | Admitting: Family Medicine

## 2024-02-17 DIAGNOSIS — M79659 Pain in unspecified thigh: Secondary | ICD-10-CM

## 2024-05-27 ENCOUNTER — Other Ambulatory Visit: Payer: Self-pay | Admitting: Family Medicine

## 2024-05-27 ENCOUNTER — Ambulatory Visit
Admission: RE | Admit: 2024-05-27 | Discharge: 2024-05-27 | Disposition: A | Discharge status: Home / Self Care | Source: Ambulatory Visit | Attending: Family Medicine | Admitting: Family Medicine

## 2024-05-27 DIAGNOSIS — M25551 Pain in right hip: Secondary | ICD-10-CM

## 2024-06-17 NOTE — Progress Notes (Signed)
 PATIENT: Jorge Martin DOB: 09/11/1935  REASON FOR VISIT: follow up HISTORY FROM: patient  Chief Complaint  Patient presents with   Follow-up    RM 1, alone. OSA f/u. Tolerating well, no issues. Received supplies a couple weeks ago.      HISTORY OF PRESENT ILLNESS:  06/22/2024 ALL:  Jorge Martin returns for follow up for OSA on CPAP. He reports doing well on therapy. He admits that he has not used his machine consistently. He denies concerns with machine or supplies. Recently received replacement supplies. He is sleeping well. He lives alone. Drives without difficulty. He is seeing [employer].      04/30/2022 ALL: Jorge Martin returns for follow up for OSA on CPAP. He continues to do well on therapy. His only difficulty is with falling asleep before starting therapy. He does note feeling better rested and more energized when using therapy. He denies concerns with supplies or machine.   90 day compliance review shows 73% daily and 61% 4 hour usage. AHI 2.3/hr. No leak.     02/26/2021 ALL:  He returns for follow up for OSA on CPAP. He reports that he is doing well. He continues to increase compliance. He is now 78% compliance with daily and 60% compliant with 4 hour use. He denies difficulty with machine or supplies. He states that he doesn't use it on nights when he has company over. He does feel that he sleeps better and wakes feeling rested when using CPAP.      05/11/2020 ALL:  Jorge Martin is a 89 y.o. male here today for follow up for OSA on CPAP therapy. He reports that he is doing well. He is getting more comfortable with therapy. He denies concerns with machine or supplies.   Compliance report dated 04/10/2020 through 05/09/2020 reveals that he used CPAP 18 of the past 30 days for compliance of 60%.  He used CPAP greater than 4 hours 14 of the last 30 days for compliance of 47%.  Average usage on days used was 5 hours and 59 minutes.  Residual AHI was 0.8 at a set pressure  of 9 cm of water  and EPR of 3.  There was no significant leak noted.  HISTORY: (copied from my note on 02/03/2020)  Jorge Martin is a 89 y.o. male here today for follow up for OSA on CPAP.  He reports that he is doing well on CPAP therapy.  He does feel better when he is using CPAP he has not had any difficulty with headaches.  He feels that he has more difficulty using CPAP if he is bothered by something.  Sometimes he lays in the bed and worries at night.  He admits that usage has been sporadic over the past 30 days.  He is motivated to continue using CPAP.  He understands risk factors of untreated sleep apnea.   Compliance report dated 01/03/2020 through 02/01/2020 reveals that he used CPAP 12 of the past 30 days for compliance of 40%.  He used CPAP greater than 4 hours 8 of the past 30 days for compliance of 27%.  Average usage on days used was 4 hours and 9 minutes.  Residual AHI was 2.3 on 9 cm of water  and an EPR of 3.  There was no significant leak noted.   HISTORY: (copied from my note on 11/03/2019)    Jorge Martin is a 89 y.o. male here today for follow up for OSA on CPAP. He  reports that he is doing well overall. He does admit that he has not used CPAP consistently over the past few months.  He denies any difficulty with his machine.  He has all the supplies he needs.  He just states that there are days he does not want to use it.  He states that he does not usually sleep for 4 hours each night.  He does not nap during the day.  He feels that he just does not need much sleep.  He does continue to have persistent headaches.  He feels that overall, headaches have improved.  He does have about 2 mild headaches a week.  These are easily treated with Tylenol .   Compliance report dated 08/04/2019 through 11/01/2019 reveals that he used CPAP 25 of the last 90 days.  He used CPAP 15 of the last 90 days for greater than 4 hours for compliance of 17%.  Average usage on days used was 4 hours and 27  minutes.  Residual AHI was 1.5 on 9 cm of water  and an EPR of 3.  There was no significant leak noted.   HISTORY: (copied from my note on 10/28/2018)   Jorge Martin is a 89 y.o. male here today for follow up. He has history of daily headaches and OSA. He reports that he is feeling well. He is using cpap regularly and feels benefit. Download reports shows that he is using CPAP 28/30 nights for compliance of 93%. He is only using CPAP greater than 4 hours 8/30 nights for compliance of 27%. His AHI is 0.8 on 9cm H2O with EPR of 3. There is no significant leak. He reports that he rarely sleeps for more than 3-4 hours at night. He has BPH and is up multiple times at night. He does states that he naps frequently during the day.    HISTORY: (copied from Dr Obie note on 04/01/2018)   Today, 04/01/2018: I reviewed his CPAP compliance data from 03/01/2018 through 03/30/2018, which is a total of 30 days, during which time he used his CPAP 99 days with percent used days greater than 4 hours at 57%, indicating suboptimal compliance with an average usage of 4 hours and 27 minutes residual AHI at goal at 0.8 per hour, leak acceptable with the 95th percentile at 13.2 L/m on a pressure of 10 cm with EPR of 2. In the past 90 days his compliance for more than 4 hours has been 42%. In the very first month of starting CPAP therapy his compliance was better, in fact between 02/05/2018 and 03/06/2018 his compliance for more than 4 hours was 90% which is excellent. He reports doing well. CPAP is working well for him. He reports an improvement in his sleep quality and daytime sleepiness. His headaches are better as well. He has had some issues tolerating the pressure and had some change in his humidity setting as moisture was accumulating in his nasal mask. Nevertheless, he struggles with nasal burning and has to take the mask off in the middle of the night, tries to put it back on later.   REVIEW OF SYSTEMS: Out of a  complete 14 system review of symptoms, the patient complains only of the following symptoms, none and all other reviewed systems are negative.  ESS: 11/24   ALLERGIES: No Known Allergies  HOME MEDICATIONS: Outpatient Medications Prior to Visit  Medication Sig Dispense Refill   amiodarone  (PACERONE ) 200 MG tablet Continue amiodarone  200 mg twice a day for 1  week (4/13-4/19) and then change to 200 mg once a day for 2 weeks (4/20-5/3) 22 tablet 0   amLODipine  (NORVASC ) 10 MG tablet Take 10 mg by mouth daily.     atorvastatin  (LIPITOR) 20 MG tablet Take 1 tablet by mouth every evening. (Patient taking differently: Take 20 mg by mouth at bedtime.) 90 tablet 3   Cyanocobalamin (VITAMIN B-12 PO) Take 1 tablet by mouth daily.     donepezil  (ARICEPT ) 10 MG tablet Take 10 mg by mouth at bedtime.     esomeprazole  (NEXIUM ) 40 MG capsule TAKE 1 CAPSULE BY MOUTH DAILY BEFORE BREAKFAST. (Patient taking differently: Take 40 mg by mouth daily.) 90 capsule 1   fluticasone (FLONASE) 50 MCG/ACT nasal spray Place 2 sprays into both nostrils daily.     losartan (COZAAR) 50 MG tablet Take 50 mg by mouth daily.     mirtazapine (REMERON) 15 MG tablet Take 15 mg by mouth at bedtime.     Multiple Vitamin (MULTIVITAMIN WITH MINERALS) TABS tablet Take 1 tablet by mouth daily.     thiamine  (VITAMIN B-1) 100 MG tablet Take 1 tablet (100 mg total) by mouth daily.     No facility-administered medications prior to visit.    PAST MEDICAL HISTORY: Past Medical History:  Diagnosis Date   Arthritis    ED (erectile dysfunction)    Elevated serum creatinine 11/2020   GERD (gastroesophageal reflux disease)    Headache    History of alcoholism (HCC)    02-17-2018  per pt quit 1999   History of gout 09/2011   right ankle   Hypercholesteremia    Hyperplasia of prostate with lower urinary tract symptoms (LUTS)    Hypertension    OSA on CPAP followed by dr buck   study 11-25-2017 moderate to severe osa (AHI 28.4/hr,   REM AHI 38/hr)   Pre-diabetes    Prostate cancer [facility]) urologist-  dr thalia  onologist-  dr patrcia   dx 11-18-2017  Stage T2a,  Gleason 4+3,  PSA 16.90,  vol 124cc-- treatment plan external beam radiation   Seasonal allergic rhinitis    Weak urinary stream     PAST SURGICAL HISTORY: Past Surgical History:  Procedure Laterality Date   CHOLECYSTECTOMY N/A 12/28/2023   Procedure: LAPAROSCOPIC CHOLECYSTECTOMY WITH INTRAOPERATIVE CHOLANGIOGRAM;  Surgeon: Polly Cordella LABOR, MD;  [facility];  Service: General;  Laterality: N/A;   GOLD SEED IMPLANT N/A 02/23/2018   Procedure: GOLD SEED IMPLANT;  Surgeon: Sherrilee Belvie CROME, MD;  Location[facility];  Service: Urology;  Laterality: N/A;   NO PAST SURGERIES     PROSTATE BIOPSY  11-18-2017  dr sherrilee  office   SPACE OAR INSTILLATION N/A 02/23/2018   Procedure: SPACE OAR INSTILLATION;  Surgeon: Sherrilee Belvie CROME, MD;  Location: [facility];  Service: Urology;  Laterality: N/A;    FAMILY HISTORY: Family History  Problem Relation Age of Onset   Hyperlipidemia Mother    Hyperlipidemia Father    Cancer Maternal Aunt        stomach    SOCIAL HISTORY: Social History   Socioeconomic History   Marital status: Widowed    Spouse name: Not on file   Number of children: 8   Years of education: 6th grade   Highest education level: Not on file  Occupational History   Not on file  Tobacco Use   Smoking status: Never   Smokeless tobacco: Never  Vaping Use   Vaping status: Never  Used  Substance and Sexual Activity   Alcohol use: Not Currently    Comment: hx heavy alcohol  -- quit 1999   Drug use: No   Sexual activity: Yes  Other Topics Concern   Not on file  Social History Narrative   He lives at home alone   Drinks 2 cups of coffee daily    Right handed   Social Drivers of Health   Financial Resource Strain: Not on file  Food Insecurity: No Food Insecurity (12/26/2023)   Hunger Vital Sign     Worried About Running Out of Food in the Last Year: Never true    Ran Out of Food in the Last Year: Never true  Transportation Needs: No Transportation Needs (12/26/2023)   PRAPARE - Administrator, Civil Service (Medical): No    Lack of Transportation (Non-Medical): No  Physical Activity: Not on file  Stress: Not on file  Social Connections: Moderately Integrated (12/26/2023)   Social Connection and Isolation Panel    Frequency of Communication with Friends and Family: Three times a week    Frequency of Social Gatherings with Friends and Family: Three times a week    Attends Religious Services: More than 4 times per year    Active Member of Clubs or Organizations: No    Attends Banker Meetings: More than 4 times per year    Marital Status: Widowed  Intimate Partner Violence: Not At Risk (12/26/2023)   Humiliation, Afraid, Rape, and Kick questionnaire    Fear of Current or Ex-Partner: No    Emotionally Abused: No    Physically Abused: No    Sexually Abused: No      PHYSICAL EXAM  Vitals:   06/22/24 0821 06/22/24 0826  BP: (!) 150/60 130/60  Pulse: 75   SpO2: 95%   Weight: 175 lb (79.4 kg)   Height: 5' 10 (1.778 m)      Body mass index is 25.11 kg/m.  Generalized: Well developed, in no acute distress  Cardiology: normal rate and rhythm, no murmur noted Respiratory: clear to auscultation bilaterally  Neurological examination  Mentation: Alert oriented to time, place, history taking. Follows all commands speech and language fluent Cranial nerve II-XII: Pupils were equal round reactive to light. Extraocular movements were full, visual field were full  Motor: The motor testing reveals 5 over 5 strength of all 4 extremities. Good symmetric motor tone is noted throughout.   Gait and station: Gait is normal. Posture slightly stooped     DIAGNOSTIC DATA (LABS, IMAGING, TESTING) - I reviewed patient records, labs, notes, testing and imaging myself  where available.      No data to display           Lab Results  Component Value Date   WBC 6.0 12/31/2023   HGB 8.7 (L) 12/31/2023   HCT 25.1 (L) 12/31/2023   MCV 90.3 12/31/2023   PLT 183 12/31/2023      Component Value Date/Time   NA 138 12/31/2023 0430   NA 141 11/13/2020 0928   K 3.7 12/31/2023 0430   CL 104 12/31/2023 0430   CO2 24 12/31/2023 0430   GLUCOSE 93 12/31/2023 0430   BUN 43 (H) 12/31/2023 0430   BUN 23 11/13/2020 0928   CREATININE 1.95 (H) 12/31/2023 0430   CREATININE 1.27 (H) 08/11/2017 1042   CALCIUM  7.5 (L) 12/31/2023 0430   PROT 5.6 (L) 12/31/2023 0912   PROT 6.9 11/13/2020 0928   ALBUMIN  2.8 (  L) 12/31/2023 0912   ALBUMIN  4.5 11/13/2020 0928   AST 147 (H) 12/31/2023 0912   ALT 439 (H) 12/31/2023 0912   ALKPHOS 106 12/31/2023 0912   BILITOT 1.8 (H) 12/31/2023 0912   BILITOT 0.6 11/13/2020 0928   GFRNONAA 33 (L) 12/31/2023 0430   GFRNONAA 53 (L) 08/11/2017 1042   GFRAA 49 (L) 11/05/2019 1540   GFRAA 61 08/11/2017 1042   Lab Results  Component Value Date   CHOL 154 11/13/2020   HDL 55 11/13/2020   LDLCALC 72 11/13/2020   TRIG 158 (H) 11/13/2020   CHOLHDL 2.8 11/13/2020   Lab Results  Component Value Date   HGBA1C 5.6 12/28/2023   Lab Results  Component Value Date   VITAMINB12 2,329 (H) 12/30/2023   Lab Results  Component Value Date   TSH 0.662 12/26/2023       ASSESSMENT AND PLAN 89 y.o. year old male  has a past medical history of Arthritis, ED (erectile dysfunction), Elevated serum creatinine (11/2020), GERD (gastroesophageal reflux disease), Headache, History of alcoholism (HCC), History of gout (09/2011), Hypercholesteremia, Hyperplasia of prostate with lower urinary tract symptoms (LUTS), Hypertension, OSA on CPAP (followed by dr buck), Pre-diabetes, Prostate cancer [facility]) (urologist-  dr thalia  onologist-  dr patrcia), Seasonal allergic rhinitis, and Weak urinary stream. here with     ICD-10-CM   1. OSA on CPAP  G47.33  For home use only DME continuous positive airway pressure (CPAP)      He reports doing well with CPAP therapy. Compliance is low, now 21% with daily and 17% with 4 hour use over past 90 days. He denies concerns with machine or supplies. No obvious barriers to use. He was encouraged to continue working towards using CPAP nightly and for greater than 4 hours each night. I will recheck compliance in 6-8 weeks. Risk of untreated sleep apnea discussed.  He will continue close follow-up with primary care.  He will follow-up with us  in 1 year, sooner if needed.  He verbalizes understanding and agreement with this plan.   Orders Placed This Encounter  Procedures   For home use only DME continuous positive airway pressure (CPAP)    Supplies    Length of Need:   Lifetime    Patient has OSA or probable OSA:   Yes    Is the patient currently using CPAP in the home:   Yes    Settings:   Other see comments    CPAP supplies needed:   Mask, headgear, cushions, filters, heated tubing and water  chamber      No orders of the defined types were placed in this encounter.   I personally spent a total of 30 minutes in the care of the patient today including preparing to see the patient, getting/reviewing separately obtained history, performing a medically appropriate exam/evaluation, counseling and educating, placing orders, documenting clinical information in the EHR, independently interpreting results, and communicating results.   Greig Forbes, FNP-C 06/22/2024, 8:49 AM [facility] [address] [place], [place] [address] [phone]

## 2024-06-17 NOTE — Patient Instructions (Addendum)
 Please continue using your CPAP regularly. While your insurance requires that you use CPAP at least 4 hours each night on 70% of the nights, I recommend, that you not skip any nights and use it throughout the night if you can. Getting used to CPAP and staying with the treatment long term does take time and patience and discipline. Untreated obstructive sleep apnea when it is moderate to severe can have an adverse impact on cardiovascular health and raise her risk for heart disease, arrhythmias, hypertension, congestive heart failure, stroke and diabetes. Untreated obstructive sleep apnea causes sleep disruption, nonrestorative sleep, and sleep deprivation. This can have an impact on your day to day functioning and cause daytime sleepiness and impairment of cognitive function, memory loss, mood disturbance, and problems focussing. Using CPAP regularly can improve these symptoms.  We will update supply orders, today. We will check your compliance report in 6-8 weeks to monitor usage. Please let know if you have any trouble with your machine.   Follow up in 1 year

## 2024-06-21 NOTE — Progress Notes (Unsigned)
 SABRA

## 2024-06-22 ENCOUNTER — Encounter: Payer: Self-pay | Admitting: Family Medicine

## 2024-06-22 ENCOUNTER — Ambulatory Visit: Payer: 59 | Admitting: Family Medicine

## 2024-06-22 VITALS — BP 130/60 | HR 75 | Ht 70.0 in | Wt 175.0 lb

## 2024-06-22 DIAGNOSIS — G4733 Obstructive sleep apnea (adult) (pediatric): Secondary | ICD-10-CM

## 2024-08-09 ENCOUNTER — Telehealth: Payer: Self-pay | Admitting: Family Medicine

## 2024-08-09 NOTE — Telephone Encounter (Signed)
 Please attach 90 day compliance download for review. TY!

## 2025-07-04 ENCOUNTER — Ambulatory Visit: Admitting: Family Medicine
